# Patient Record
Sex: Female | Born: 1965 | Race: Black or African American | Hispanic: No | Marital: Married | Smoking: Former smoker
Health system: Southern US, Community
[De-identification: ages and names within clinical notes are randomized; demographics above are authoritative.]

## PROBLEM LIST (undated history)

## (undated) DIAGNOSIS — I10 Essential (primary) hypertension: Secondary | ICD-10-CM

## (undated) DIAGNOSIS — Z972 Presence of dental prosthetic device (complete) (partial): Secondary | ICD-10-CM

## (undated) DIAGNOSIS — Z860101 Personal history of adenomatous and serrated colon polyps: Secondary | ICD-10-CM

## (undated) DIAGNOSIS — Z86010 Personal history of colonic polyps: Secondary | ICD-10-CM

## (undated) DIAGNOSIS — Z973 Presence of spectacles and contact lenses: Secondary | ICD-10-CM

## (undated) DIAGNOSIS — K760 Fatty (change of) liver, not elsewhere classified: Secondary | ICD-10-CM

## (undated) DIAGNOSIS — M329 Systemic lupus erythematosus, unspecified: Secondary | ICD-10-CM

## (undated) DIAGNOSIS — M069 Rheumatoid arthritis, unspecified: Secondary | ICD-10-CM

## (undated) DIAGNOSIS — R7989 Other specified abnormal findings of blood chemistry: Secondary | ICD-10-CM

## (undated) HISTORY — DX: Rheumatoid arthritis, unspecified: M06.9

## (undated) HISTORY — DX: Essential (primary) hypertension: I10

## (undated) HISTORY — PX: CHOLECYSTECTOMY: SHX55

---

## 1995-12-30 HISTORY — PX: LAPAROSCOPIC CHOLECYSTECTOMY: SUR755

## 1998-10-29 ENCOUNTER — Other Ambulatory Visit: Admission: RE | Admit: 1998-10-29 | Discharge: 1998-10-29 | Payer: Self-pay | Admitting: Obstetrics and Gynecology

## 1999-09-09 ENCOUNTER — Encounter: Admission: RE | Admit: 1999-09-09 | Discharge: 1999-09-18 | Payer: Self-pay | Admitting: *Deleted

## 1999-11-05 ENCOUNTER — Other Ambulatory Visit: Admission: RE | Admit: 1999-11-05 | Discharge: 1999-11-05 | Payer: Self-pay | Admitting: Obstetrics and Gynecology

## 1999-12-03 ENCOUNTER — Encounter (INDEPENDENT_AMBULATORY_CARE_PROVIDER_SITE_OTHER): Payer: Self-pay | Admitting: Specialist

## 1999-12-03 ENCOUNTER — Other Ambulatory Visit: Admission: RE | Admit: 1999-12-03 | Discharge: 1999-12-03 | Payer: Self-pay | Admitting: Obstetrics and Gynecology

## 2000-01-01 ENCOUNTER — Other Ambulatory Visit: Admission: RE | Admit: 2000-01-01 | Discharge: 2000-01-01 | Payer: Self-pay | Admitting: Obstetrics and Gynecology

## 2000-01-01 ENCOUNTER — Encounter (INDEPENDENT_AMBULATORY_CARE_PROVIDER_SITE_OTHER): Payer: Self-pay

## 2000-04-06 ENCOUNTER — Other Ambulatory Visit: Admission: RE | Admit: 2000-04-06 | Discharge: 2000-04-06 | Payer: Self-pay | Admitting: Obstetrics and Gynecology

## 2000-11-26 ENCOUNTER — Other Ambulatory Visit: Admission: RE | Admit: 2000-11-26 | Discharge: 2000-11-26 | Payer: Self-pay | Admitting: Obstetrics and Gynecology

## 2000-12-17 ENCOUNTER — Other Ambulatory Visit: Admission: RE | Admit: 2000-12-17 | Discharge: 2000-12-17 | Payer: Self-pay | Admitting: Obstetrics and Gynecology

## 2001-02-09 ENCOUNTER — Other Ambulatory Visit: Admission: RE | Admit: 2001-02-09 | Discharge: 2001-02-09 | Payer: Self-pay | Admitting: Obstetrics and Gynecology

## 2001-05-28 ENCOUNTER — Emergency Department (HOSPITAL_COMMUNITY): Admission: EM | Admit: 2001-05-28 | Discharge: 2001-05-28 | Payer: Self-pay | Admitting: Emergency Medicine

## 2001-12-16 ENCOUNTER — Other Ambulatory Visit: Admission: RE | Admit: 2001-12-16 | Discharge: 2001-12-16 | Payer: Self-pay | Admitting: Obstetrics and Gynecology

## 2004-02-01 ENCOUNTER — Other Ambulatory Visit: Admission: RE | Admit: 2004-02-01 | Discharge: 2004-02-01 | Payer: Self-pay | Admitting: Family Medicine

## 2006-08-08 ENCOUNTER — Emergency Department (HOSPITAL_COMMUNITY): Admission: EM | Admit: 2006-08-08 | Discharge: 2006-08-08 | Payer: Self-pay | Admitting: *Deleted

## 2008-09-01 ENCOUNTER — Emergency Department (HOSPITAL_COMMUNITY): Admission: EM | Admit: 2008-09-01 | Discharge: 2008-09-01 | Payer: Self-pay | Admitting: Emergency Medicine

## 2010-12-11 ENCOUNTER — Encounter
Admission: RE | Admit: 2010-12-11 | Discharge: 2010-12-28 | Payer: Self-pay | Source: Home / Self Care | Attending: Internal Medicine | Admitting: Internal Medicine

## 2010-12-28 ENCOUNTER — Encounter: Admission: RE | Admit: 2010-12-28 | Payer: Self-pay | Source: Home / Self Care | Admitting: Internal Medicine

## 2011-01-08 ENCOUNTER — Encounter: Admit: 2011-01-08 | Payer: Self-pay | Admitting: Internal Medicine

## 2011-01-14 ENCOUNTER — Encounter
Admit: 2011-01-14 | Discharge: 2011-01-28 | Payer: Self-pay | Attending: Internal Medicine | Admitting: Internal Medicine

## 2011-01-29 ENCOUNTER — Ambulatory Visit: Payer: Self-pay | Admitting: Rehabilitation

## 2011-01-30 ENCOUNTER — Encounter: Payer: Self-pay | Admitting: Rehabilitation

## 2011-02-04 ENCOUNTER — Ambulatory Visit: Payer: Worker's Compensation | Admitting: Rehabilitation

## 2011-02-06 ENCOUNTER — Encounter: Payer: Worker's Compensation | Admitting: Rehabilitation

## 2016-12-29 DIAGNOSIS — M329 Systemic lupus erythematosus, unspecified: Secondary | ICD-10-CM

## 2016-12-29 DIAGNOSIS — IMO0002 Reserved for concepts with insufficient information to code with codable children: Secondary | ICD-10-CM

## 2016-12-29 HISTORY — DX: Reserved for concepts with insufficient information to code with codable children: IMO0002

## 2016-12-29 HISTORY — DX: Systemic lupus erythematosus, unspecified: M32.9

## 2017-05-08 DIAGNOSIS — R768 Other specified abnormal immunological findings in serum: Secondary | ICD-10-CM | POA: Insufficient documentation

## 2017-05-08 NOTE — Progress Notes (Signed)
Office Visit Note  Patient: Shelley Morrison             Date of Birth: 02-09-1966           MRN: [id number]             PCP: Javier Docker, MD Referring: Javier Docker, MD Visit Date: 05/19/2017 Occupation: CNA-medication tech    Subjective:  New Patient (Initial Visit) and Joint Pain   History of Present Illness: Shelley Morrison is a 51 y.o. female seen in consultation per request of her PCP. According to patient in September 2017 while she was sitting in the sun she developed a rash on her upper extremity which lasted for a while she also developed a rash on her lower extremities. She states 2 weeks after that she started experiencing joint pain all over. The rash eventually resolved. But this stiffness persists. She states last month while she was sitting in the sun she developed some rash over her left forearm. She got some pictures of the rash on her cell phone which appears to be a macular papular rash. She states the rash was pruritic and lasted less than 24 hours. She's been experiencing joint swelling in her bilateral hands, bilateral feet and her ankles. She's describes arthralgias in her shoulder joints, elbow joints, wrist joints, hands, hip joints.  Activities of Daily Living:  Patient reports morning stiffness for all day hours.   Patient Denies nocturnal pain.  Difficulty dressing/grooming: Reports Difficulty climbing stairs: Reports Difficulty getting out of chair: Reports Difficulty using hands for taps, buttons, cutlery, and/or writing: Reports   Review of Systems  Constitutional: Positive for fatigue and weight loss. Negative for night sweats, weight gain and weakness.       Weight loss of 12 pounds in the last 6 months  HENT: Positive for mouth dryness. Negative for mouth sores, trouble swallowing, trouble swallowing and nose dryness.   Eyes: Positive for dryness. Negative for pain, redness and visual disturbance.  Respiratory: Negative for cough, shortness  of breath and difficulty breathing.   Cardiovascular: Positive for hypertension. Negative for chest pain, palpitations, irregular heartbeat and swelling in legs/feet.  Gastrointestinal: Negative for blood in stool, constipation and diarrhea.  Endocrine: Negative for increased urination.  Genitourinary: Negative for vaginal dryness.  Musculoskeletal: Positive for arthralgias, joint pain, joint swelling, myalgias, morning stiffness and myalgias. Negative for muscle weakness and muscle tenderness.  Skin: Positive for sensitivity to sunlight. Negative for color change, hair loss, skin tightness and ulcers.  Allergic/Immunologic: Negative for susceptible to infections.  Neurological: Negative for dizziness, memory loss and night sweats.  Hematological: Negative for swollen glands.  Psychiatric/Behavioral: Positive for depressed mood and sleep disturbance. The patient is nervous/anxious.     PMFS History:  Patient Active Problem List   Diagnosis Date Noted  . Primary insomnia 05/19/2017  . Essential hypertension 05/19/2017  . ANA positive 05/08/2017  . Rheumatoid factor positive 05/08/2017    Past Medical History:  Diagnosis Date  . Hypertension     No family history on file. Past Surgical History:  Procedure Laterality Date  . CHOLECYSTECTOMY     Social History   Social History Narrative  . No narrative on file     Objective: Vital Signs: BP 138/84   Pulse 80   Resp 16   Ht 5' 10.5" (1.791 m)   Wt 196 lb (88.9 kg)   LMP 05/01/2017   BMI 27.73 kg/m    Physical Exam  Constitutional: She is oriented to person, place, and time. She appears well-developed and well-nourished.  HENT:  Head: Normocephalic and atraumatic.  Eyes: Conjunctivae and EOM are normal.  Neck: Normal range of motion.  Cardiovascular: Normal rate, regular rhythm, normal heart sounds and intact distal pulses.   Pulmonary/Chest: Effort normal and breath sounds normal.  Abdominal: Soft. Bowel sounds are  normal.  Lymphadenopathy:    She has no cervical adenopathy.  Neurological: She is alert and oriented to person, place, and time.  Skin: Skin is warm and dry. Capillary refill takes less than 2 seconds.  Psychiatric: She has a normal mood and affect. Her behavior is normal.  Nursing note and vitals reviewed.    Musculoskeletal Exam: C-spine and thoracic spine good range of motion. Lumbar spine limitation of range of motion. She has some discomfort range of motion of her right shoulder tenderness on palpation over her right elbow, she had synovitis on MCPs and PIPs as described below. Hip joints are good range of motion without discomfort she has discomfort range of motion of bilateral knee joints without any warmth swelling or effusion she has tenderness over bilateral ankle joints. No MTP tenderness or synovitis was noted.  CDAI Exam: CDAI Homunculus Exam:   Tenderness:  RUE: glenohumeral and ulnohumeral and radiohumeral Right hand: 2nd MCP, 2nd PIP, 3rd PIP and 4th PIP RLE: tibiofemoral and tibiotalar LLE: tibiotalar  Swelling:  RUE: ulnohumeral and radiohumeral Right hand: 2nd MCP, 2nd PIP, 3rd PIP and 4th PIP RLE: tibiotalar LLE: tibiotalar  Joint Counts:  CDAI Tender Joint count: 7 CDAI Swollen Joint count: 5  Global Assessments:  Patient Global Assessment: 8 Provider Global Assessment: 7  CDAI Calculated Score: 27   Investigation: Findings:  02/04/17 CMP shows elevated Glucose 115, otherwise normal, ESR Elevated 58, RF elevated 406, TSH normal 1.02, ANA positive 1:320 titer homogeneous pattern    Imaging: Xr Foot 2 Views Left  Result Date: 05/19/2017 Right first MTP minimal narrowing no erosive changes were noted. Minimal PIP/DIP narrowing was noted. Inferior calcaneal spur was noted. Impression: These findings are consistent with osteoarthritis of the foo  Xr Foot 2 Views Right  Result Date: 05/19/2017 Right first MTP minimal narrowing no erosive changes were  noted. Minimal PIP/DIP narrowing was noted. Inferior and posterior calcaneal spur was noted. Impression: These findings are consistent with osteoarthritis of the foot  Xr Hand 2 View Left  Result Date: 05/19/2017 Mild juxta articular osteopenia was noted. No MCP joint narrowing or erosive changes were noted. No intercarpal joint space narrowing or erosive changes were noted. She has some PIP narrowing. All CMC narrowing was noted.  Xr Hand 2 View Right  Result Date: 05/19/2017 Mild juxta articular osteopenia was noted. No MCP joint narrowing or erosive changes were noted. No intercarpal joint space narrowing or erosive changes were noted. She has some PIP narrowing. All CMC narrowing was noted.  Xr Knee 3 View Left  Result Date: 05/19/2017 Moderate medial compartment narrowing was noted with medial and intercondylar osteophytes. No chondrocalcinosis was noted. Moderate patellofemoral narrowing was noted. 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 No chondrocalcinosis was noted. Mild patellofemoral narrowing was noted. Impression: Findings are consistent with moderate osteoarthritis and mild chondromalacia patella  Xr Shoulder Right  Result Date: 05/19/2017 No glenohumeral joint space narrowing was noted. A small acromial spur was noted. No  chondrocalcinosis was noted.   Speciality Comments: No specialty comments available.    Procedures:  No procedures performed Allergies: Patient has no known allergies.   Assessment / Plan:     Visit Diagnoses: ANA positive -patient gives history of arthralgia arthritis and photosensitivity. She had active synovitis on examination today. We had detailed discussion regarding possibility of autoimmune disease like lupus. Although she does not be the criteria  at this point. I've also advised her to use sunscreen due to photosensitivity. Plan: CBC with Differential/Platelet, Urinalysis, Routine w reflex microscopic, Cyclic citrul peptide antibody, IgG, [id number] ENA PANEL, C3 and C4, Cardiolipin antibodies, IgG, IgM, IgA, Lupus anticoagulant panel, Beta-2 glycoprotein antibodies, Glucose 6 phosphate dehydrogenase, Serum protein electrophoresis with reflex, IgG, IgA, IgM  Rheumatoid factor positive. She is significantly high positive rheumatoid factor and has active synovitis on examination. I would like to start her on Plaquenil. Indications side effects contraindications were discussed at length. The plan is to start her on Plaquenil 200 mg by mouth twice a day. She will also need baseline ocular examination to monitor for ocular toxicity and then yearly exam. She's been advised to get pneumococcal vaccine and shingles vaccine. - Plan: CBC with Differential/Platelet  Pain in joint, multiple sites - Plan: CBC with Differential/Platelet, Urinalysis, Routine w reflex microscopic, Cyclic citrul peptide antibody, IgG, [id number] ENA PANEL, C3 and C4, Cardiolipin antibodies, IgG, IgM, IgA, Lupus anticoagulant panel, Beta-2 glycoprotein antibodies, Glucose 6 phosphate dehydrogenase, Serum protein electrophoresis with reflex, IgG, IgA, IgM, Hepatitis panel, acute  Chronic right shoulder pain - Plan: XR Shoulder Right  Bilateral hand pain - Plan: XR Hand 2 View Left, XR Hand 2 View Right  Chronic pain of both knees - Plan: XR KNEE 3 VIEW RIGHT, XR KNEE 3 VIEW LEFT  Foot pain, bilateral - Plan: XR Foot 2 Views Left, XR Foot 2 Views Right  Other fatigue - VITAMIN D 25 Hydroxy (Vit-D Deficiency, Fractures)  Primary insomnia  Essential hypertension  Encounter for vitamin deficiency screening - Plan: VITAMIN D 25 Hydroxy (Vit-D Deficiency, Fractures)  Encounter for screening for HIV - Plan: HIV antibody  Need for hepatitis B screening test  Need for hepatitis  C screening test  Screening for blood disease - Plan: Glucose 6 phosphate dehydrogenase  Screening for tuberculosis - Plan: Quantiferon tb gold assay (blood)    Orders: Orders Placed This Encounter  Procedures  . XR Shoulder Right  . XR Hand 2 View Left  . XR Hand 2 View Right  . XR KNEE 3 VIEW RIGHT  . XR KNEE 3 VIEW LEFT  . XR Foot 2 Views Left  . XR Foot 2 Views Right  . CBC with Differential/Platelet  . Urinalysis, Routine w reflex microscopic  . Cyclic citrul peptide antibody, IgG  . [id number] ENA PANEL  . C3 and C4  . Cardiolipin antibodies, IgG, IgM, IgA  . Lupus anticoagulant panel  . Beta-2 glycoprotein antibodies  . Glucose 6 phosphate dehydrogenase  . Serum protein electrophoresis with reflex  . IgG, IgA, IgM  . Quantiferon tb gold assay (blood)  . Hepatitis panel, acute  . VITAMIN D 25 Hydroxy (Vit-D Deficiency, Fractures)  . HIV antibody  . CBC with Differential/Platelet  . COMPLETE METABOLIC PANEL WITH GFR   Meds ordered this encounter  Medications  . hydroxychloroquine (PLAQUENIL) 200 MG tablet    Sig: Take 1 tablet (200 mg total) by mouth 2 (two) times daily.    Dispense:  60 tablet    Refill:  2    Face-to-face time spent with patient was 60 minutes. 50% of time was spent in counseling and coordination of care.  Follow-Up Instructions: No Follow-up on file.   Bo Merino, MD  Note - This record has been created using Editor, commissioning.  Chart creation errors have been sought, but may not always  have been located. Such creation errors do not reflect on  the standard of medical care.

## 2017-05-19 ENCOUNTER — Ambulatory Visit (INDEPENDENT_AMBULATORY_CARE_PROVIDER_SITE_OTHER): Payer: BLUE CROSS/BLUE SHIELD

## 2017-05-19 ENCOUNTER — Ambulatory Visit (INDEPENDENT_AMBULATORY_CARE_PROVIDER_SITE_OTHER): Payer: BLUE CROSS/BLUE SHIELD | Admitting: Rheumatology

## 2017-05-19 ENCOUNTER — Encounter: Payer: Self-pay | Admitting: Rheumatology

## 2017-05-19 VITALS — BP 138/84 | HR 80 | Resp 16 | Ht 70.5 in | Wt 196.0 lb

## 2017-05-19 DIAGNOSIS — M2550 Pain in unspecified joint: Secondary | ICD-10-CM

## 2017-05-19 DIAGNOSIS — M79672 Pain in left foot: Secondary | ICD-10-CM | POA: Diagnosis not present

## 2017-05-19 DIAGNOSIS — M79642 Pain in left hand: Secondary | ICD-10-CM

## 2017-05-19 DIAGNOSIS — M25561 Pain in right knee: Secondary | ICD-10-CM

## 2017-05-19 DIAGNOSIS — Z111 Encounter for screening for respiratory tuberculosis: Secondary | ICD-10-CM

## 2017-05-19 DIAGNOSIS — M79671 Pain in right foot: Secondary | ICD-10-CM

## 2017-05-19 DIAGNOSIS — Z130 Encounter for screening for diseases of the blood and blood-forming organs and certain disorders involving the immune mechanism: Secondary | ICD-10-CM

## 2017-05-19 DIAGNOSIS — M25511 Pain in right shoulder: Secondary | ICD-10-CM | POA: Diagnosis not present

## 2017-05-19 DIAGNOSIS — M79641 Pain in right hand: Secondary | ICD-10-CM

## 2017-05-19 DIAGNOSIS — R768 Other specified abnormal immunological findings in serum: Secondary | ICD-10-CM

## 2017-05-19 DIAGNOSIS — M25562 Pain in left knee: Secondary | ICD-10-CM | POA: Diagnosis not present

## 2017-05-19 DIAGNOSIS — I10 Essential (primary) hypertension: Secondary | ICD-10-CM | POA: Diagnosis not present

## 2017-05-19 DIAGNOSIS — Z79899 Other long term (current) drug therapy: Secondary | ICD-10-CM

## 2017-05-19 DIAGNOSIS — R5383 Other fatigue: Secondary | ICD-10-CM

## 2017-05-19 DIAGNOSIS — Z114 Encounter for screening for human immunodeficiency virus [HIV]: Secondary | ICD-10-CM

## 2017-05-19 DIAGNOSIS — Z1159 Encounter for screening for other viral diseases: Secondary | ICD-10-CM

## 2017-05-19 DIAGNOSIS — G8929 Other chronic pain: Secondary | ICD-10-CM

## 2017-05-19 DIAGNOSIS — F5101 Primary insomnia: Secondary | ICD-10-CM | POA: Insufficient documentation

## 2017-05-19 DIAGNOSIS — Z1321 Encounter for screening for nutritional disorder: Secondary | ICD-10-CM

## 2017-05-19 LAB — CBC WITH DIFFERENTIAL/PLATELET
BASOS PCT: 1 %
Basophils Absolute: 65 cells/uL (ref 0–200)
EOS PCT: 2 %
Eosinophils Absolute: 130 cells/uL (ref 15–500)
HEMATOCRIT: 38.2 % (ref 35.0–45.0)
Hemoglobin: 11.6 g/dL — ABNORMAL LOW (ref 11.7–15.5)
LYMPHS PCT: 36 %
Lymphs Abs: 2340 cells/uL (ref 850–3900)
MCH: 26.1 pg — ABNORMAL LOW (ref 27.0–33.0)
MCHC: 30.4 g/dL — AB (ref 32.0–36.0)
MCV: 86 fL (ref 80.0–100.0)
MONO ABS: 520 {cells}/uL (ref 200–950)
MONOS PCT: 8 %
MPV: 9.7 fL (ref 7.5–12.5)
NEUTROS PCT: 53 %
Neutro Abs: 3445 cells/uL (ref 1500–7800)
PLATELETS: 334 10*3/uL (ref 140–400)
RBC: 4.44 MIL/uL (ref 3.80–5.10)
RDW: 16 % — AB (ref 11.0–15.0)
WBC: 6.5 10*3/uL (ref 3.8–10.8)

## 2017-05-19 MED ORDER — HYDROXYCHLOROQUINE SULFATE 200 MG PO TABS: 200.0000 mg | ORAL_TABLET | Freq: Two times a day (BID) | ORAL | 2 refills | Status: DC

## 2017-05-19 NOTE — Patient Instructions (Addendum)
Dr Corliss Skains wants you to see your primary care about getting pneumonia vaccine and Shingles vaccine. You will need these prior to starting immunosuppression therapy.  You also need to see your eye doctor for your baseline eye exam.    Standing Labs We placed an order today for your standing lab work.    Please come back and get your standing labs in 1 month then in 3 months  We have open lab Monday through Friday from 8:30-11:30 AM and 1:30-4 PM at the office of Dr. Arbutus Ped, PA.   The office is located at [address], [place], [place] [address] No appointment is necessary.   Labs are drawn by [facility].  You may receive a bill from [facility] for your lab work. If you have any questions regarding directions or hours of operation,  please call [phone].    Hydroxychloroquine tablets What is this medicine? HYDROXYCHLOROQUINE (hye drox ee KLOR oh kwin) is used to treat rheumatoid arthritis and systemic lupus erythematosus. It is also used to treat malaria. This medicine may be used for other purposes; ask your health care provider or pharmacist if you have questions. COMMON BRAND NAME(S): Plaquenil, Quineprox What should I tell my health care provider before I take this medicine? They need to know if you have any of these conditions: -diabetes -eye disease, vision problems -G6PD deficiency -history of blood diseases -history of irregular heartbeat -if you often drink alcohol -kidney disease -liver disease -porphyria -psoriasis -seizures -an unusual or allergic reaction to chloroquine, hydroxychloroquine, other medicines, foods, dyes, or preservatives -pregnant or trying to get pregnant -breast-feeding How should I use this medicine? Take this medicine by mouth with a glass of water. Follow the directions on the prescription label. Avoid taking antacids within 4 hours of taking this medicine. It is best to separate these medicines by at least 4  hours. Do not cut, crush or chew this medicine. You can take it with or without food. If it upsets your stomach, take it with food. Take your medicine at regular intervals. Do not take your medicine more often than directed. Take all of your medicine as directed even if you think you are better. Do not skip doses or stop your medicine early. Talk to your pediatrician regarding the use of this medicine in children. While this drug may be prescribed for selected conditions, precautions do apply. Overdosage: If you think you have taken too much of this medicine contact a poison control center or emergency room at once. NOTE: This medicine is only for you. Do not share this medicine with others. What if I miss a dose? If you miss a dose, take it as soon as you can. If it is almost time for your next dose, take only that dose. Do not take double or extra doses. What may interact with this medicine? Do not take this medicine with any of the following medications: -cisapride -dofetilide -dronedarone -live virus vaccines -penicillamine -pimozide -thioridazine -ziprasidone This medicine may also interact with the following medications: -ampicillin -antacids -cimetidine -cyclosporine -digoxin -medicines for diabetes, like insulin, glipizide, glyburide -medicines for seizures like carbamazepine, phenobarbital, phenytoin -mefloquine -methotrexate -other medicines that prolong the QT interval (cause an abnormal heart rhythm) -praziquantel This list may not describe all possible interactions. Give your health care provider a list of all the medicines, herbs, non-prescription drugs, or dietary supplements you use. Also tell them if you smoke, drink alcohol, or use illegal drugs. Some items may interact with your medicine.  What should I watch for while using this medicine? Tell your doctor or healthcare professional if your symptoms do not start to get better or if they get worse. Avoid taking  antacids within 4 hours of taking this medicine. It is best to separate these medicines by at least 4 hours. Tell your doctor or health care professional right away if you have any change in your eyesight. Your vision and blood may be tested before and during use of this medicine. This medicine can make you more sensitive to the sun. Keep out of the sun. If you cannot avoid being in the sun, wear protective clothing and use sunscreen. Do not use sun lamps or tanning beds/booths. What side effects may I notice from receiving this medicine? Side effects that you should report to your doctor or health care professional as soon as possible: -allergic reactions like skin rash, itching or hives, swelling of the face, lips, or tongue -changes in vision -decreased hearing or ringing of the ears -redness, blistering, peeling or loosening of the skin, including inside the mouth -seizures -sensitivity to light -signs and symptoms of a dangerous change in heartbeat or heart rhythm like chest pain; dizziness; fast or irregular heartbeat; palpitations; feeling faint or lightheaded, falls; breathing problems -signs and symptoms of liver injury like dark yellow or brown urine; general ill feeling or flu-like symptoms; light-colored stools; loss of appetite; nausea; right upper belly pain; unusually weak or tired; yellowing of the eyes or skin -signs and symptoms of low blood sugar such as feeling anxious; confusion; dizziness; increased hunger; unusually weak or tired; sweating; shakiness; cold; irritable; headache; blurred vision; fast heartbeat; loss of consciousness -uncontrollable head, mouth, neck, arm, or leg movements Side effects that usually do not require medical attention (report to your doctor or health care professional if they continue or are bothersome): -anxious -diarrhea -dizziness -hair loss -headache -irritable -loss of appetite -nausea, vomiting -stomach pain This list may not describe  all possible side effects. Call your doctor for medical advice about side effects. You may report side effects to FDA at 1-800-FDA-1088. Where should I keep my medicine? Keep out of the reach of children. In children, this medicine can cause overdose with small doses. Store at room temperature between 15 and 30 degrees C (59 and 86 degrees F). Protect from moisture and light. Throw away any unused medicine after the expiration date. NOTE: This sheet is a summary. It may not cover all possible information. If you have questions about this medicine, talk to your doctor, pharmacist, or health care provider.  2018 Elsevier/Gold Standard (2016-07-30 14:16:15)

## 2017-05-19 NOTE — Progress Notes (Signed)
Pharmacy Note  Subjective: Patient presents today to the [facility] to see Dr. Corliss Skainseveshwar.  Patient seen by the pharmacist for counseling on hydroxychloroquine.    Objective: 02/04/17 CMP shows elevated Glucose 115, otherwise normal CBC - ordered today  Assessment/Plan: Patient was prescribed hydroxychloroquine 200 mg twice daily.  Patient was counseled on the purpose, proper use, and adverse effects of hydroxychloroquine including nausea/diarrhea, skin rash, headaches, and sun sensitivity.  Discussed importance of annual eye exams while on hydroxychloroquine to monitor to ocular toxicity and discussed importance of frequent laboratory monitoring.  Provided patient with eye exam form for baseline ophthalmologic exam and standing lab instructions.  Provided patient with educational materials on hydroxychloroquine and answered all questions.  Patient consented to hydroxychloroquine.  Will upload consent in the media tab.    Lilla Shookachel Rona Tomson, Pharm.D., BCPS Clinical Pharmacist Pager: [phone] Phone: [phone] 05/19/2017 9:33 AM

## 2017-05-20 LAB — HEPATITIS PANEL, ACUTE
HCV Ab: NEGATIVE
HEP A IGM: NONREACTIVE
HEP B S AG: NEGATIVE
Hep B C IgM: NONREACTIVE

## 2017-05-20 LAB — GLUCOSE 6 PHOSPHATE DEHYDROGENASE: G-6PDH: 18.6 U/g Hgb (ref 7.0–20.5)

## 2017-05-20 LAB — IGG, IGA, IGM
IgA: 390 mg/dL (ref 81–463)
IgG (Immunoglobin G), Serum: 1984 mg/dL — ABNORMAL HIGH (ref 694–1618)
IgM, Serum: 195 mg/dL (ref 48–271)

## 2017-05-20 LAB — CYCLIC CITRUL PEPTIDE ANTIBODY, IGG

## 2017-05-20 LAB — CARDIOLIPIN ANTIBODIES, IGG, IGM, IGA: Anticardiolipin IgM: <12 [MPL'U]

## 2017-05-20 LAB — CP5000020 ENA PANEL
ENA SM Ab Ser-aCnc: <1
RIBONUCLEIC PROTEIN(ENA) ANTIBODY, IGG: NEGATIVE
SSA (Ro) (ENA) Antibody, IgG: <1
SSB (LA) (ENA) ANTIBODY, IGG: NEGATIVE
Scleroderma (Scl-70) (ENA) Antibody, IgG: <1

## 2017-05-20 LAB — C3 AND C4
C3 Complement: 154 mg/dL (ref 83–193)
C4 COMPLEMENT: 51 mg/dL (ref 15–57)

## 2017-05-20 LAB — HIV ANTIBODY (ROUTINE TESTING W REFLEX): HIV 1&2 Ab, 4th Generation: NONREACTIVE

## 2017-05-20 LAB — VITAMIN D 25 HYDROXY (VIT D DEFICIENCY, FRACTURES): VIT D 25 HYDROXY: 11 ng/mL — AB (ref 30–100)

## 2017-05-21 LAB — RFX PTT-LA W/RFX TO HEX PHASE CONF: PTT-LA Screen: 34 s (ref <=40)

## 2017-05-21 LAB — LUPUS ANTICOAGULANT PANEL

## 2017-05-21 LAB — PROTEIN ELECTROPHORESIS, SERUM, WITH REFLEX
Albumin ELP: 3.6 g/dL — ABNORMAL LOW (ref 3.8–4.8)
Alpha-1-Globulin: 0.4 g/dL — ABNORMAL HIGH (ref 0.2–0.3)
Alpha-2-Globulin: 0.7 g/dL (ref 0.5–0.9)
BETA 2: 0.6 g/dL — AB (ref 0.2–0.5)
BETA GLOBULIN: 0.5 g/dL (ref 0.4–0.6)
Gamma Globulin: 1.8 g/dL — ABNORMAL HIGH (ref 0.8–1.7)
TOTAL PROTEIN, SERUM ELECTROPHOR: 7.7 g/dL (ref 6.1–8.1)

## 2017-05-21 LAB — QUANTIFERON TB GOLD ASSAY (BLOOD)
INTERFERON GAMMA RELEASE ASSAY: NEGATIVE
Mitogen-Nil: 8.93 IU/mL
QUANTIFERON TB AG MINUS NIL: 0.01 [IU]/mL
Quantiferon Nil Value: 0.04 IU/mL

## 2017-05-21 LAB — RFX DRVVT SCR W/RFLX CONF 1:1 MIX: DRVVT SCREEN: 33 s (ref <=45)

## 2017-05-21 LAB — BETA-2 GLYCOPROTEIN ANTIBODIES
Beta-2-Glycoprotein I IgA: <9 SAU (ref <=20)
Beta-2-Glycoprotein I IgM: <9 SMU (ref <=20)

## 2017-05-22 NOTE — Progress Notes (Signed)
Labs are consistent with rheumatoid arthritis. For vitamin D deficiency please call and 50,000 units twice a week for 3 months and check vitamin D levels in 3 months

## 2017-05-27 ENCOUNTER — Telehealth: Payer: Self-pay | Admitting: Rheumatology

## 2017-05-27 ENCOUNTER — Emergency Department (HOSPITAL_COMMUNITY): Payer: BLUE CROSS/BLUE SHIELD

## 2017-05-27 ENCOUNTER — Encounter (HOSPITAL_COMMUNITY): Payer: Self-pay | Admitting: Emergency Medicine

## 2017-05-27 ENCOUNTER — Telehealth: Payer: Self-pay | Admitting: *Deleted

## 2017-05-27 DIAGNOSIS — Z79899 Other long term (current) drug therapy: Secondary | ICD-10-CM | POA: Diagnosis not present

## 2017-05-27 DIAGNOSIS — R0789 Other chest pain: Secondary | ICD-10-CM | POA: Diagnosis not present

## 2017-05-27 DIAGNOSIS — I10 Essential (primary) hypertension: Secondary | ICD-10-CM | POA: Diagnosis not present

## 2017-05-27 DIAGNOSIS — Z87891 Personal history of nicotine dependence: Secondary | ICD-10-CM | POA: Diagnosis not present

## 2017-05-27 LAB — BASIC METABOLIC PANEL
Anion gap: 5 (ref 5–15)
BUN: 7 mg/dL (ref 6–20)
CHLORIDE: 106 mmol/L (ref 101–111)
CO2: 24 mmol/L (ref 22–32)
CREATININE: 0.84 mg/dL (ref 0.44–1.00)
Calcium: 9.2 mg/dL (ref 8.9–10.3)
GFR calc Af Amer: >60 mL/min (ref >60)
GFR calc non Af Amer: >60 mL/min (ref >60)
Glucose, Bld: 115 mg/dL — ABNORMAL HIGH (ref 65–99)
Potassium: 4 mmol/L (ref 3.5–5.1)
SODIUM: 135 mmol/L (ref 135–145)

## 2017-05-27 LAB — CBC
HCT: 34.1 % — ABNORMAL LOW (ref 36.0–46.0)
Hemoglobin: 10.6 g/dL — ABNORMAL LOW (ref 12.0–15.0)
MCH: 26.6 pg (ref 26.0–34.0)
MCHC: 31.1 g/dL (ref 30.0–36.0)
MCV: 85.7 fL (ref 78.0–100.0)
PLATELETS: 322 10*3/uL (ref 150–400)
RBC: 3.98 MIL/uL (ref 3.87–5.11)
RDW: 14.6 % (ref 11.5–15.5)
WBC: 8.5 10*3/uL (ref 4.0–10.5)

## 2017-05-27 LAB — I-STAT TROPONIN, ED: Troponin i, poc: 0.01 ng/mL (ref 0.00–0.08)

## 2017-05-27 MED ORDER — VITAMIN D (ERGOCALCIFEROL) 1.25 MG (50000 UNIT) PO CAPS: 50000.0000 [IU] | ORAL_CAPSULE | ORAL | 0 refills | Status: DC

## 2017-05-27 NOTE — Telephone Encounter (Signed)
Attempted to contact the patient and left message for patient to call the office.  

## 2017-05-27 NOTE — Telephone Encounter (Signed)
-----   Message from Pollyann SavoyShaili Deveshwar, MD sent at 05/22/2017 12:31 PM EDT ----- Labs are consistent with rheumatoid arthritis. For vitamin D deficiency please call and 50,000 units twice a week for 3 months and check vitamin D levels in 3 months

## 2017-05-27 NOTE — ED Triage Notes (Signed)
Pt in reporting central [facility] present X1 day with radiation to R neck and back. Endorses SOB, nausea. Pt recently taken off BP meds 1 wks ago b/c it was interacting with lupus meds.

## 2017-05-27 NOTE — Telephone Encounter (Signed)
Patient returned your call.  CB# [phone].

## 2017-05-27 NOTE — Telephone Encounter (Signed)
Patient left a message stating she was returning a call.  CB# [phone].  Thank you.

## 2017-05-27 NOTE — Telephone Encounter (Signed)
Patient advised of results and prescription sent to the pharmacy.  

## 2017-05-28 ENCOUNTER — Emergency Department (HOSPITAL_COMMUNITY)
Admission: EM | Admit: 2017-05-28 | Discharge: 2017-05-28 | Disposition: A | Discharge status: Home / Self Care | Payer: BLUE CROSS/BLUE SHIELD | Attending: Emergency Medicine | Admitting: Emergency Medicine

## 2017-05-28 DIAGNOSIS — R0789 Other chest pain: Secondary | ICD-10-CM

## 2017-05-28 HISTORY — DX: Systemic lupus erythematosus, unspecified: M32.9

## 2017-05-28 LAB — I-STAT TROPONIN, ED: Troponin i, poc: 0 ng/mL (ref 0.00–0.08)

## 2017-05-28 MED ORDER — NAPROXEN 500 MG PO TABS: ORAL_TABLET | ORAL | 0 refills | Status: DC

## 2017-05-28 MED ORDER — CYCLOBENZAPRINE HCL 5 MG PO TABS: 5.0000 mg | ORAL_TABLET | Freq: Three times a day (TID) | ORAL | 0 refills | Status: DC | PRN

## 2017-05-28 MED ORDER — CYCLOBENZAPRINE HCL 10 MG PO TABS
10.0000 mg | ORAL_TABLET | Freq: Once | ORAL | Status: AC
  Administered 2017-05-28: 10 mg via ORAL
  Filled 2017-05-28: qty 1

## 2017-05-28 MED ORDER — KETOROLAC TROMETHAMINE 60 MG/2ML IM SOLN
60.0000 mg | Freq: Once | INTRAMUSCULAR | Status: AC
  Administered 2017-05-28: 60 mg via INTRAMUSCULAR
  Filled 2017-05-28: qty 2

## 2017-05-28 MED ORDER — KETOROLAC TROMETHAMINE 30 MG/ML IJ SOLN: 30.0000 mg | Freq: Once | INTRAMUSCULAR | Status: DC

## 2017-05-28 NOTE — ED Provider Notes (Signed)
[facility]-EMER[facility]ENCY DEPT Provider Note   CSN: [id number] Arrival date & time: 05/27/17  2111  Time seen 05:38 AM   History   Chief Complaint Chief Complaint  Patient presents with  . Chest Pain    HPI Shelley [facility] is a 51 y.o. female.  HPI  patient states about 6 PM this evening, May 30 she went to work and she started having pain in the center of her chest that she describes as pressure and dull. She states the left about 3 AM and then it returned about 15 minutes ago. She states sometimes the pain radiates into her right neck. She had shortness of breath earlier but not now. She denies diaphoresis. She has had nausea without vomiting. She states she's never had this chest pain before. She states she was diagnosed with lupus about 2 weeks ago and was started on plaque when ill. She states at that time she was having joint and muscle pains, dry mouth and dry eyes, fatigue, thinning hair and a rash. She states her doctor stopped her bysystolic and tribensor last week for her hypotension. Her doctor thought maybe that was causing her lupus. She states her blood pressures are normally 113-125 systolic over 75-60 diastolic.  PCP Pavelock, Duke Salvia, MD   Past Medical History:  Diagnosis Date  . Hypertension   . Lupus     Patient Active Problem List   Diagnosis Date Noted  . Primary insomnia 05/19/2017  . Essential hypertension 05/19/2017  . ANA positive 05/08/2017  . Rheumatoid factor positive 05/08/2017    Past Surgical History:  Procedure Laterality Date  . CHOLECYSTECTOMY      OB History    No data available       Home Medications    Prior to Admission medications   Medication Sig Start Date End Date Taking? Authorizing Provider  hydroxychloroquine (PLAQUENIL) 200 MG tablet Take 1 tablet (200 mg total) by mouth 2 (two) times daily. 05/19/17  Yes Deveshwar, Janalyn Rouse, MD  Olmesartan-Amlodipine-HCTZ 40-5-12.5 MG TABS Take 1 tablet by mouth daily. 02/25/17  Yes [provider]  PREMPRO 0.625-5 MG tablet Take 1 tablet by mouth daily. 05/12/17  Yes [provider]  traZODone (DESYREL) 100 MG tablet Take 100 mg by mouth at bedtime. 05/12/17  Yes [provider]  Vitamin D, Ergocalciferol, (DRISDOL) 50000 units CAPS capsule Take 1 capsule (50,000 Units total) by mouth 2 (two) times a week. 05/28/17  Yes Deveshwar, Janalyn Rouse, MD  cyclobenzaprine (FLEXERIL) 5 MG tablet Take 1 tablet (5 mg total) by mouth 3 (three) times daily as needed for muscle spasms. 05/28/17   Devoria Albe, MD  naproxen (NAPROSYN) 500 MG tablet Take 1 po BID with food prn pain 05/28/17   Devoria Albe, MD    Family History No family history on file.  Social History Social History  Substance Use Topics  . Smoking status: Former Smoker    Start date: 12/30/2011  . Smokeless tobacco: Never Used  . Alcohol use No  employed   Allergies   Patient has no known allergies.   Review of Systems Review of Systems  All other systems reviewed and are negative.    Physical Exam Updated Vital Signs BP 140/82   Pulse (!) 52   Temp 98 F (36.7 C) (Oral)   Resp 15   Ht 5\' 11"  (1.803 m)   Wt 88.5 kg (195 lb)   LMP 05/01/2017   SpO2 98%   BMI 27.20 kg/m   Vital  signs normal except for bradycardia   Physical Exam  Constitutional: She is oriented to person, place, and time. She appears well-developed and well-nourished.  Non-toxic appearance. She does not appear ill. No distress.  HENT:  Head: Normocephalic and atraumatic.  Right Ear: External ear normal.  Left Ear: External ear normal.  Nose: Nose normal. No mucosal edema or rhinorrhea.  Mouth/Throat: Oropharynx is clear and moist and mucous membranes are normal. No dental abscesses or uvula swelling.  Eyes: Conjunctivae and EOM are normal. Pupils are equal, round, and reactive to light.  Neck: Normal range of motion and full passive range of motion without pain. Neck supple.  Cardiovascular: Normal rate, regular rhythm  and normal heart sounds.  Exam reveals no gallop and no friction rub.   No murmur heard. Pulmonary/Chest: Effort normal and breath sounds normal. No respiratory distress. She has no wheezes. She has no rhonchi. She has no rales. She exhibits tenderness. She exhibits no crepitus.    Patient is tender in her right chest and when I do range of motion of her right arm it causes pain in her chest in the same area.  Abdominal: Soft. Normal appearance and bowel sounds are normal. She exhibits no distension. There is no tenderness. There is no rebound and no guarding.  Musculoskeletal: Normal range of motion. She exhibits no edema or tenderness.  Moves all extremities well.   Neurological: She is alert and oriented to person, place, and time. She has normal strength. No cranial nerve deficit.  Skin: Skin is warm, dry and intact. No rash noted. No erythema. No pallor.  Psychiatric: She has a normal mood and affect. Her speech is normal and behavior is normal. Her mood appears not anxious.  Nursing note and vitals reviewed.    ED Treatments / Results  Labs (all labs ordered are listed, but only abnormal results are displayed) Results for orders placed or performed during the hospital encounter of 05/28/17  Basic metabolic panel  Result Value Ref Range   Sodium 135 135 - 145 mmol/L   Potassium 4.0 3.5 - 5.1 mmol/L   Chloride 106 101 - 111 mmol/L   CO2 24 22 - 32 mmol/L   Glucose, Bld 115 (H) 65 - 99 mg/dL   BUN 7 6 - 20 mg/dL   Creatinine, Ser 2.13 0.44 - 1.00 mg/dL   Calcium 9.2 8.9 - 08.6 mg/dL   GFR calc non Af Amer >60 >60 mL/min   GFR calc Af Amer >60 >60 mL/min   Anion gap 5 5 - 15  CBC  Result Value Ref Range   WBC 8.5 4.0 - 10.5 K/uL   RBC 3.98 3.87 - 5.11 MIL/uL   Hemoglobin 10.6 (L) 12.0 - 15.0 g/dL   HCT 57.8 (L) 46.9 - 62.9 %   MCV 85.7 78.0 - 100.0 fL   MCH 26.6 26.0 - 34.0 pg   MCHC 31.1 30.0 - 36.0 g/dL   RDW 52.8 41.3 - 24.4 %   Platelets 322 150 - 400 K/uL  I-stat  troponin, ED  Result Value Ref Range   Troponin i, poc 0.01 0.00 - 0.08 ng/mL   Comment 3          I-stat troponin, ED  Result Value Ref Range   Troponin i, poc 0.00 0.00 - 0.08 ng/mL   Comment 3           Laboratory interpretation all normal except mild anemia    EKG  EKG Interpretation  Date/Time:  Wednesday May 27 2017 21:19:09 EDT Ventricular Rate:  85 PR Interval:  156 QRS Duration: 80 QT Interval:  368 QTC Calculation: 437 R Axis:   47 Text Interpretation:  Normal sinus rhythm Normal ECG No old tracing to compare Confirmed by Devoria AlbeKnapp, Ezequiel Macauley ([id number]) on 05/28/2017 5:15:09 AM Also confirmed by Devoria AlbeKnapp, Thi Klich ([id number]), editor Elita QuickWatlington, Beverly ([id number])  on 05/28/2017 7:21:20 AM       Radiology Dg Chest 2 View  Result Date: 05/27/2017 CLINICAL DATA:  Central chest pain. Shortness of breath with nausea. Former smoker. EXAM: CHEST  2 VIEW COMPARISON:  None. FINDINGS: Heart size upper limits normal. No infiltrates or failure. A rounded density is seen overlying the RIGHT upper lobe which appears to represent an EKG lead. No effusion or pneumothorax. Bones are unremarkable. IMPRESSION: No active cardiopulmonary disease. Electronically Signed   By: Elsie StainJohn T Curnes M.D.   On: 05/27/2017 21:44    Procedures Procedures (including critical care time)  Medications Ordered in ED Medications  cyclobenzaprine (FLEXERIL) tablet 10 mg (10 mg Oral Given 05/28/17 0624)  ketorolac (TORADOL) injection 60 mg (60 mg Intramuscular Given 05/28/17 0626)     Initial Impression / Assessment and Plan / ED Course  I have reviewed the triage vital signs and the nursing notes.  Pertinent labs & imaging results that were available during my care of the patient were reviewed by me and considered in my medical decision making (see chart for details).   Patient was given Toradol IM and oral Flexeril for her chest wall pain.Her troponin was ordered.  Recheck at 7:45 AM patient states her pain is much improved.  Her delta troponin was negative. Her pain appears to be chest wall pain. She was discharged home.  Final Clinical Impressions(s) / ED Diagnoses   Final diagnoses:  Chest wall pain    New Prescriptions New Prescriptions   CYCLOBENZAPRINE (FLEXERIL) 5 MG TABLET    Take 1 tablet (5 mg total) by mouth 3 (three) times daily as needed for muscle spasms.   NAPROXEN (NAPROSYN) 500 MG TABLET    Take 1 po BID with food prn pain   Plan discharge  Devoria AlbeIva Eleesha Purkey, MD, Concha PyoFACEP     Tayleigh Wetherell, MD 05/28/17 [phone]

## 2017-05-28 NOTE — Discharge Instructions (Signed)
Use ice and heat to your painful areas. Take the medications as prescribed.Recheck if you get worse again.

## 2017-05-28 NOTE — ED Notes (Signed)
Pt's family returned to [facility] desk d/t wait times. Pt to be reassessed by triage nurse

## 2017-06-18 DIAGNOSIS — Z79899 Other long term (current) drug therapy: Secondary | ICD-10-CM | POA: Insufficient documentation

## 2017-06-18 DIAGNOSIS — E559 Vitamin D deficiency, unspecified: Secondary | ICD-10-CM | POA: Insufficient documentation

## 2017-06-18 NOTE — Progress Notes (Signed)
Office Visit Note  Patient: Shelley Morrison             Date of Birth: March 29, 1966           MRN: [id number]             PCP: Javier Docker, MD Referring: No ref. provider found Visit Date: 06/22/2017 Occupation: '@GUAROCC'$ @    Subjective:  Pain hands    History of Present Illness: Arloa L Nannini is a 51 y.o. female with history of sero positive rheumatoid arthritis. She's been on Plaquenil for a month now. She states she continues to have pain and swelling in multiple joints. She has noticed minimal improvement with Plaquenil. She was seen in the emergency room room recently for chest pain. Her workup was negative. She was given some Naprosyn which was helpful. She reports pain and swelling in her bilateral hands and her wrists joints. She reports a stiffness and other joints.  Activities of Daily Living:  Patient reports morning stiffness for 1 hour.   Patient Denies nocturnal pain.  Difficulty dressing/grooming: Denies Difficulty climbing stairs: Denies Difficulty getting out of chair: Denies Difficulty using hands for taps, buttons, cutlery, and/or writing: Reports   Review of Systems  Constitutional: Positive for fatigue. Negative for night sweats, weight gain, weight loss and weakness.  HENT: Negative for mouth sores, trouble swallowing, trouble swallowing, mouth dryness and nose dryness.   Eyes: Positive for dryness. Negative for pain, redness and visual disturbance.  Respiratory: Negative for cough, shortness of breath and difficulty breathing.   Cardiovascular: Negative for chest pain, palpitations, hypertension, irregular heartbeat and swelling in legs/feet.  Gastrointestinal: Negative for blood in stool, constipation and diarrhea.  Endocrine: Negative for increased urination.  Genitourinary: Negative for vaginal dryness.  Musculoskeletal: Positive for arthralgias, joint pain, joint swelling and morning stiffness. Negative for myalgias, muscle weakness, muscle tenderness  and myalgias.  Skin: Negative for color change, rash, hair loss, skin tightness, ulcers and sensitivity to sunlight.  Allergic/Immunologic: Negative for susceptible to infections.  Neurological: Negative for dizziness, memory loss and night sweats.  Hematological: Negative for swollen glands.  Psychiatric/Behavioral: Negative for depressed mood and sleep disturbance. The patient is nervous/anxious.     PMFS History:  Patient Active Problem List   Diagnosis Date Noted  . Rheumatoid arthritis with rheumatoid factor of multiple sites without organ or systems involvement ([place]) 06/19/2017  . High risk medication use 06/18/2017  . Vitamin D deficiency 06/18/2017  . Primary insomnia 05/19/2017  . Essential hypertension 05/19/2017  . ANA positive 05/08/2017  . Rheumatoid factor positive 05/08/2017    Past Medical History:  Diagnosis Date  . Hypertension   . Lupus     No family history on file. Past Surgical History:  Procedure Laterality Date  . CHOLECYSTECTOMY     Social History   Social History Narrative  . No narrative on file     Objective: Vital Signs: BP (!) 142/82   Pulse 82   Resp 16   Wt 196 lb (88.9 kg)   LMP 06/22/2017 (Exact Date)   BMI 27.34 kg/m    Physical Exam  Constitutional: She is oriented to person, place, and time. She appears well-developed and well-nourished.  HENT:  Head: Normocephalic and atraumatic.  Eyes: Conjunctivae and EOM are normal.  Neck: Normal range of motion.  Cardiovascular: Normal rate, regular rhythm, normal heart sounds and intact distal pulses.   Pulmonary/Chest: Effort normal and breath sounds normal.  Abdominal: Soft. Bowel sounds are  normal.  Lymphadenopathy:    She has no cervical adenopathy.  Neurological: She is alert and oriented to person, place, and time.  Skin: Skin is warm and dry. Capillary refill takes less than 2 seconds.  Psychiatric: She has a normal mood and affect. Her behavior is normal.  Nursing note and  vitals reviewed.    Musculoskeletal Exam: C-spine and thoracic lumbar spine good range of motion. Shoulder joints elbow joints are good range of motion. She synovitis over MCPs and PIP joints as described below. She is good range of motion of her hip joints knee joints ankles MTPs PIPs DIPs with no synovitis. Except her right ankle joint had mild swelling.  CDAI Exam: CDAI Homunculus Exam:   Tenderness:  RUE: wrist Right hand: 1st MCP, 2nd MCP, 3rd MCP, 4th MCP, 2nd PIP, 3rd PIP, 4th PIP and 5th PIP Left hand: 1st MCP, 2nd MCP, 2nd PIP and 3rd PIP RLE: tibiotalar  Swelling:  Right hand: 2nd MCP, 3rd MCP, 2nd PIP, 3rd PIP and 4th PIP Left hand: 1st MCP, 2nd MCP, 2nd PIP and 3rd PIP RLE: tibiotalar  Joint Counts:  CDAI Tender Joint count: 13 CDAI Swollen Joint count: 9  Global Assessments:  Patient Global Assessment: 7 Provider Global Assessment: 7  CDAI Calculated Score: 36    Investigation: Findings:  05/19/2017 CCP  greater than 250, IgG 1,984 High  IgA 390 , IgM 195,Serum protein electrophoresis with reflex increase in the gamma globulins appears to be polyclonal, suggesting a chronic inflammatory response.  VITAMIN D 25 Hydroxy 11,Beta-2 glycoprotein antibodies Normal, C3 and C4 normal, Cardiolipin antibodies, IgG, IgM, IgA  Normal, ENA PANEL Negative , Glucose 6 phosphate dehydrogenase Normal, Hepatitis panel, acute Negative / Non reactive, HIV antibody Not reactive, Lupus anticoagulant panel  / lupus anticoagulant not detected , and Quantiferon tb gold assay Negative       CBC Latest Ref Rng & Units 05/27/2017 05/19/2017  WBC 4.0 - 10.5 K/uL 8.5 6.5  Hemoglobin 12.0 - 15.0 g/dL 10.6(L) 11.6(L)  Hematocrit 36.0 - 46.0 % 34.1(L) 38.2  Platelets 150 - 400 K/uL 322 334   CMP Latest Ref Rng & Units 05/27/2017  Glucose 65 - 99 mg/dL 115(H)  BUN 6 - 20 mg/dL 7  Creatinine 0.44 - 1.00 mg/dL 0.84  Sodium 135 - 145 mmol/L 135  Potassium 3.5 - 5.1 mmol/L 4.0  Chloride 101 -  111 mmol/L 106  CO2 22 - 32 mmol/L 24  Calcium 8.9 - 10.3 mg/dL 9.2    Imaging: Dg Chest 2 View  Result Date: 05/27/2017 CLINICAL DATA:  Central chest pain. Shortness of breath with nausea. Former smoker. EXAM: CHEST  2 VIEW COMPARISON:  None. FINDINGS: Heart size upper limits normal. No infiltrates or failure. A rounded density is seen overlying the RIGHT upper lobe which appears to represent an EKG lead. No effusion or pneumothorax. Bones are unremarkable. IMPRESSION: No active cardiopulmonary disease. Electronically Signed   By: Staci Righter M.D.   On: 05/27/2017 21:44    Speciality Comments: No specialty comments available.    Procedures:  No procedures performed Allergies: Patient has no known allergies.   Assessment / Plan:     Visit Diagnoses: Seropositive rheumatoid arthritis of multiple sites ([place]) - RF 406,anti-CCP>250, ANA 1:320, ENA-, elevated ESR, synovitisInvolving multiple joints. She continues to have active disease despite of Plaquenil. She has quite aggressive nature of her arthritis. We had detailed discussion regarding different treatment options and their side effects. I believe she will do  better on methotrexate and Plaquenil combination. Indications side effects contraindications were discussed. She states she's not sexually active. We will proceed with methotrexate 6 tablets by mouth every week along with folic acid. If her labs are stable I'll increase it to 8 tablets by mouth every week. Folic acid 2 mg by mouth daily was be given. We will check her labs every 2 weeks 2 and then every 2 months to monitor for drug toxicity.  High risk medication use - Plaquenil 200 mg po bid - Plan: CBC with Differential/Platelet, COMPLETE METABOLIC PANEL WITH GFR  ANA positive:. Panel was negative.  Primary insomnia: Better on trazodone  Vitamin D deficiency - 5/18 Vit D 11: She is on vitamin D supplement  History of hypertension: Her blood pressure is still elevated at  advised to monitor blood pressure closely and follow up with her PCP.  High risk medication use - Plan: CBC with Differential/Platelet, COMPLETE METABOLIC PANEL WITH GFR    Orders: Orders Placed This Encounter  Procedures  . CBC with Differential/Platelet  . COMPLETE METABOLIC PANEL WITH GFR   Meds ordered this encounter  Medications  . folic acid (FOLVITE) 1 MG tablet    Sig: Take 2 tablets (2 mg total) by mouth daily.    Dispense:  180 tablet    Refill:  3  . methotrexate (RHEUMATREX) 2.5 MG tablet    Sig: Take 6 tablets (15 mg total) by mouth once a week. For two weeks, then increase dose to 8 tablets by mouth weekly. Caution:Chemotherapy. Protect from light.    Dispense:  28 tablet    Refill:  0    Face-to-face time spent with patient was 40 minutes. 50% of time was spent in counseling and coordination of care.  Follow-Up Instructions: Return in about 3 months (around 09/22/2017) for Rheumatoid arthritis.   Bo Merino, MD  Note - This record has been created using Editor, commissioning.  Chart creation errors have been sought, but may not always  have been located. Such creation errors do not reflect on  the standard of medical care.

## 2017-06-19 DIAGNOSIS — M0579 Rheumatoid arthritis with rheumatoid factor of multiple sites without organ or systems involvement: Secondary | ICD-10-CM | POA: Insufficient documentation

## 2017-06-22 ENCOUNTER — Encounter: Payer: Self-pay | Admitting: Rheumatology

## 2017-06-22 ENCOUNTER — Ambulatory Visit (INDEPENDENT_AMBULATORY_CARE_PROVIDER_SITE_OTHER): Payer: BLUE CROSS/BLUE SHIELD | Admitting: Rheumatology

## 2017-06-22 VITALS — BP 142/82 | HR 82 | Resp 16 | Wt 196.0 lb

## 2017-06-22 DIAGNOSIS — Z8679 Personal history of other diseases of the circulatory system: Secondary | ICD-10-CM

## 2017-06-22 DIAGNOSIS — F5101 Primary insomnia: Secondary | ICD-10-CM

## 2017-06-22 DIAGNOSIS — Z79899 Other long term (current) drug therapy: Secondary | ICD-10-CM

## 2017-06-22 DIAGNOSIS — E559 Vitamin D deficiency, unspecified: Secondary | ICD-10-CM

## 2017-06-22 DIAGNOSIS — M0579 Rheumatoid arthritis with rheumatoid factor of multiple sites without organ or systems involvement: Secondary | ICD-10-CM

## 2017-06-22 DIAGNOSIS — R768 Other specified abnormal immunological findings in serum: Secondary | ICD-10-CM

## 2017-06-22 LAB — CBC WITH DIFFERENTIAL/PLATELET
BASOS ABS: 64 {cells}/uL (ref 0–200)
Basophils Relative: 1 %
EOS PCT: 2 %
Eosinophils Absolute: 128 cells/uL (ref 15–500)
HEMATOCRIT: 35.6 % (ref 35.0–45.0)
HEMOGLOBIN: 10.9 g/dL — AB (ref 11.7–15.5)
LYMPHS ABS: 2688 {cells}/uL (ref 850–3900)
Lymphocytes Relative: 42 %
MCH: 25.7 pg — ABNORMAL LOW (ref 27.0–33.0)
MCHC: 30.6 g/dL — ABNORMAL LOW (ref 32.0–36.0)
MCV: 84 fL (ref 80.0–100.0)
MONO ABS: 640 {cells}/uL (ref 200–950)
MPV: 9.3 fL (ref 7.5–12.5)
Monocytes Relative: 10 %
NEUTROS ABS: 2880 {cells}/uL (ref 1500–7800)
Neutrophils Relative %: 45 %
Platelets: 350 10*3/uL (ref 140–400)
RBC: 4.24 MIL/uL (ref 3.80–5.10)
RDW: 14.7 % (ref 11.0–15.0)
WBC: 6.4 10*3/uL (ref 3.8–10.8)

## 2017-06-22 MED ORDER — FOLIC ACID 1 MG PO TABS: 2.0000 mg | ORAL_TABLET | Freq: Every day | ORAL | 3 refills | Status: DC

## 2017-06-22 MED ORDER — METHOTREXATE 2.5 MG PO TABS: 15.0000 mg | ORAL_TABLET | ORAL | 0 refills | Status: DC

## 2017-06-22 NOTE — Progress Notes (Signed)
Pharmacy Note  Subjective: Patient presents today to the [facility] to see Dr. Corliss Skainseveshwar for follow up.  She was started on hydroxychloroquine 200 mg twice daily on 05/19/17.    Does the patient feel that his/her medications are working for him/her?  Yes, patient reports some improvement in her symptoms since starting hydroxychloroquine Has the patient been experiencing any side effects to the medications prescribed?  No Does the patient have any problems obtaining medications?  No   Decision was made to start patient on methotrexate.  Patient seen by the pharmacist for counseling on methotrexate.     Objective: CBC    Component Value Date/Time   WBC 8.5 05/27/2017 2124   RBC 3.98 05/27/2017 2124   HGB 10.6 (L) 05/27/2017 2124   HCT 34.1 (L) 05/27/2017 2124   PLT 322 05/27/2017 2124   MCV 85.7 05/27/2017 2124   MCH 26.6 05/27/2017 2124   MCHC 31.1 05/27/2017 2124   RDW 14.6 05/27/2017 2124   LYMPHSABS 2,340 05/19/2017 0903   MONOABS 520 05/19/2017 0903   EOSABS 130 05/19/2017 0903   BASOSABS 65 05/19/2017 0903   CMP     Component Value Date/Time   NA 135 05/27/2017 2124   K 4.0 05/27/2017 2124   CL 106 05/27/2017 2124   CO2 24 05/27/2017 2124   GLUCOSE 115 (H) 05/27/2017 2124   BUN 7 05/27/2017 2124   CREATININE 0.84 05/27/2017 2124   CALCIUM 9.2 05/27/2017 2124   GFRNONAA >60 05/27/2017 2124   GFRAA >60 05/27/2017 2124   TB Gold: negative (05/19/17) Hepatitis panel: negative (05/19/17) HIV: negative (05/19/17)  Chest-xray:  05/27/17: "IMPRESSION: No active cardiopulmonary disease."  Contraception: discussed  Alcohol use: none  Assessment/Plan:  Patient was initiated on methotrexate.  Patient was counseled on the purpose, proper use, and adverse effects of methotrexate including nausea, infection, and signs and symptoms of pneumonitis.  Reviewed instructions with patient to take methotrexate weekly along with folic acid daily.  Discussed the  importance of frequent monitoring of kidney and liver function and blood counts, and provided patient with standing lab instructions.  Counseled patient to avoid NSAIDs and alcohol while on methotrexate.  Provided patient with educational materials on methotrexate and answered all questions.  Advised patient to get annual influenza vaccine and to get a pneumococcal vaccine if patient has not already had one.  Patient voiced understanding.  Patient consented to methotrexate use.  Will upload into chart.    Patient had hydroxychloroquine eye exam on 06/17/17 which was normal.  She was advised to follow up with repeat eye exam in 6 months and patient confirms she is scheduled for follow up eye exam in December 2018.    Lilla Shookachel Henderson, Pharm.D., BCPS Clinical Pharmacist Pager: [phone] Phone: [phone] 06/22/2017 10:45 AM

## 2017-06-22 NOTE — Patient Instructions (Addendum)
We recommend getting a pneumonia vaccine.  Please discuss this with your primary care provider.  Start taking methotrexate 6 tablets (15 mg) by mouth weekly for two weeks.  Get labs 2 weeks after starting the medication.  If your labs are stable, we will plan to increase methotrexate dose to 8 tablets by mouth weekly.  Please get labs again two weeks after increasing your dose.   Start taking folic acid 2 mg by mouth daily.    Standing Labs We placed an order today for your standing lab work.    Please come back and get your standing labs in 2 weeks x2 and every 2 months  We have open lab Monday through Friday from 8:30-11:30 AM and 1:30-4 PM at the office of Dr. Arbutus Ped, PA.   The office is located at [address], [address], [place], [place] [address] No appointment is necessary.   Labs are drawn by [facility].  You may receive a bill from [facility] for your lab work. If you have any questions regarding directions or hours of operation,  please call [phone].    Methotrexate tablets What is this medicine? METHOTREXATE (METH oh TREX ate) is a chemotherapy drug used to treat cancer including breast cancer, leukemia, and lymphoma. This medicine can also be used to treat psoriasis and certain kinds of arthritis. This medicine may be used for other purposes; ask your health care provider or pharmacist if you have questions. COMMON BRAND NAME(S): Rheumatrex, Trexall What should I tell my health care provider before I take this medicine? They need to know if you have any of these conditions: -fluid in the stomach area or lungs -if you often drink alcohol -infection or immune system problems -kidney disease or on hemodialysis -liver disease -low blood counts, like low Tolley cell, platelet, or red cell counts -lung disease -radiation therapy -stomach ulcers -ulcerative colitis -an unusual or allergic reaction to methotrexate, other medicines, foods, dyes, or  preservatives -pregnant or trying to get pregnant -breast-feeding How should I use this medicine? Take this medicine by mouth with a glass of water. Follow the directions on the prescription label. Take your medicine at regular intervals. Do not take it more often than directed. Do not stop taking except on your doctor's advice. Make sure you know why you are taking this medicine and how often you should take it. If this medicine is used for a condition that is not cancer, like arthritis or psoriasis, it should be taken weekly, NOT daily. Taking this medicine more often than directed can cause serious side effects, even death. Talk to your healthcare provider about safe handling and disposal of this medicine. You may need to take special precautions. Talk to your pediatrician regarding the use of this medicine in children. While this drug may be prescribed for selected conditions, precautions do apply. Overdosage: If you think you have taken too much of this medicine contact a poison control center or emergency room at once. NOTE: This medicine is only for you. Do not share this medicine with others. What if I miss a dose? If you miss a dose, talk with your doctor or health care professional. Do not take double or extra doses. What may interact with this medicine? This medicine may interact with the following medication: -acitretin -aspirin and aspirin-like medicines including salicylates -azathioprine -certain antibiotics like penicillins, tetracycline, and chloramphenicol -cyclosporine -gold -hydroxychloroquine -live virus vaccines -NSAIDs, medicines for pain and inflammation, like ibuprofen or naproxen -other cytotoxic agents -penicillamine -phenylbutazone -  phenytoin -probenecid -retinoids such as isotretinoin and tretinoin -steroid medicines like prednisone or cortisone -sulfonamides like sulfasalazine and trimethoprim/sulfamethoxazole -theophylline This list may not describe all  possible interactions. Give your health care provider a list of all the medicines, herbs, non-prescription drugs, or dietary supplements you use. Also tell them if you smoke, drink alcohol, or use illegal drugs. Some items may interact with your medicine. What should I watch for while using this medicine? Avoid alcoholic drinks. This medicine can make you more sensitive to the sun. Keep out of the sun. If you cannot avoid being in the sun, wear protective clothing and use sunscreen. Do not use sun lamps or tanning beds/booths. You may need blood work done while you are taking this medicine. Call your doctor or health care professional for advice if you get a fever, chills or sore throat, or other symptoms of a cold or flu. Do not treat yourself. This drug decreases your body's ability to fight infections. Try to avoid being around people who are sick. This medicine may increase your risk to bruise or bleed. Call your doctor or health care professional if you notice any unusual bleeding. Check with your doctor or health care professional if you get an attack of severe diarrhea, nausea and vomiting, or if you sweat a lot. The loss of too much body fluid can make it dangerous for you to take this medicine. Talk to your doctor about your risk of cancer. You may be more at risk for certain types of cancers if you take this medicine. Both men and women must use effective birth control with this medicine. Do not become pregnant while taking this medicine or until at least 1 normal menstrual cycle has occurred after stopping it. Women should inform their doctor if they wish to become pregnant or think they might be pregnant. Men should not father a child while taking this medicine and for 3 months after stopping it. There is a potential for serious side effects to an unborn child. Talk to your health care professional or pharmacist for more information. Do not breast-feed an infant while taking this medicine. What  side effects may I notice from receiving this medicine? Side effects that you should report to your doctor or health care professional as soon as possible: -allergic reactions like skin rash, itching or hives, swelling of the face, lips, or tongue -breathing problems or shortness of breath -diarrhea -dry, nonproductive cough -low blood counts - this medicine may decrease the number of Csaszar blood cells, red blood cells and platelets. You may be at increased risk for infections and bleeding. -mouth sores -redness, blistering, peeling or loosening of the skin, including inside the mouth -signs of infection - fever or chills, cough, sore throat, pain or trouble passing urine -signs and symptoms of bleeding such as bloody or black, tarry stools; red or dark-brown urine; spitting up blood or brown material that looks like coffee grounds; red spots on the skin; unusual bruising or bleeding from the eye, gums, or nose -signs and symptoms of kidney injury like trouble passing urine or change in the amount of urine -signs and symptoms of liver injury like dark yellow or brown urine; general ill feeling or flu-like symptoms; light-colored stools; loss of appetite; nausea; right upper belly pain; unusually weak or tired; yellowing of the eyes or skin Side effects that usually do not require medical attention (report to your doctor or health care professional if they continue or are bothersome): -dizziness -hair loss -tiredness -  upset stomach -vomiting This list may not describe all possible side effects. Call your doctor for medical advice about side effects. You may report side effects to FDA at 1-800-FDA-1088. Where should I keep my medicine? Keep out of the reach of children. Store at room temperature between 20 and 25 degrees C (68 and 77 degrees F). Protect from light. Throw away any unused medicine after the expiration date. NOTE: This sheet is a summary. It may not cover all possible information.  If you have questions about this medicine, talk to your doctor, pharmacist, or health care provider.  2018 Elsevier/Gold Standard (2015-08-20 05:39:22)

## 2017-06-23 LAB — COMPLETE METABOLIC PANEL WITH GFR
ALBUMIN: 3.6 g/dL (ref 3.6–5.1)
ALK PHOS: 46 U/L (ref 33–130)
ALT: 15 U/L (ref 6–29)
AST: 17 U/L (ref 10–35)
BILIRUBIN TOTAL: 0.4 mg/dL (ref 0.2–1.2)
BUN: 8 mg/dL (ref 7–25)
CALCIUM: 9.2 mg/dL (ref 8.6–10.4)
CO2: 22 mmol/L (ref 20–31)
CREATININE: 0.82 mg/dL (ref 0.50–1.05)
Chloride: 104 mmol/L (ref 98–110)
GFR, Est African American: >89 mL/min (ref >=60)
GFR, Est Non African American: 84 mL/min (ref >=60)
Glucose, Bld: 93 mg/dL (ref 65–99)
Potassium: 4.2 mmol/L (ref 3.5–5.3)
Sodium: 135 mmol/L (ref 135–146)
TOTAL PROTEIN: 7.3 g/dL (ref 6.1–8.1)

## 2017-06-23 NOTE — Progress Notes (Signed)
Labs are stable.

## 2017-07-08 ENCOUNTER — Other Ambulatory Visit: Payer: Self-pay | Admitting: Radiology

## 2017-07-08 DIAGNOSIS — Z79899 Other long term (current) drug therapy: Secondary | ICD-10-CM

## 2017-07-08 LAB — CBC WITH DIFFERENTIAL/PLATELET
BASOS ABS: 57 {cells}/uL (ref 0–200)
Basophils Relative: 1 %
EOS PCT: 2 %
Eosinophils Absolute: 114 cells/uL (ref 15–500)
HEMATOCRIT: 35.5 % (ref 35.0–45.0)
Hemoglobin: 11.2 g/dL — ABNORMAL LOW (ref 11.7–15.5)
LYMPHS PCT: 52 %
Lymphs Abs: 2964 cells/uL (ref 850–3900)
MCH: 26.9 pg — AB (ref 27.0–33.0)
MCHC: 31.5 g/dL — AB (ref 32.0–36.0)
MCV: 85.1 fL (ref 80.0–100.0)
MONO ABS: 513 {cells}/uL (ref 200–950)
MPV: 9.3 fL (ref 7.5–12.5)
Monocytes Relative: 9 %
NEUTROS PCT: 36 %
Neutro Abs: 2052 cells/uL (ref 1500–7800)
Platelets: 349 10*3/uL (ref 140–400)
RBC: 4.17 MIL/uL (ref 3.80–5.10)
RDW: 14.5 % (ref 11.0–15.0)
WBC: 5.7 10*3/uL (ref 3.8–10.8)

## 2017-07-09 ENCOUNTER — Telehealth: Payer: Self-pay | Admitting: Radiology

## 2017-07-09 LAB — COMPLETE METABOLIC PANEL WITH GFR
ALK PHOS: 53 U/L (ref 33–130)
ALT: 16 U/L (ref 6–29)
AST: 18 U/L (ref 10–35)
Albumin: 3.7 g/dL (ref 3.6–5.1)
BUN: 7 mg/dL (ref 7–25)
CALCIUM: 9.5 mg/dL (ref 8.6–10.4)
CHLORIDE: 105 mmol/L (ref 98–110)
CO2: 21 mmol/L (ref 20–31)
Creat: 0.93 mg/dL (ref 0.50–1.05)
GFR, EST NON AFRICAN AMERICAN: 72 mL/min (ref >=60)
GFR, Est African American: 83 mL/min (ref >=60)
Glucose, Bld: 102 mg/dL — ABNORMAL HIGH (ref 65–99)
POTASSIUM: 4.2 mmol/L (ref 3.5–5.3)
Sodium: 136 mmol/L (ref 135–146)
Total Bilirubin: 0.2 mg/dL (ref 0.2–1.2)
Total Protein: 7.7 g/dL (ref 6.1–8.1)

## 2017-07-09 NOTE — Telephone Encounter (Signed)
I have called patient to advise labs are stable  

## 2017-07-09 NOTE — Progress Notes (Signed)
Labs are stable.

## 2017-07-09 NOTE — Telephone Encounter (Signed)
-----   Message from Pollyann SavoyShaili Deveshwar, MD sent at 07/09/2017  8:31 AM EDT ----- Labs are stable

## 2017-07-09 NOTE — Telephone Encounter (Signed)
I have called patient to advise labs are stable, left message asked her to call back if she has questions, ok to continue med /increase as planned

## 2017-07-14 ENCOUNTER — Other Ambulatory Visit: Payer: Self-pay | Admitting: Rheumatology

## 2017-07-14 NOTE — Telephone Encounter (Signed)
Last Visit: 06/22/17 Next Visit: 09/22/17 Labs: 07/08/17 Stable  Okay to refill per Dr. Corliss Skainseveshwar

## 2017-07-22 ENCOUNTER — Other Ambulatory Visit: Payer: Self-pay

## 2017-07-22 DIAGNOSIS — Z79899 Other long term (current) drug therapy: Secondary | ICD-10-CM

## 2017-07-22 LAB — COMPLETE METABOLIC PANEL WITH GFR
ALT: 18 U/L (ref 6–29)
AST: 18 U/L (ref 10–35)
Albumin: 3.5 g/dL — ABNORMAL LOW (ref 3.6–5.1)
Alkaline Phosphatase: 51 U/L (ref 33–130)
BILIRUBIN TOTAL: 0.2 mg/dL (ref 0.2–1.2)
BUN: 7 mg/dL (ref 7–25)
CHLORIDE: 106 mmol/L (ref 98–110)
CO2: 23 mmol/L (ref 20–31)
CREATININE: 0.82 mg/dL (ref 0.50–1.05)
Calcium: 9.1 mg/dL (ref 8.6–10.4)
GFR, Est Non African American: 84 mL/min (ref >=60)
GLUCOSE: 79 mg/dL (ref 65–99)
Potassium: 4.1 mmol/L (ref 3.5–5.3)
SODIUM: 137 mmol/L (ref 135–146)
TOTAL PROTEIN: 7.3 g/dL (ref 6.1–8.1)

## 2017-07-22 LAB — CBC WITH DIFFERENTIAL/PLATELET
BASOS PCT: 1 %
Basophils Absolute: 61 cells/uL (ref 0–200)
EOS PCT: 3 %
Eosinophils Absolute: 183 cells/uL (ref 15–500)
HCT: 33 % — ABNORMAL LOW (ref 35.0–45.0)
Hemoglobin: 10.6 g/dL — ABNORMAL LOW (ref 11.7–15.5)
LYMPHS PCT: 48 %
Lymphs Abs: 2928 cells/uL (ref 850–3900)
MCH: 27.2 pg (ref 27.0–33.0)
MCHC: 32.1 g/dL (ref 32.0–36.0)
MCV: 84.8 fL (ref 80.0–100.0)
MONOS PCT: 9 %
MPV: 9.8 fL (ref 7.5–12.5)
Monocytes Absolute: 549 cells/uL (ref 200–950)
NEUTROS ABS: 2379 {cells}/uL (ref 1500–7800)
Neutrophils Relative %: 39 %
PLATELETS: 336 10*3/uL (ref 140–400)
RBC: 3.89 MIL/uL (ref 3.80–5.10)
RDW: 15 % (ref 11.0–15.0)
WBC: 6.1 10*3/uL (ref 3.8–10.8)

## 2017-07-23 NOTE — Progress Notes (Signed)
Anemia, labs are stable

## 2017-08-03 ENCOUNTER — Other Ambulatory Visit: Payer: Self-pay

## 2017-08-03 DIAGNOSIS — Z79899 Other long term (current) drug therapy: Secondary | ICD-10-CM

## 2017-08-03 LAB — CBC WITH DIFFERENTIAL/PLATELET
BASOS ABS: 56 {cells}/uL (ref 0–200)
Basophils Relative: 1 %
EOS PCT: 2 %
Eosinophils Absolute: 112 cells/uL (ref 15–500)
HCT: 33.4 % — ABNORMAL LOW (ref 35.0–45.0)
HEMOGLOBIN: 10.4 g/dL — AB (ref 11.7–15.5)
LYMPHS ABS: 2520 {cells}/uL (ref 850–3900)
LYMPHS PCT: 45 %
MCH: 26.5 pg — AB (ref 27.0–33.0)
MCHC: 31.1 g/dL — AB (ref 32.0–36.0)
MCV: 85.2 fL (ref 80.0–100.0)
MONOS PCT: 10 %
MPV: 9.7 fL (ref 7.5–12.5)
Monocytes Absolute: 560 cells/uL (ref 200–950)
Neutro Abs: 2352 cells/uL (ref 1500–7800)
Neutrophils Relative %: 42 %
Platelets: 312 10*3/uL (ref 140–400)
RBC: 3.92 MIL/uL (ref 3.80–5.10)
RDW: 15.1 % — AB (ref 11.0–15.0)
WBC: 5.6 10*3/uL (ref 3.8–10.8)

## 2017-08-04 LAB — COMPLETE METABOLIC PANEL WITH GFR
ALT: 14 U/L (ref 6–29)
AST: 16 U/L (ref 10–35)
Albumin: 3.5 g/dL — ABNORMAL LOW (ref 3.6–5.1)
Alkaline Phosphatase: 43 U/L (ref 33–130)
BUN: 9 mg/dL (ref 7–25)
CHLORIDE: 106 mmol/L (ref 98–110)
CO2: 22 mmol/L (ref 20–32)
Calcium: 9 mg/dL (ref 8.6–10.4)
Creat: 0.92 mg/dL (ref 0.50–1.05)
GFR, EST NON AFRICAN AMERICAN: 73 mL/min (ref >=60)
GFR, Est African American: 84 mL/min (ref >=60)
GLUCOSE: 91 mg/dL (ref 65–99)
POTASSIUM: 4.1 mmol/L (ref 3.5–5.3)
SODIUM: 136 mmol/L (ref 135–146)
Total Bilirubin: 0.2 mg/dL (ref 0.2–1.2)
Total Protein: 7 g/dL (ref 6.1–8.1)

## 2017-08-04 NOTE — Progress Notes (Signed)
Anemia, rest are WNL

## 2017-08-05 ENCOUNTER — Ambulatory Visit: Payer: BLUE CROSS/BLUE SHIELD | Admitting: Rheumatology

## 2017-08-11 ENCOUNTER — Other Ambulatory Visit: Payer: Self-pay | Admitting: Rheumatology

## 2017-08-12 NOTE — Telephone Encounter (Signed)
06/22/17 last visit  09/22/17 next visit    CBC Latest Ref Rng & Units 08/03/2017 07/22/2017 07/08/2017  WBC 3.8 - 10.8 K/uL 5.6 6.1 5.7  Hemoglobin 11.7 - 15.5 g/dL 10.4(L) 10.6(L) 11.2(L)  Hematocrit 35.0 - 45.0 % 33.4(L) 33.0(L) 35.5  Platelets 140 - 400 K/uL 312 336 349   CMP Latest Ref Rng & Units 08/03/2017 07/22/2017 07/08/2017  Glucose 65 - 99 mg/dL 91 79 098(J102(H)  BUN 7 - 25 mg/dL 9 7 7   Creatinine 0.50 - 1.05 mg/dL 1.910.92 4.780.82 2.950.93  Sodium 135 - 146 mmol/L 136 137 136  Potassium 3.5 - 5.3 mmol/L 4.1 4.1 4.2  Chloride 98 - 110 mmol/L 106 106 105  CO2 20 - 32 mmol/L 22 23 21   Calcium 8.6 - 10.4 mg/dL 9.0 9.1 9.5  Total Protein 6.1 - 8.1 g/dL 7.0 7.3 7.7  Total Bilirubin 0.2 - 1.2 mg/dL 0.2 0.2 0.2  Alkaline Phos 33 - 130 U/L 43 51 53  AST 10 - 35 U/L 16 18 18   ALT 6 - 29 U/L 14 18 16    eye exam normal on 06/17/17  Ok to refill per Dr Corliss Skainseveshwar

## 2017-08-24 ENCOUNTER — Other Ambulatory Visit: Payer: Self-pay | Admitting: *Deleted

## 2017-08-24 DIAGNOSIS — Z79899 Other long term (current) drug therapy: Secondary | ICD-10-CM

## 2017-08-24 LAB — CBC WITH DIFFERENTIAL/PLATELET
Basophils Absolute: 45 cells/uL (ref 0–200)
Basophils Relative: 1 %
EOS PCT: 2 %
Eosinophils Absolute: 90 cells/uL (ref 15–500)
HEMATOCRIT: 34.7 % — AB (ref 35.0–45.0)
Hemoglobin: 10.9 g/dL — ABNORMAL LOW (ref 11.7–15.5)
LYMPHS PCT: 47 %
Lymphs Abs: 2115 cells/uL (ref 850–3900)
MCH: 26.8 pg — ABNORMAL LOW (ref 27.0–33.0)
MCHC: 31.4 g/dL — AB (ref 32.0–36.0)
MCV: 85.3 fL (ref 80.0–100.0)
MONOS PCT: 10 %
MPV: 9.8 fL (ref 7.5–12.5)
Monocytes Absolute: 450 cells/uL (ref 200–950)
NEUTROS PCT: 40 %
Neutro Abs: 1800 cells/uL (ref 1500–7800)
PLATELETS: 321 10*3/uL (ref 140–400)
RBC: 4.07 MIL/uL (ref 3.80–5.10)
RDW: 15.7 % — AB (ref 11.0–15.0)
WBC: 4.5 10*3/uL (ref 3.8–10.8)

## 2017-08-25 LAB — COMPLETE METABOLIC PANEL WITH GFR
ALT: 16 U/L (ref 6–29)
AST: 16 U/L (ref 10–35)
Albumin: 3.7 g/dL (ref 3.6–5.1)
Alkaline Phosphatase: 45 U/L (ref 33–130)
BILIRUBIN TOTAL: 0.3 mg/dL (ref 0.2–1.2)
BUN: 7 mg/dL (ref 7–25)
CALCIUM: 9.1 mg/dL (ref 8.6–10.4)
CHLORIDE: 106 mmol/L (ref 98–110)
CO2: 23 mmol/L (ref 20–32)
CREATININE: 0.82 mg/dL (ref 0.50–1.05)
GFR, Est African American: >89 mL/min (ref >=60)
GFR, Est Non African American: 84 mL/min (ref >=60)
GLUCOSE: 85 mg/dL (ref 65–99)
POTASSIUM: 4.4 mmol/L (ref 3.5–5.3)
Sodium: 137 mmol/L (ref 135–146)
Total Protein: 7 g/dL (ref 6.1–8.1)

## 2017-08-25 NOTE — Progress Notes (Signed)
Mild anemia

## 2017-08-27 ENCOUNTER — Telehealth: Payer: Self-pay | Admitting: Rheumatology

## 2017-08-27 NOTE — Telephone Encounter (Signed)
Patient advised of lab results and verbalized understanding.  

## 2017-08-27 NOTE — Telephone Encounter (Signed)
Patient called about lab results from earlier this week. If patient does not answer, she is requesting a detailed message be left on her cell phone please.

## 2017-09-11 NOTE — Progress Notes (Signed)
Office Visit Note  Patient: Shelley Morrison             Date of Birth: Nov 05, 1966           MRN: [id number]             PCP: Javier Docker, MD Referring: Javier Docker, MD Visit Date: 09/22/2017 Occupation: '@GUAROCC'$ @    Subjective:  Medication Management and joint stiffness   History of Present Illness: Shelley Morrison is a 51 y.o. female with history of sero positive rheumatoid arthritis. She is doing better on methotrexate and Plaquenil combination. She is having minimal joint swelling in her hands.she had intentional weight loss of 20 pounds since last visit.  Activities of Daily Living:  Patient reports morning stiffness for 15-20 minutes.   Patient Denies nocturnal pain.  Difficulty dressing/grooming: Denies Difficulty climbing stairs: Denies Difficulty getting out of chair: Reports if sitting for a long time Difficulty using hands for taps, buttons, cutlery, and/or writing: Denies   Review of Systems  Constitutional: Positive for weight loss. Negative for fatigue, night sweats, weight gain and weakness.       Intentional  HENT: Negative.  Negative for mouth sores, trouble swallowing, trouble swallowing, mouth dryness and nose dryness.   Eyes: Negative.  Negative for pain, redness, visual disturbance and dryness.  Respiratory: Negative.  Negative for cough, shortness of breath and difficulty breathing.   Cardiovascular: Negative.  Negative for chest pain, palpitations, hypertension, irregular heartbeat and swelling in legs/feet.  Gastrointestinal: Negative.  Negative for blood in stool, constipation and diarrhea.  Endocrine: Negative.  Negative for increased urination.  Genitourinary: Negative.  Negative for vaginal dryness.  Musculoskeletal: Positive for morning stiffness. Negative for arthralgias, joint pain, joint swelling, myalgias, muscle weakness, muscle tenderness and myalgias.  Skin: Positive for hair loss. Negative for color change, rash, skin tightness,  ulcers and sensitivity to sunlight.       Hair thinning   Allergic/Immunologic: Negative.  Negative for susceptible to infections.  Neurological: Negative.  Negative for dizziness, memory loss and night sweats.  Hematological: Negative.  Negative for swollen glands.  Psychiatric/Behavioral: Positive for depressed mood. Negative for suicidal ideas and sleep disturbance. The patient is not nervous/anxious.     PMFS History:  Patient Active Problem List   Diagnosis Date Noted  . Rheumatoid arthritis with rheumatoid factor of multiple sites without organ or systems involvement ([facility]) 06/19/2017  . High risk medication use 06/18/2017  . Vitamin D deficiency 06/18/2017  . Primary insomnia 05/19/2017  . Essential hypertension 05/19/2017  . ANA positive 05/08/2017  . Rheumatoid factor positive 05/08/2017    Past Medical History:  Diagnosis Date  . Hypertension   . Lupus     No family history on file. Past Surgical History:  Procedure Laterality Date  . CHOLECYSTECTOMY     Social History   Social History Narrative  . No narrative on file     Objective: Vital Signs: BP 130/80   Pulse 78   Resp 16   Ht 5' 10.5" (1.791 m)   Wt 183 lb (83 kg)   LMP 09/14/2017   BMI 25.89 kg/m    Physical Exam  Constitutional: She is oriented to person, place, and time. She appears well-developed and well-nourished.  HENT:  Head: Normocephalic and atraumatic.  Eyes: Conjunctivae and EOM are normal.  Neck: Normal range of motion.  Cardiovascular: Normal rate, regular rhythm, normal heart sounds and intact distal pulses.   Pulmonary/Chest: Effort normal and  breath sounds normal.  Abdominal: Soft. Bowel sounds are normal.  Lymphadenopathy:    She has no cervical adenopathy.  Neurological: She is alert and oriented to person, place, and time.  Skin: Skin is warm and dry. Capillary refill takes less than 2 seconds.  Psychiatric: She has a normal mood and affect. Her behavior is normal.    Nursing note and vitals reviewed.    Musculoskeletal Exam: C-spine and thoracic lumbar spine good range of motion. Shoulder joints elbow joints wrist joint were good range of motion. She has limited range of motion in her wrist joint. She has synovial thickening over her MCP joints with no synovitis. She has some PIP thickening with no synovitis. Hip joints knee joints ankles MTPs PIPs with good range of motion with no synovitis.  CDAI Exam: CDAI Homunculus Exam:   Joint Counts:  CDAI Tender Joint count: 0 CDAI Swollen Joint count: 0  Global Assessments:  Patient Global Assessment: 4 Provider Global Assessment: 2  CDAI Calculated Score: 6    Investigation: No additional findings.TB Gold: negative in 04/2017, PLQ eye exam 05/2017 CBC Latest Ref Rng & Units 08/24/2017 08/03/2017 07/22/2017  WBC 3.8 - 10.8 K/uL 4.5 5.6 6.1  Hemoglobin 11.7 - 15.5 g/dL 10.9(L) 10.4(L) 10.6(L)  Hematocrit 35.0 - 45.0 % 34.7(L) 33.4(L) 33.0(L)  Platelets 140 - 400 K/uL 321 312 336   CMP Latest Ref Rng & Units 08/24/2017 08/03/2017 07/22/2017  Glucose 65 - 99 mg/dL 85 91 79  BUN 7 - 25 mg/dL '7 9 7  '$ Creatinine 0.50 - 1.05 mg/dL 0.82 0.92 0.82  Sodium 135 - 146 mmol/L 137 136 137  Potassium 3.5 - 5.3 mmol/L 4.4 4.1 4.1  Chloride 98 - 110 mmol/L 106 106 106  CO2 20 - 32 mmol/L '23 22 23  '$ Calcium 8.6 - 10.4 mg/dL 9.1 9.0 9.1  Total Protein 6.1 - 8.1 g/dL 7.0 7.0 7.3  Total Bilirubin 0.2 - 1.2 mg/dL 0.3 0.2 0.2  Alkaline Phos 33 - 130 U/L 45 43 51  AST 10 - 35 U/L '16 16 18  '$ ALT 6 - 29 U/L '16 14 18    '$ Imaging: No results found.  Speciality Comments: No specialty comments available.    Procedures:  No procedures performed Allergies: Patient has no known allergies.   Assessment / Plan:     Visit Diagnoses: Seropositive rheumatoid arthritis of multiple sites ([facility]) - RF 406,anti-CCP>250, ANA 1:320, ENA-, elevated ESR, history of synovitis. She is clinically doing much better without any synovitis on  examination today. She still have some intermittent arthralgias.  High risk medication use - MTX8 tablets by mouth every week, folic acid2 tablets by mouth daily, PLQ200 mg by mouth twice a day - her labs have been stable. We will check labs every 3 months to monitor for drug toxicity. Her eye exam has been up to date.Plan: CBC with Differential/Platelet, COMPLETE METABOLIC PANEL WITH GFR  Vitamin D deficiency - 5/18 Vit D 11: She is on vitamin D supplement - Plan: VITAMIN D 25 Hydroxy (Vit-D Deficiency, Fractures)today she may need vitamin D supplement again as her vitamin D was extremely low.I'll contact her after the lab results.  Primary insomnia - Better on trazodone  History of hypertension - Her blood pressure is better controlled now.   Orders: Orders Placed This Encounter  Procedures  . VITAMIN D 25 Hydroxy (Vit-D Deficiency, Fractures)  . CBC with Differential/Platelet  . COMPLETE METABOLIC PANEL WITH GFR   No orders of the defined  types were placed in this encounter.   Face-to-face time spent with patient was 30 minutes.greater than 50% of time was spent in counseling and coordination of care.  Follow-Up Instructions: Return in about 5 months (around 02/22/2018) for Rheumatoid arthritis.   Bo Merino, MD  Note - This record has been created using Editor, commissioning.  Chart creation errors have been sought, but may not always  have been located. Such creation errors do not reflect on  the standard of medical care.

## 2017-09-22 ENCOUNTER — Encounter: Payer: Self-pay | Admitting: Rheumatology

## 2017-09-22 ENCOUNTER — Ambulatory Visit (INDEPENDENT_AMBULATORY_CARE_PROVIDER_SITE_OTHER): Payer: BLUE CROSS/BLUE SHIELD | Admitting: Rheumatology

## 2017-09-22 VITALS — BP 130/80 | HR 78 | Resp 16 | Ht 70.5 in | Wt 183.0 lb

## 2017-09-22 DIAGNOSIS — E559 Vitamin D deficiency, unspecified: Secondary | ICD-10-CM | POA: Diagnosis not present

## 2017-09-22 DIAGNOSIS — M0579 Rheumatoid arthritis with rheumatoid factor of multiple sites without organ or systems involvement: Secondary | ICD-10-CM | POA: Diagnosis not present

## 2017-09-22 DIAGNOSIS — Z8679 Personal history of other diseases of the circulatory system: Secondary | ICD-10-CM

## 2017-09-22 DIAGNOSIS — F5101 Primary insomnia: Secondary | ICD-10-CM | POA: Diagnosis not present

## 2017-09-22 DIAGNOSIS — Z79899 Other long term (current) drug therapy: Secondary | ICD-10-CM | POA: Diagnosis not present

## 2017-09-22 NOTE — Patient Instructions (Addendum)
Standing Labs We placed an order today for your standing lab work.    Please come back and get your standing labs in November and every 3 months   Plaquenil Eye exam is due and June 2019  We have open lab Monday through Friday from 8:30-11:30 AM and 1:30-4 PM at the office of Dr. Pollyann Savoy.   The office is located at [address], [place], [place] [address] No appointment is necessary.   Labs are drawn by [facility].  You may receive a bill from [facility] for your lab work. If you have any questions regarding directions or hours of operation,  please call [phone].

## 2017-09-23 ENCOUNTER — Telehealth: Payer: Self-pay | Admitting: *Deleted

## 2017-09-23 LAB — VITAMIN D 25 HYDROXY (VIT D DEFICIENCY, FRACTURES): Vit D, 25-Hydroxy: 39 ng/mL (ref 30–100)

## 2017-09-23 MED ORDER — VITAMIN D (ERGOCALCIFEROL) 1.25 MG (50000 UNIT) PO CAPS: 50000.0000 [IU] | ORAL_CAPSULE | ORAL | 0 refills | Status: DC

## 2017-09-23 NOTE — Progress Notes (Signed)
Vitamin D is better. Patient should take vitamin D 50,000 units once a month. And repeat in 6 months.

## 2017-09-23 NOTE — Telephone Encounter (Signed)
-----   Message from Pollyann Savoy, MD sent at 09/23/2017  8:53 AM EDT ----- Vitamin D is better. Patient should take vitamin D 50,000 units once a month. And repeat in 6 months.

## 2017-10-05 ENCOUNTER — Other Ambulatory Visit: Payer: Self-pay | Admitting: Rheumatology

## 2017-10-05 NOTE — Telephone Encounter (Signed)
Last Visit: 09/22/17 Next Visit: 02/24/18 Labs:: 08/24/17 Mild anemia PLQ Eye Exam:  06/17/17 WNL   Okay to refill per Dr. Corliss Skains

## 2018-01-11 ENCOUNTER — Other Ambulatory Visit: Payer: Self-pay | Admitting: Rheumatology

## 2018-01-11 ENCOUNTER — Other Ambulatory Visit: Payer: Self-pay

## 2018-01-11 DIAGNOSIS — Z79899 Other long term (current) drug therapy: Secondary | ICD-10-CM

## 2018-01-11 LAB — CBC WITH DIFFERENTIAL/PLATELET
BASOS ABS: 68 {cells}/uL (ref 0–200)
Basophils Relative: 0.9 %
EOS PCT: 2.3 %
Eosinophils Absolute: 173 cells/uL (ref 15–500)
HCT: 37.2 % (ref 35.0–45.0)
HEMOGLOBIN: 12.2 g/dL (ref 11.7–15.5)
Lymphs Abs: 3135 cells/uL (ref 850–3900)
MCH: 27.5 pg (ref 27.0–33.0)
MCHC: 32.8 g/dL (ref 32.0–36.0)
MCV: 83.8 fL (ref 80.0–100.0)
MPV: 10.5 fL (ref 7.5–12.5)
Monocytes Relative: 9.4 %
NEUTROS ABS: 3420 {cells}/uL (ref 1500–7800)
Neutrophils Relative %: 45.6 %
PLATELETS: 301 10*3/uL (ref 140–400)
RBC: 4.44 10*6/uL (ref 3.80–5.10)
RDW: 14.6 % (ref 11.0–15.0)
Total Lymphocyte: 41.8 %
WBC: 7.5 10*3/uL (ref 3.8–10.8)
WBCMIX: 705 {cells}/uL (ref 200–950)

## 2018-01-11 LAB — COMPLETE METABOLIC PANEL WITH GFR
AG Ratio: 1.1 (calc) (ref 1.0–2.5)
ALBUMIN MSPROF: 4.1 g/dL (ref 3.6–5.1)
ALT: 24 U/L (ref 6–29)
AST: 21 U/L (ref 10–35)
Alkaline phosphatase (APISO): 65 U/L (ref 33–130)
BILIRUBIN TOTAL: 0.2 mg/dL (ref 0.2–1.2)
BUN: 8 mg/dL (ref 7–25)
CALCIUM: 9.6 mg/dL (ref 8.6–10.4)
CHLORIDE: 106 mmol/L (ref 98–110)
CO2: 29 mmol/L (ref 20–32)
CREATININE: 0.79 mg/dL (ref 0.50–1.05)
GFR, EST AFRICAN AMERICAN: 100 mL/min/{1.73_m2} (ref >=60)
GFR, Est Non African American: 87 mL/min/{1.73_m2} (ref >=60)
GLUCOSE: 87 mg/dL (ref 65–99)
Globulin: 3.6 g/dL (calc) (ref 1.9–3.7)
Potassium: 4.3 mmol/L (ref 3.5–5.3)
Sodium: 140 mmol/L (ref 135–146)
TOTAL PROTEIN: 7.7 g/dL (ref 6.1–8.1)

## 2018-01-11 NOTE — Telephone Encounter (Signed)
Last Visit: 09/22/17 Next visit: 02/24/18 Labs: 08/24/17 mild anemia  Patient advised she is due to update labs. Patient states she will update labs today.

## 2018-01-12 ENCOUNTER — Other Ambulatory Visit: Payer: Self-pay | Admitting: Rheumatology

## 2018-01-12 NOTE — Telephone Encounter (Signed)
Patient updated labs 01/11/18 Okay to refill per Dr. Corliss Skainseveshwar

## 2018-01-12 NOTE — Telephone Encounter (Signed)
Last Visit: 09/22/17 Next visit: 02/24/18 labs 01/11/18 WNL PLQ Eye Exam: 12/17/17 WNL  Okay to refill per Dr. Corliss Skainseveshwar

## 2018-01-12 NOTE — Progress Notes (Signed)
wnl

## 2018-02-10 NOTE — Progress Notes (Deleted)
Office Visit Note  Patient: Shelley Morrison             Date of Birth: 10/31/1966           MRN: [id number]             PCP: Gilda CreasePavelock, Richard M, MD Referring: Gilda CreasePavelock, Richard M, MD Visit Date: 02/24/2018 Occupation: @GUAROCC @    Subjective:  No chief complaint on file.   History of Present Illness: Shelley Morrison is a 52 y.o. female ***   Activities of Daily Living:  Patient reports morning stiffness for *** {minute/hour:19697}.   Patient {ACTIONS;DENIES/REPORTS:21021675::"Denies"} nocturnal pain.  Difficulty dressing/grooming: {ACTIONS;DENIES/REPORTS:21021675::"Denies"} Difficulty climbing stairs: {ACTIONS;DENIES/REPORTS:21021675::"Denies"} Difficulty getting out of chair: {ACTIONS;DENIES/REPORTS:21021675::"Denies"} Difficulty using hands for taps, buttons, cutlery, and/or writing: {ACTIONS;DENIES/REPORTS:21021675::"Denies"}   No Rheumatology ROS completed.   PMFS History:  Patient Active Problem List   Diagnosis Date Noted  . 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 High risk medication use 06/18/2017  . Vitamin D deficiency 06/18/2017  . Primary insomnia 05/19/2017  . Essential hypertension 05/19/2017  . ANA positive 05/08/2017  . Rheumatoid factor positive 05/08/2017    Past Medical History:  Diagnosis Date  . Hypertension   . Lupus     No family history on file. Past Surgical History:  Procedure Laterality Date  . CHOLECYSTECTOMY     Social History   Social History Narrative  . Not on file     Objective: Vital Signs: There were no vitals taken for this visit.   Physical Exam   Musculoskeletal Exam: ***  CDAI Exam: No CDAI exam completed.    Investigation: No additional findings.PLQ eye exam: 12/17/2017 CBC Latest Ref Rng & Units 01/11/2018 08/24/2017 08/03/2017  WBC 3.8 - 10.8 Thousand/uL 7.5 4.5 5.6  Hemoglobin 11.7 - 15.5 g/dL 72.512.2 10.9(L) 10.4(L)  Hematocrit 35.0 -  45.0 % 37.2 34.7(L) 33.4(L)  Platelets 140 - 400 Thousand/uL 301 321 312   CMP Latest Ref Rng & Units 01/11/2018 08/24/2017 08/03/2017  Glucose 65 - 99 mg/dL 87 85 91  BUN 7 - 25 mg/dL 8 7 9   Creatinine 0.50 - 1.05 mg/dL 3.660.79 4.400.82 3.470.92  Sodium 135 - 146 mmol/L 140 137 136  Potassium 3.5 - 5.3 mmol/L 4.3 4.4 4.1  Chloride 98 - 110 mmol/L 106 106 106  CO2 20 - 32 mmol/L 29 23 22   Calcium 8.6 - 10.4 mg/dL 9.6 9.1 9.0  Total Protein 6.1 - 8.1 g/dL 7.7 7.0 7.0  Total Bilirubin 0.2 - 1.2 mg/dL 0.2 0.3 0.2  Alkaline Phos 33 - 130 U/L - 45 43  AST 10 - 35 U/L 21 16 16   ALT 6 - 29 U/L 24 16 14     Imaging: No results found.  Speciality Comments: PLQ Eye Exam: 12/17/17 WNL @ [facility]    Procedures:  No procedures performed Allergies: Patient has no known allergies.   Assessment / Plan:     Visit Diagnoses: No diagnosis found.    Orders: No orders of the defined types were placed in this encounter.  No orders of the defined types were placed in this encounter.   Face-to-face time spent with patient was *** minutes. 50% of time was spent in counseling and coordination of care.  Follow-Up Instructions: No Follow-up on file.   Ellen HenriMarissa C Lydia Toren, CMA  Note - This record has been created using Animal nutritionistDragon software.  Chart creation errors have been sought, but  may not always  have been located. Such creation errors do not reflect on  the standard of medical care.

## 2018-02-24 ENCOUNTER — Ambulatory Visit: Payer: BLUE CROSS/BLUE SHIELD | Admitting: Rheumatology

## 2018-03-09 ENCOUNTER — Telehealth: Payer: Self-pay | Admitting: Rheumatology

## 2018-03-09 NOTE — Telephone Encounter (Signed)
Patient left a vm message wanting to know that if her blood work consists of glucose and cholesterol levels.  She is having to do blood work for work and wanted to know if these are included so she wouldn't have to do it again.  If so, she is wanting to get a copy of the lab work

## 2018-03-09 NOTE — Telephone Encounter (Signed)
Left message to advise patient that the blood work we have has a glucose level but no cholesterol level.

## 2018-03-13 ENCOUNTER — Other Ambulatory Visit: Payer: Self-pay

## 2018-03-13 ENCOUNTER — Emergency Department (HOSPITAL_COMMUNITY)
Admission: EM | Admit: 2018-03-13 | Discharge: 2018-03-13 | Disposition: A | Discharge status: Home / Self Care | Payer: BLUE CROSS/BLUE SHIELD | Attending: Emergency Medicine | Admitting: Emergency Medicine

## 2018-03-13 ENCOUNTER — Encounter (HOSPITAL_COMMUNITY): Payer: Self-pay

## 2018-03-13 DIAGNOSIS — J040 Acute laryngitis: Secondary | ICD-10-CM

## 2018-03-13 DIAGNOSIS — Z79899 Other long term (current) drug therapy: Secondary | ICD-10-CM | POA: Insufficient documentation

## 2018-03-13 DIAGNOSIS — I10 Essential (primary) hypertension: Secondary | ICD-10-CM | POA: Diagnosis not present

## 2018-03-13 DIAGNOSIS — Z87891 Personal history of nicotine dependence: Secondary | ICD-10-CM | POA: Diagnosis not present

## 2018-03-13 DIAGNOSIS — J050 Acute obstructive laryngitis [croup]: Secondary | ICD-10-CM | POA: Insufficient documentation

## 2018-03-13 DIAGNOSIS — J029 Acute pharyngitis, unspecified: Secondary | ICD-10-CM | POA: Diagnosis present

## 2018-03-13 DIAGNOSIS — J069 Acute upper respiratory infection, unspecified: Secondary | ICD-10-CM | POA: Diagnosis not present

## 2018-03-13 DIAGNOSIS — B9789 Other viral agents as the cause of diseases classified elsewhere: Secondary | ICD-10-CM

## 2018-03-13 LAB — RAPID STREP SCREEN (MED CTR MEBANE ONLY): STREPTOCOCCUS, GROUP A SCREEN (DIRECT): NEGATIVE

## 2018-03-13 MED ORDER — BENZONATATE 100 MG PO CAPS: 100.0000 mg | ORAL_CAPSULE | Freq: Three times a day (TID) | ORAL | 0 refills | Status: DC

## 2018-03-13 MED ORDER — CETIRIZINE HCL 10 MG PO TABS: 10.0000 mg | ORAL_TABLET | Freq: Every day | ORAL | 0 refills | Status: DC

## 2018-03-13 MED ORDER — GUAIFENESIN ER 1200 MG PO TB12: 1.0000 | ORAL_TABLET | Freq: Two times a day (BID) | ORAL | 0 refills | Status: DC | PRN

## 2018-03-13 MED ORDER — FLUTICASONE PROPIONATE 50 MCG/ACT NA SUSP: 2.0000 | Freq: Every day | NASAL | 0 refills | Status: DC

## 2018-03-13 NOTE — ED Triage Notes (Signed)
Pt presents with complaints of cough and sore throat x 2 weeks. Pt states she is now starting to lose her voice from her cough

## 2018-03-13 NOTE — Discharge Instructions (Signed)
Please read and follow all provided instructions.  Your diagnoses today include:  1. Viral URI with cough   2. Laryngitis     Tests performed today include: Vital signs You were offered a chest xray declined at this time.   Medications prescribed/advised:  1. Musinex [Guaifenesin] as a decongestant [thin mucus - you have to be well hydrated when taking this for it to work],  2. Tylenol for fever/pain and Motrin/Ibuprofen for muscle aches 3. Flonase Steroid Nasal Spray. This does not work to maximum capability unless used daily >1-2 weeks.  4. Allegra or Zyrtec: This medication is an allergy medication that may aid in helping to relieve your symptoms. Please take daily and discuss with your PCP if you should remain on this medication at follow up. If you were prescribed a allergy medication (allegra) please take daily.  5. Cough Suppressant: Take tessalon as prescirbed.   You received a strep test in the department that was negative.  Home care instructions:  An upper respiratory infection (URI) is also sometimes known as the common cold. Most people improve within 1 week, but symptoms can last up to 2 weeks. A residual cough may last even longer.   URI is most commonly caused by a virus. Viruses are NOT treated with antibiotics. You can easily spread the virus to others by oral contact. This includes kissing, sharing a glass, coughing, or sneezing. Touching your mouth or nose and then touching a surface, which is then touched by another person, can also spread the virus.   TREATMENT  Treatment is directed at relieving symptoms. There is no cure. Antibiotics are not effective, because the infection is caused by a virus, not by bacteria. Treatment may include:  Increased fluid intake. Sports drinks offer valuable electrolytes, sugars, and fluids.  Breathing heated mist or steam (vaporizer or shower).  Eating chicken soup or other clear broths, and maintaining good nutrition.  Getting  plenty of rest.  Using gargles or lozenges for comfort.  Controlling fevers with ibuprofen or acetaminophen as directed by your caregiver.  Increasing usage of your inhaler if you have asthma.  Return to work when your temperature has returned to normal.   Follow-up instructions: Followup with your primary care doctor in 4 days if your symptoms persist.  Your more than welcome to return to the emergency department if symptoms worsen or become concerning.  Return instructions:  Please return to the Emergency Department if you do not get better, if you get worse, or new symptoms OR  - Fever (temperature greater than 101.45F)  - Bleeding that does not stop with holding pressure to the area    -Severe pain (please note that you may be more sore the day after your accident)  - Chest Pain  - Difficulty breathing (worsening shortness of breath with sputum production may  be a sign of pneumonia.   - Severe nausea or vomiting  - Inability to tolerate food and liquids  - Passing out  - Skin becoming red around your wounds  - Change in mental status (confusion or lethargy)  - New numbness or weakness     -You develop fever, swollen neck glands, pain with swallowing or Butzer areas on  the back of your throat. This may be a sign of strep throat.  Please return if you have any other emergent concerns

## 2018-03-13 NOTE — ED Provider Notes (Signed)
[facility] EMERGENCY DEPARTMENT Provider Note   CSN: [id number] Arrival date & time: 03/13/18  1808     History   Chief Complaint Chief Complaint  Patient presents with  . Sore Throat  . Cough    HPI Shelley Morrison is a 52 y.o. female with a history of HTN and Lupus who presents to the emergency department today for sore throat and cough.  Patient is a 2 weeks ago she had a nasal congestion, rhinorrhea, itchy/watery eyes, sneezing, dry cough that lasted for approximately 5 days before greatly improving.  She reports on day 7 she was symptom-free.  She notes that 3 days ago she started developing similar symptoms including nasal congestion, rhinorrhea, sneezing, dry cough.  She now reports that she has a sore throat with associated dysphasia.  She has been taking NyQuil for symptoms without relief.  She is unsure of sick contacts.  She reports that today she started developing loss of her voice.  She denies any associated headache, fever, sinus pressure/pain, inability to control secretions, trauma, neck stiffness, rash, chest pain, shortness of breath, orthopnea, dyspnea on exertion, sputum production, hemoptysis, lower leg swelling.  Patient is not on ACE inhibitor.  HPI  Past Medical History:  Diagnosis Date  . Hypertension   . Lupus     Patient Active Problem List   Diagnosis Date Noted  . 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 High risk medication use 06/18/2017  . Vitamin D deficiency 06/18/2017  . Primary insomnia 05/19/2017  . Essential hypertension 05/19/2017  . ANA positive 05/08/2017  . Rheumatoid factor positive 05/08/2017    Past Surgical History:  Procedure Laterality Date  . CHOLECYSTECTOMY      OB History    No data available       Home Medications    Prior to Admission medications   Medication Sig Start Date End Date Taking? Authorizing Provider    atenolol (TENORMIN) 25 MG tablet Take 50 mg by mouth daily.  06/05/17   [provider]  busPIRone (BUSPAR) 15 MG tablet Take 15 mg by mouth 3 (three) times daily.    [provider]  cyclobenzaprine (FLEXERIL) 5 MG tablet Take 1 tablet (5 mg total) by mouth 3 (three) times daily as needed for muscle spasms. 05/28/17   Devoria Albe, MD  folic acid (FOLVITE) 1 MG tablet Take 2 tablets (2 mg total) by mouth daily. 06/22/17   Pollyann Savoy, MD  hydroxychloroquine (PLAQUENIL) 200 MG tablet TAKE 1 TABLET BY MOUTH 2 TIMES DAILY 01/12/18   Pollyann Savoy, MD  methotrexate (RHEUMATREX) 2.5 MG tablet Take 8 tablets by mouth once a week 01/12/18   Pollyann Savoy, MD  PREMPRO 0.625-5 MG tablet Take 1 tablet by mouth daily. 05/12/17   [provider]  traZODone (DESYREL) 100 MG tablet Take 100 mg by mouth at bedtime. 05/12/17   [provider]  Vitamin D, Ergocalciferol, (DRISDOL) 50000 units CAPS capsule Take 1 capsule by mouth every 30 days 01/12/18   Pollyann Savoy, MD    Family History No family history on file.  Social History Social History   Tobacco Use  . Smoking status: Former Smoker    Start date: 12/30/2011  . Smokeless tobacco: Never Used  Substance Use Topics  . Alcohol use: No  . Drug use: No     Allergies   Patient has no known allergies.   Review of Systems  Review of Systems  All other systems reviewed and are negative.    Physical Exam Updated Vital Signs BP (!) 142/98 (BP Location: Right Arm)   Pulse 100   Temp 98.3 F (36.8 C) (Oral)   Resp 18   SpO2 97%   Physical Exam  Constitutional: She appears well-developed and well-nourished.  HENT:  Head: Normocephalic and atraumatic.  Right Ear: Tympanic membrane and external ear normal.  Left Ear: Tympanic membrane and external ear normal.  Nose: Mucosal edema present. Right sinus exhibits no maxillary sinus tenderness and no frontal sinus tenderness. Left sinus exhibits no  maxillary sinus tenderness and no frontal sinus tenderness.  Mouth/Throat: Uvula is midline, oropharynx is clear and moist and mucous membranes are normal. No tonsillar exudate.  Bilateral ear canals with significant amount of wax. TM visualized beyond this is pearly gray with cone of light.  The patient has obvious laryngitis but no muffled phonation and patient is in control of secretions. No stridor.  Midline uvula without edema. Soft palate rises symmetrically. Mild tonsillar erythema without erythema or exudates. No PTA. Tongue protrusion is normal. No trismus. No creptius on neck palpation and patient has good dentition. No gingival erythema or fluctuance noted. Mucus membranes moist.    Eyes: Pupils are equal, round, and reactive to light. Right eye exhibits no discharge. Left eye exhibits no discharge. No scleral icterus.  Neck: Trachea normal. Neck supple. No spinous process tenderness present. No neck rigidity. Normal range of motion present.  No nuchal rigidity or meningismus  Cardiovascular: Normal rate, regular rhythm and intact distal pulses.  No murmur heard. Pulses:      Radial pulses are 2+ on the right side, and 2+ on the left side.       Dorsalis pedis pulses are 2+ on the right side, and 2+ on the left side.       Posterior tibial pulses are 2+ on the right side, and 2+ on the left side.  No lower extremity swelling or edema. Calves symmetric in size bilaterally.  Pulmonary/Chest: Effort normal and breath sounds normal. No accessory muscle usage or stridor. No tachypnea. No respiratory distress. She has no decreased breath sounds. She has no wheezes. She has no rhonchi. She has no rales. She exhibits no tenderness.  No increased work of breathing. No accessory muscle use. Patient is sitting upright, speaking in full sentences without difficulty   Abdominal: Soft. Bowel sounds are normal. There is no tenderness. There is no rebound and no guarding.  Musculoskeletal: She exhibits  no edema.  Lymphadenopathy:    She has no cervical adenopathy.  Neurological: She is alert.  Skin: Skin is warm and dry. No rash noted. She is not diaphoretic.  Psychiatric: She has a normal mood and affect.  Nursing note and vitals reviewed.    ED Treatments / Results  Labs (all labs ordered are listed, but only abnormal results are displayed) Labs Reviewed  RAPID STREP SCREEN (NOT AT [facility])  CULTURE, GROUP A STREP [facility])    EKG  EKG Interpretation None       Radiology No results found.  Procedures Procedures (including critical care time)  Medications Ordered in ED Medications - No data to display   Initial Impression / Assessment and Plan / ED Course  I have reviewed the triage vital signs and the nursing notes.  Pertinent labs & imaging results that were available during my care of the patient were reviewed by me and considered in my medical decision  making (see chart for details).     52 y.o. female with 3 day history of nasal congestion, rhinorrhea, sneezing, dry cough and sore throat with associated dysphasia.  Patient's exam consistent with a viral upper respiratory infection versus viral pharyngitis.   No evidence of sinusitis and lungs are clear to auscultation bilaterally.  Strep test performed in triage is negative.  She does have obvious laryngitis.  No concern for peritonsillar abscess or RPA.  Given reocurrence of symptoms patient was offered chest x-ray but declined at this time.  Patient will be discharged home with supportive care.  Pt does not appear dehydrated, but did discuss importance of water rehydration. I advised the patient to follow-up with PCP this week. Specific return precautions discussed. Time was given for all questions to be answered. The patient verbalized understanding and agreement with plan. The patient appears safe for discharge home.  Final Clinical Impressions(s) / ED Diagnoses   Final diagnoses:  Viral URI with cough    Laryngitis    ED Discharge Orders        Ordered    Guaifenesin [facility](MUCINEX MAXIMUM STRENGTH) 1200 MG TB12  Every 12 hours PRN     03/13/18 1928    fluticasone (FLONASE) 50 MCG/ACT nasal spray  Daily     03/13/18 1928    cetirizine (ZYRTEC) 10 MG tablet  Daily     03/13/18 1928    benzonatate (TESSALON) 100 MG capsule  Every 8 hours     03/13/18 1928       Princella PellegriniMaczis, [place] Scheiderer M, PA-C 03/13/18 1929    Tegeler, Canary Brimhristopher J, MD 03/13/18 [id number]

## 2018-03-16 LAB — CULTURE, GROUP A STREP (THRC)

## 2018-03-19 IMAGING — DX DG CHEST 2V
2 series · 2 of 2 positions shown · non-contrast
Comparison: None.

CLINICAL DATA: Central chest pain. Shortness of breath with nausea.
Former smoker.

EXAM:
CHEST  2 VIEW

[w chest pa]
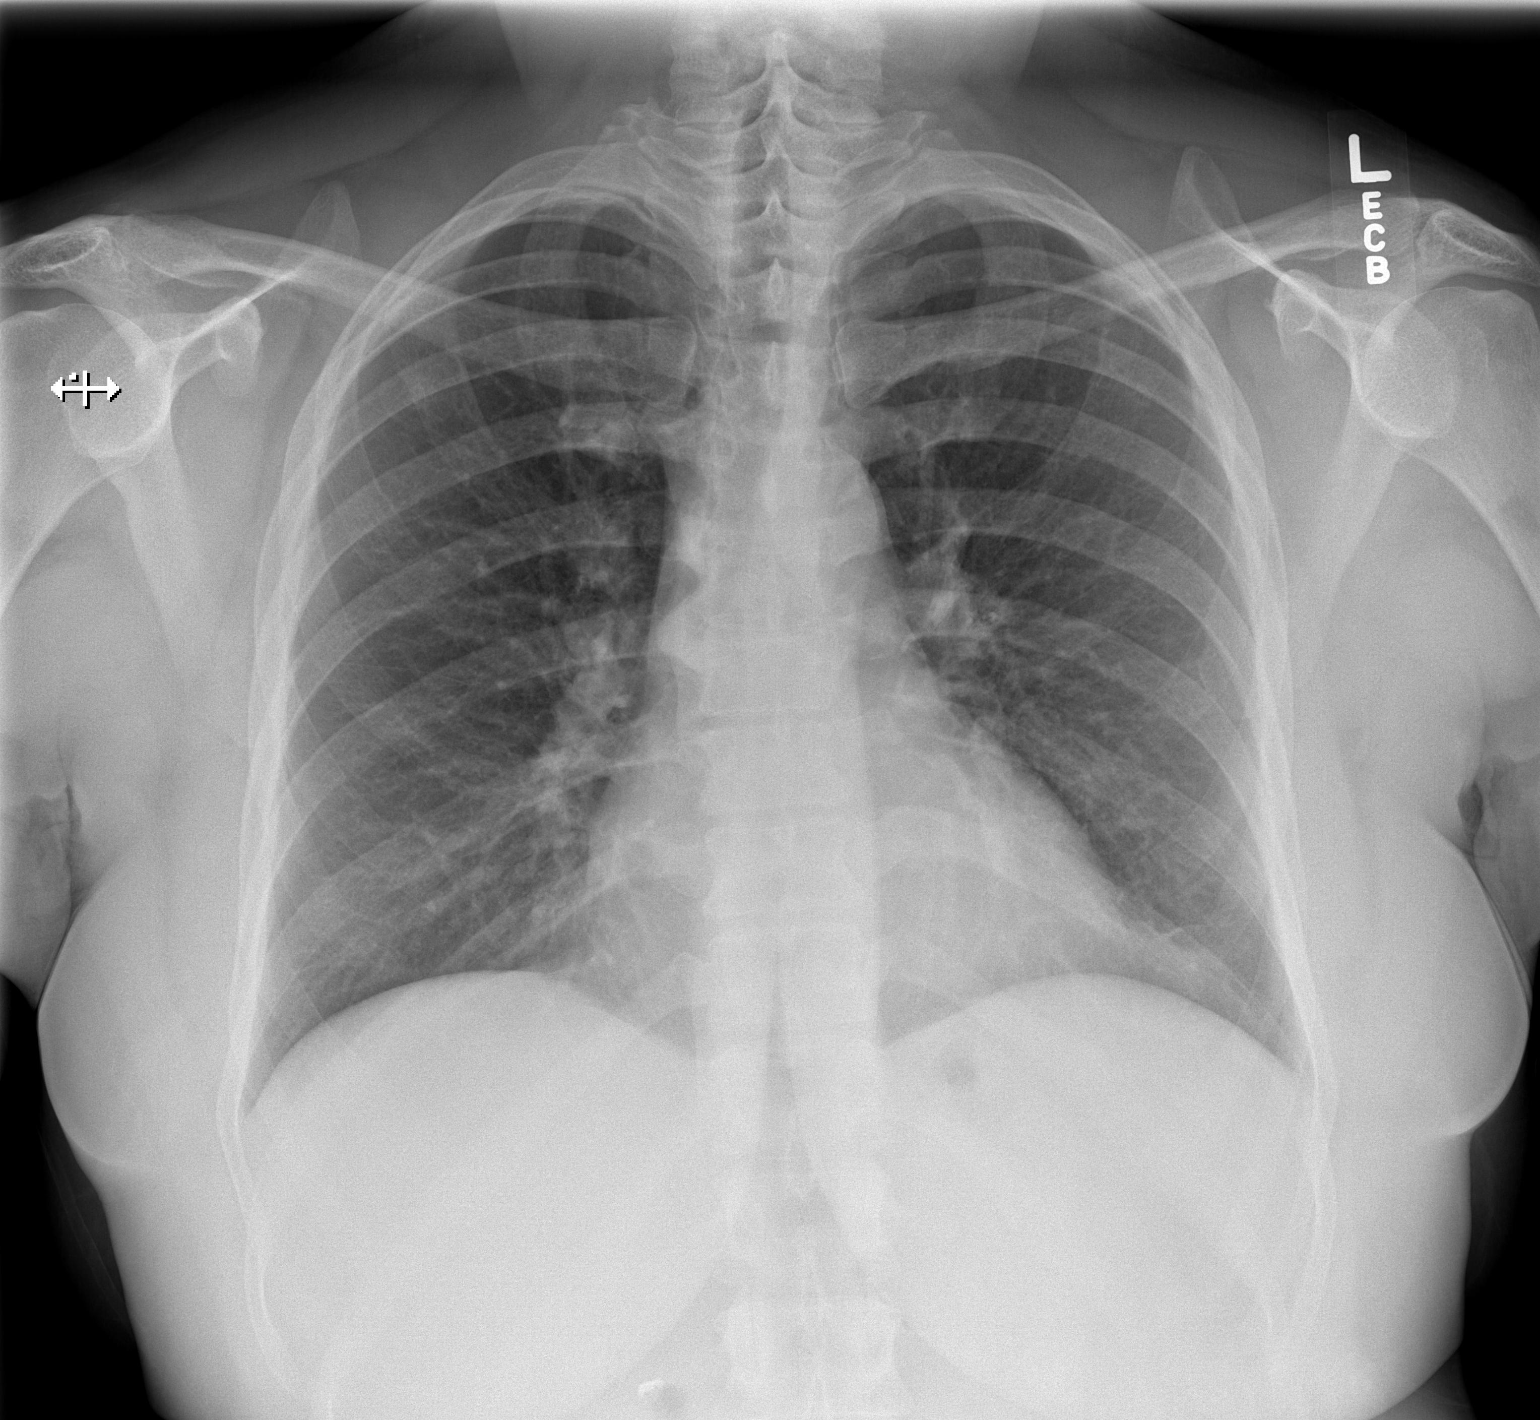

[w chest lat]
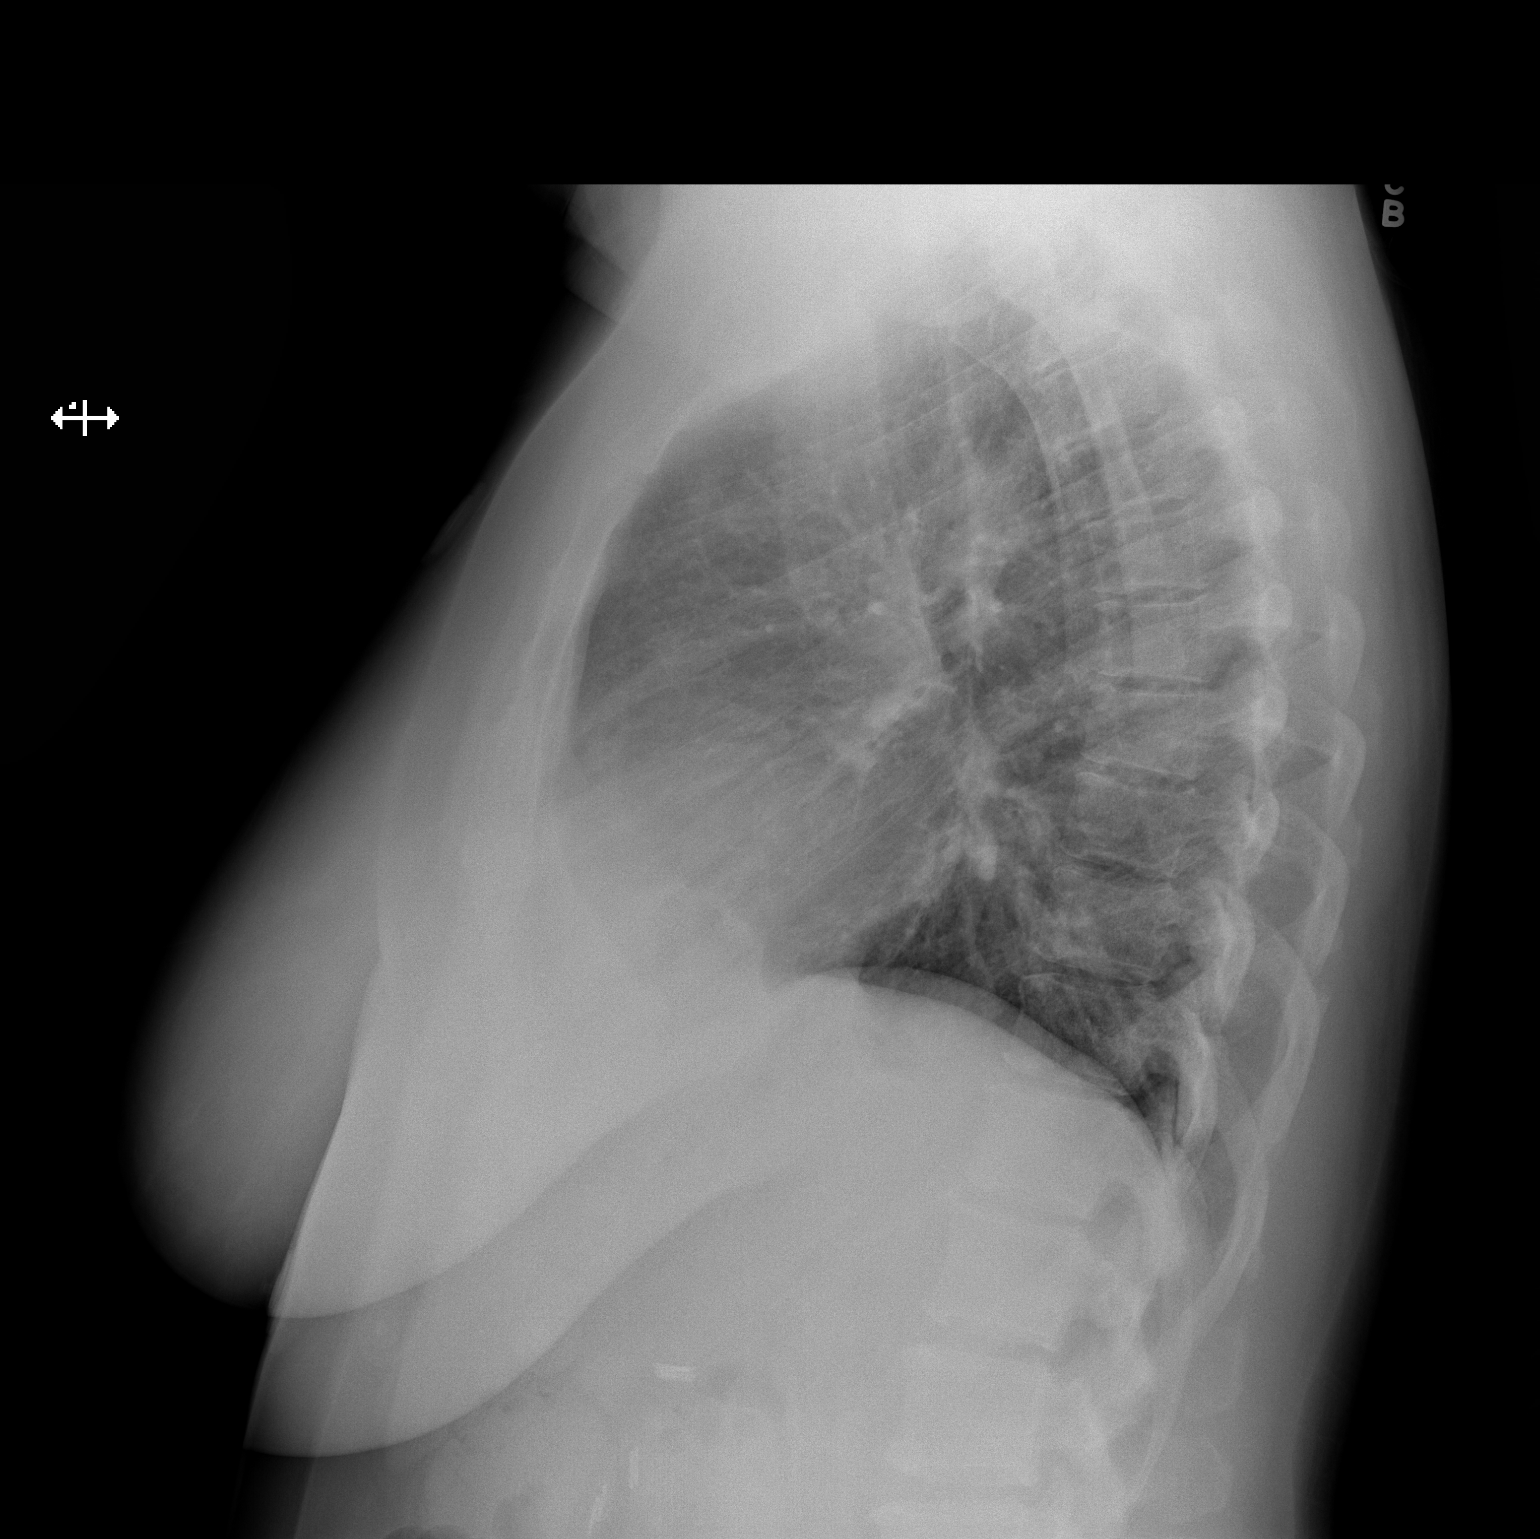

[2 of 2 positions shown; findings below may reference images not displayed]

FINDINGS: Heart size upper limits normal. No infiltrates or failure. A rounded
density is seen overlying the RIGHT upper lobe which appears to
represent an EKG lead. No effusion or pneumothorax. Bones are
unremarkable.
IMPRESSION: No active cardiopulmonary disease.

## 2018-03-22 NOTE — Progress Notes (Signed)
Office Visit Note  Patient: Shelley Morrison             Date of Birth: [birth year]           MRN: [id number]             PCP: Javier Docker, MD Referring: Javier Docker, MD Visit Date: 03/23/2018 Occupation: _0 @    Subjective:  Occasional hand pain   History of Present Illness: Shelley Morrison is a 52 y.o. female with history of seropositive rheumatoid arthritis.  Patient states that she continues to take methotrexate 8 tablets every week, folic acid 2 mg daily and Plaquenil 200 mg twice daily.  She states that about 3 weeks ago she had an upper respiratory tract infection and stopped her methotrexate.  She states she is due for her next dose tomorrow.  She reports that she has been reading up on methotrexate and is concerned about the side effects and would like to discuss coming off of methotrexate.  She denies any recent flares of her rheumatoid arthritis.  She states she has occasional discomfort in her bilateral hands and right ankle.  She denies any joint swelling at this time.  She states that she has more joint stiffness than joint pain.  She denies any rashes, swollen joints, sores in her mouth or nose, increased fatigue or fevers.  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 States she continues to take vitamin D 50,000 units once a week.     Activities of Daily Living:  Patient reports morning stiffness for 10 minutes.   Patient Denies nocturnal pain.  Difficulty dressing/grooming: Denies Difficulty climbing stairs: Denies Difficulty getting out of chair: Denies Difficulty using hands for taps, buttons, cutlery, and/or writing: Denies   Review of Systems  Constitutional: Positive for fatigue.  HENT: Negative for mouth sores, trouble swallowing, trouble swallowing, mouth dryness and nose dryness.   Eyes: Positive for dryness. Negative for pain and visual disturbance.  Respiratory:  Negative for cough, hemoptysis, shortness of breath and difficulty breathing.   Cardiovascular: Negative for chest pain, palpitations, hypertension and swelling in legs/feet.  Gastrointestinal: Negative for blood in stool, constipation and diarrhea.  Endocrine: Negative for increased urination.  Genitourinary: Negative for painful urination.  Musculoskeletal: Positive for arthralgias, joint pain and morning stiffness. Negative for joint swelling, myalgias, muscle weakness, muscle tenderness and myalgias.  Skin: Negative for color change, pallor, rash, hair loss, nodules/bumps, skin tightness, ulcers and sensitivity to sunlight.  Allergic/Immunologic: Negative for susceptible to infections.  Neurological: Negative for dizziness, numbness, headaches and weakness.  Hematological: Negative for swollen glands.  Psychiatric/Behavioral: Positive for sleep disturbance. Negative for depressed mood. The patient is nervous/anxious.     PMFS History:  Patient Active Problem List   Diagnosis Date Noted  . Rheumatoid arthritis with rheumatoid factor of multiple sites without organ or systems involvement ([place]) 06/19/2017  . High risk medication use 06/18/2017  . Vitamin D deficiency 06/18/2017  . Primary insomnia 05/19/2017  . Essential hypertension 05/19/2017  . ANA positive 05/08/2017  . Rheumatoid factor positive 05/08/2017    Past Medical History:  Diagnosis Date  . Hypertension   . Lupus     Family History  Problem Relation Age of Onset  . Healthy Son   . Healthy Daughter    Past Surgical History:  Procedure Laterality Date  . CHOLECYSTECTOMY     Social History  Social History Narrative  . Not on file     Objective: Vital Signs: BP 135/86 (BP Location: Left Arm, Patient Position: Sitting, Cuff Size: Normal)   Pulse 64   Resp 16   Ht 5' 10.5" (1.791 m)   Wt 206 lb (93.4 kg)   BMI 29.14 kg/m    Physical Exam  Constitutional: She is oriented to person, place, and time. She  appears well-developed and well-nourished.  HENT:  Head: Normocephalic and atraumatic.  Eyes: Conjunctivae and EOM are normal.  Neck: Normal range of motion.  Cardiovascular: Normal rate, regular rhythm, normal heart sounds and intact distal pulses.  Pulmonary/Chest: Effort normal and breath sounds normal.  Abdominal: Soft. Bowel sounds are normal.  Lymphadenopathy:    She has no cervical adenopathy.  Neurological: She is alert and oriented to person, place, and time.  Skin: Skin is warm and dry. Capillary refill takes less than 2 seconds.  Psychiatric: She has a normal mood and affect. Her behavior is normal.  Nursing note and vitals reviewed.    Musculoskeletal Exam: C-spine, thoracic spine, lumbar spine good range of motion.  No midline spinal tenderness.  No SI joint tenderness.  Shoulder joints, elbow joints, wrist joints, MCPs, PIPs, DIPs good range of motion with no synovitis.  She has synovial thickening of the right second and third MCPs.  Hip joints, knee joints, ankle joints, MTPs, PIPs and DIPs good range of motion with no synovitis.  No warmth or effusion of bilateral knees.  She has bilateral knee crepitus.  She has mild tenderness of left trochanteric bursa.  CDAI Exam: CDAI Homunculus Exam:   Joint Counts:  CDAI Tender Joint count: 0 CDAI Swollen Joint count: 0  Global Assessments:  Patient Global Assessment: 4 Provider Global Assessment: 4  CDAI Calculated Score: 8    Investigation: No additional findings.PLQ eye exam: 12/17/2017 CBC Latest Ref Rng & Units 01/11/2018 08/24/2017 08/03/2017  WBC 3.8 - 10.8 Thousand/uL 7.5 4.5 5.6  Hemoglobin 11.7 - 15.5 g/dL 12.2 10.9(L) 10.4(L)  Hematocrit 35.0 - 45.0 % 37.2 34.7(L) 33.4(L)  Platelets 140 - 400 Thousand/uL 301 321 312   CMP Latest Ref Rng & Units 01/11/2018 08/24/2017 08/03/2017  Glucose 65 - 99 mg/dL 87 85 91  BUN 7 - 25 mg/dL _0 Creatinine 0.50 - 1.05 mg/dL 0.79 0.82 0.92  Sodium 135 - 146 mmol/L 140 137 136   Potassium 3.5 - 5.3 mmol/L 4.3 4.4 4.1  Chloride 98 - 110 mmol/L 106 106 106  CO2 20 - 32 mmol/L _1 Calcium 8.6 - 10.4 mg/dL 9.6 9.1 9.0  Total Protein 6.1 - 8.1 g/dL 7.7 7.0 7.0  Total Bilirubin 0.2 - 1.2 mg/dL 0.2 0.3 0.2  Alkaline Phos 33 - 130 U/L - 45 43  AST 10 - 35 U/L _2 ALT 6 - 29 U/L _3 Imaging: No results found.  Speciality Comments: PLQ Eye Exam: 12/17/17 WNL @ [facility]    Procedures:  No procedures performed Allergies: Patient has no known allergies.   Assessment / Plan:     Visit Diagnoses: Seropositive rheumatoid arthritis of multiple sites ([place]) - RF 406,anti-CCP>250, ANA 1:320, ENA-, elevated ESR, history of synovitis: She has no synovitis on exam today. She has synovial thickening of right 2nd and 3rd MCP joints. She has not had any recent flares. She continues to take methotrexate 8 tablets weekly, folic acid 2 mg daily, and Plaquenil 200  mg twice daily.  She reports missing several doses of her methotrexate due to a recent upper respiratory tract infection about 3 weeks ago.  She states she is due for her next at the methotrexate tomorrow.  She she has concerns about the risk of immunosuppression with MTX and would like to discontinue MTX.  Autoimmune labs were ordered today per request of patient.   High risk medication use - MTX8 tablets by mouth every week, folic acid 2 tablets by mouth daily, PLQ 200 mg by mouth twice a day.  eye exam: 12/17/2017.  CBC and CMP were ordered today to monitor for drug toxicity.  She will return in June and every 3 months with lab work.  Primary insomnia -Her insomnia has improved.  She takes trazodone as needed at bedtime.   History of vitamin D deficiency: She continues to take vitamin D 50,000 units once weekly.  Vitamin D level was checked today.  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 We will check following labs today.   Orders: Orders Placed This  Encounter  Procedures  . COMPLETE METABOLIC PANEL WITH GFR  . CBC with Differential/Platelet  . Urinalysis, Routine w reflex microscopic  . ANA  . C3 and C4  . Anti-DNA antibody, double-stranded  . Sedimentation rate  . VITAMIN D 25 Hydroxy (Vit-D Deficiency, Fractures)   No orders of the defined types were placed in this encounter.    Follow-Up Instructions: Return in about 5 months (around 08/23/2018) for Rheumatoid arthritis.   Hazel Sams PA-C   I examined and evaluated the patient with Hazel Sams PA.  Patient had no synovitis on examination.  Have advised her to reduce her methotrexate to 6 tablets p.o. weekly.  If she has no flare she can reduce it further down to 4 tablets p.o. weekly.  If she has recurrence of symptoms she should increase the dose and notify us.  The plan of care was discussed as noted above.  Bo Merino, MD  Note - This record has been created using Editor, commissioning.  Chart creation errors have been sought, but may not always  have been located. Such creation errors do not reflect on  the standard of medical care.

## 2018-03-23 ENCOUNTER — Ambulatory Visit: Payer: BLUE CROSS/BLUE SHIELD | Admitting: Rheumatology

## 2018-03-23 ENCOUNTER — Encounter: Payer: Self-pay | Admitting: Rheumatology

## 2018-03-23 VITALS — BP 135/86 | HR 64 | Resp 16 | Ht 70.5 in | Wt 206.0 lb

## 2018-03-23 DIAGNOSIS — Z8679 Personal history of other diseases of the circulatory system: Secondary | ICD-10-CM | POA: Diagnosis not present

## 2018-03-23 DIAGNOSIS — M0579 Rheumatoid arthritis with rheumatoid factor of multiple sites without organ or systems involvement: Secondary | ICD-10-CM | POA: Diagnosis not present

## 2018-03-23 DIAGNOSIS — Z79899 Other long term (current) drug therapy: Secondary | ICD-10-CM | POA: Diagnosis not present

## 2018-03-23 DIAGNOSIS — F5101 Primary insomnia: Secondary | ICD-10-CM

## 2018-03-23 DIAGNOSIS — Z8639 Personal history of other endocrine, nutritional and metabolic disease: Secondary | ICD-10-CM

## 2018-03-24 ENCOUNTER — Telehealth: Payer: Self-pay | Admitting: *Deleted

## 2018-03-24 DIAGNOSIS — Z8639 Personal history of other endocrine, nutritional and metabolic disease: Secondary | ICD-10-CM

## 2018-03-24 MED ORDER — VITAMIN D (ERGOCALCIFEROL) 1.25 MG (50000 UNIT) PO CAPS: 50000.0000 [IU] | ORAL_CAPSULE | ORAL | 0 refills | Status: DC

## 2018-03-24 NOTE — Progress Notes (Signed)
Vit D 50,000 U twice  a wk #90 d. Recheck labs in 3 mths.

## 2018-03-24 NOTE — Telephone Encounter (Signed)
-----   Message from Pollyann SavoyShaili Deveshwar, MD sent at 03/24/2018  8:12 AM EDT ----- Vit D 50,000 U twice  a wk #90 d. Recheck labs in 3 mths.

## 2018-03-25 LAB — CBC WITH DIFFERENTIAL/PLATELET
BASOS ABS: 81 {cells}/uL (ref 0–200)
BASOS PCT: 1 %
EOS PCT: 2.5 %
Eosinophils Absolute: 203 cells/uL (ref 15–500)
HEMATOCRIT: 36.1 % (ref 35.0–45.0)
HEMOGLOBIN: 12.2 g/dL (ref 11.7–15.5)
LYMPHS ABS: 3337 {cells}/uL (ref 850–3900)
MCH: 28.8 pg (ref 27.0–33.0)
MCHC: 33.8 g/dL (ref 32.0–36.0)
MCV: 85.3 fL (ref 80.0–100.0)
MONOS PCT: 8.9 %
MPV: 10.1 fL (ref 7.5–12.5)
NEUTROS ABS: 3758 {cells}/uL (ref 1500–7800)
Neutrophils Relative %: 46.4 %
Platelets: 317 10*3/uL (ref 140–400)
RBC: 4.23 10*6/uL (ref 3.80–5.10)
RDW: 14.8 % (ref 11.0–15.0)
Total Lymphocyte: 41.2 %
WBC mixed population: 721 cells/uL (ref 200–950)
WBC: 8.1 10*3/uL (ref 3.8–10.8)

## 2018-03-25 LAB — COMPLETE METABOLIC PANEL WITH GFR
AG Ratio: 1.1 (calc) (ref 1.0–2.5)
ALT: 25 U/L (ref 6–29)
AST: 23 U/L (ref 10–35)
Albumin: 3.9 g/dL (ref 3.6–5.1)
Alkaline phosphatase (APISO): 70 U/L (ref 33–130)
BILIRUBIN TOTAL: 0.3 mg/dL (ref 0.2–1.2)
BUN/Creatinine Ratio: 7 (calc) (ref 6–22)
BUN: 6 mg/dL — AB (ref 7–25)
CHLORIDE: 105 mmol/L (ref 98–110)
CO2: 26 mmol/L (ref 20–32)
Calcium: 9.3 mg/dL (ref 8.6–10.4)
Creat: 0.91 mg/dL (ref 0.50–1.05)
GFR, EST AFRICAN AMERICAN: 85 mL/min/{1.73_m2} (ref >=60)
GFR, Est Non African American: 73 mL/min/{1.73_m2} (ref >=60)
GLUCOSE: 90 mg/dL (ref 65–99)
Globulin: 3.4 g/dL (calc) (ref 1.9–3.7)
POTASSIUM: 4.3 mmol/L (ref 3.5–5.3)
Sodium: 136 mmol/L (ref 135–146)
TOTAL PROTEIN: 7.3 g/dL (ref 6.1–8.1)

## 2018-03-25 LAB — SEDIMENTATION RATE: Sed Rate: 19 mm/h (ref 0–30)

## 2018-03-25 LAB — URINALYSIS, ROUTINE W REFLEX MICROSCOPIC
BILIRUBIN URINE: NEGATIVE
Glucose, UA: NEGATIVE
HGB URINE DIPSTICK: NEGATIVE
KETONES UR: NEGATIVE
LEUKOCYTES UA: NEGATIVE
Nitrite: NEGATIVE
PROTEIN: NEGATIVE
Specific Gravity, Urine: 1.01 (ref 1.001–1.03)
pH: 5.5 (ref 5.0–8.0)

## 2018-03-25 LAB — C3 AND C4
C3 Complement: 157 mg/dL (ref 83–193)
C4 Complement: 52 mg/dL (ref 15–57)

## 2018-03-25 LAB — VITAMIN D 25 HYDROXY (VIT D DEFICIENCY, FRACTURES): Vit D, 25-Hydroxy: 19 ng/mL — ABNORMAL LOW (ref 30–100)

## 2018-03-25 LAB — ANTI-NUCLEAR AB-TITER (ANA TITER): ANA Titer 1: 1:80 {titer} — ABNORMAL HIGH

## 2018-03-25 LAB — ANTI-DNA ANTIBODY, DOUBLE-STRANDED

## 2018-03-25 LAB — ANA: Anti Nuclear Antibody(ANA): POSITIVE — AB

## 2018-03-29 ENCOUNTER — Telehealth: Payer: Self-pay | Admitting: Rheumatology

## 2018-03-29 NOTE — Telephone Encounter (Signed)
Patient called stating she had questions regarding her lab results.

## 2018-03-29 NOTE — Telephone Encounter (Signed)
error 

## 2018-03-29 NOTE — Telephone Encounter (Signed)
Attempted to contact the patient and left message for patient to call the office.  

## 2018-04-19 ENCOUNTER — Other Ambulatory Visit: Payer: Self-pay | Admitting: Rheumatology

## 2018-04-19 NOTE — Telephone Encounter (Signed)
Last Visit: 03/23/18 Next Visit: 08/23/18 Labs: 03/23/18 ANA titer is low. All other autoimmune labs are WNL PLQ Eye Exam: 12/17/17 WNL  Okay to refill per Dr. Corliss Skainseveshwar

## 2018-04-30 ENCOUNTER — Other Ambulatory Visit: Payer: Self-pay | Admitting: Rheumatology

## 2018-04-30 NOTE — Telephone Encounter (Signed)
Last Visit: 03/23/18 Next Visit: 08/23/18  Okay to refill per Dr. Corliss Skains

## 2018-07-08 ENCOUNTER — Other Ambulatory Visit: Payer: Self-pay | Admitting: Rheumatology

## 2018-07-09 NOTE — Telephone Encounter (Signed)
Last Visit: 03/23/18 Next Visit: 08/23/18 Labs: 03/23/18 ANA titer is low. All other autoimmune labs are WNL PLQ Eye Exam: 12/17/17 WNL  Okay to refill per Dr. Corliss Skainseveshwar

## 2018-08-16 NOTE — Progress Notes (Signed)
Office Visit Note  Patient: Shelley Morrison             Date of Birth: 06-27-1966           MRN: [id number]             PCP: Javier Docker, MD Referring: Javier Docker, MD Visit Date: 08/23/2018 Occupation: '@GUAROCC'$ @  Subjective:  Fatigue   History of Present Illness: Shelley Morrison is a 52 y.o. female with history of seropositive rheumatoid arthritis.  Patient is on PLQ 200 mg 1 tablet po BID. Shelley Morrison is no longer taking MTX due to the risk of immunosuppression.  Shelley Morrison has not noticed any worsening joint pain since discontinuing.  Shelley Morrison has not had any recent rheumatoid arthritis flares.  Shelley Morrison denies any joint pain or joint swelling at this time.  Shelley Morrison states that Shelley Morrison has morning stiffness lasting 5 to 10 minutes most mornings.  Shelley Morrison states Shelley Morrison occasionally has some swelling in her right hand but denies any other joint pain or joint swelling.  Shelley Morrison denies missing any doses or any recent infections.  Shelley Morrison states that Shelley Morrison continues to have chronic fatigue but sleeps pretty well at night.    Activities of Daily Living:  Patient reports morning stiffness for 5-10  minutes.   Patient Denies nocturnal pain.  Difficulty dressing/grooming: Denies Difficulty climbing stairs: Denies Difficulty getting out of chair: Denies Difficulty using hands for taps, buttons, cutlery, and/or writing: Denies  Review of Systems  Constitutional: Negative for fatigue.  HENT: Negative for mouth sores, mouth dryness and nose dryness.   Eyes: Negative for pain, visual disturbance and dryness.  Respiratory: Negative for cough, hemoptysis, shortness of breath and difficulty breathing.   Cardiovascular: Negative for chest pain, palpitations, hypertension and swelling in legs/feet.  Gastrointestinal: Negative for blood in stool, constipation and diarrhea.  Endocrine: Negative for increased urination.  Genitourinary: Negative for painful urination.  Musculoskeletal: Negative for arthralgias, joint pain, joint swelling,  myalgias, muscle weakness, morning stiffness, muscle tenderness and myalgias.  Skin: Negative for color change, pallor, rash, hair loss, nodules/bumps, skin tightness, ulcers and sensitivity to sunlight.  Allergic/Immunologic: Negative for susceptible to infections.  Neurological: Negative for dizziness, numbness, headaches and weakness.  Hematological: Negative for swollen glands.  Psychiatric/Behavioral: Negative for depressed mood and sleep disturbance. The patient is not nervous/anxious.     PMFS History:  Patient Active Problem List   Diagnosis Date Noted  . Rheumatoid arthritis with rheumatoid factor of multiple sites without organ or systems involvement ([place]) 06/19/2017  . High risk medication use 06/18/2017  . Vitamin D deficiency 06/18/2017  . Primary insomnia 05/19/2017  . Essential hypertension 05/19/2017  . ANA positive 05/08/2017  . Rheumatoid factor positive 05/08/2017    Past Medical History:  Diagnosis Date  . Hypertension   . Lupus ([place])     Family History  Problem Relation Age of Onset  . Healthy Son   . Healthy Daughter    Past Surgical History:  Procedure Laterality Date  . CHOLECYSTECTOMY     Social History   Social History Narrative  . Not on file    Objective: Vital Signs: BP 136/88 (BP Location: Left Arm, Patient Position: Sitting, Cuff Size: Normal)   Pulse 86   Resp 15   Ht 5' 10.5" (1.791 m)   Wt 199 lb 3.2 oz (90.4 kg)   BMI 28.18 kg/m    Physical Exam  Constitutional: Shelley Morrison is oriented to person, place, and time. Shelley Morrison appears  well-developed and well-nourished.  HENT:  Head: Normocephalic and atraumatic.  Eyes: Conjunctivae and EOM are normal.  Neck: Normal range of motion.  Cardiovascular: Normal rate, regular rhythm, normal heart sounds and intact distal pulses.  Pulmonary/Chest: Effort normal and breath sounds normal.  Abdominal: Soft. Bowel sounds are normal.  Lymphadenopathy:    Shelley Morrison has no cervical adenopathy.  Neurological: Shelley Morrison  is alert and oriented to person, place, and time.  Skin: Skin is warm and dry. Capillary refill takes less than 2 seconds.  Psychiatric: Shelley Morrison has a normal mood and affect. Her behavior is normal.  Nursing note and vitals reviewed.    Musculoskeletal Exam: C-spine, thoracic spine, lumbar spine good range of motion.  No midline spinal tenderness.  No SI joint tenderness.  Shoulder joints, elbow joints and wrist joints, MCPs, PIPs, DIPs good range of motion with no synovitis.  CMC joint synovial thickening bilaterally.  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  Hip joints, knee joints, ankle joints good range of motion with no synovitis.  No warmth or effusion of bilateral knee joints.  Shelley Morrison has bilateral knee crepitus.  No tenderness of trochanteric bursa bilaterally on exam.  CDAI Exam: CDAI Score: 0.8  Patient Global Assessment: 4 (mm); Provider Global Assessment: 4 (mm) Swollen: 0 ; Tender: 0  Joint Exam   Not documented   There is currently no information documented on the homunculus. Go to the Rheumatology activity and complete the homunculus joint exam.  Investigation: No additional findings.  Imaging: No results found.  Recent Labs: Lab Results  Component Value Date   WBC 8.1 03/23/2018   HGB 12.2 03/23/2018   PLT 317 03/23/2018   NA 136 03/23/2018   K 4.3 03/23/2018   CL 105 03/23/2018   CO2 26 03/23/2018   GLUCOSE 90 03/23/2018   BUN 6 (L) 03/23/2018   CREATININE 0.91 03/23/2018   BILITOT 0.3 03/23/2018   ALKPHOS 45 08/24/2017   AST 23 03/23/2018   ALT 25 03/23/2018   PROT 7.3 03/23/2018   ALBUMIN 3.7 08/24/2017   CALCIUM 9.3 03/23/2018   GFRAA 85 03/23/2018    Speciality Comments: PLQ Eye Exam: 12/17/17 WNL @ [facility]  Procedures:  No procedures performed Allergies: Patient has no known allergies.   Assessment / Plan:     Visit Diagnoses: Seropositive rheumatoid arthritis of multiple sites ([place]) - RF  406,anti-CCP>250, ANA 1:320, ENA-, elevated ESR, history of synovitis: Shelley Morrison has no synovitis on exam. Shelley Morrison has synovial thickening of right 2nd and 3rd MCP joints. Shelley Morrison has not had any recent flares. Shelley Morrison discontinued MTX in March 2019 due to immunosuppression effects. Shelley Morrison has not noticed any worsening joint pain since discontinuing MTX.  Shelley Morrison is clinically doing well on PLQ 200 mg 1 tablet BID.  Shelley Morrison does not need any refills at this time.  We will schedule an ultrasound of both hands to assess for synovitis of right 2nd and 3rd MCP joints.  Shelley Morrison will follow up in 5 months.  Shelley Morrison was advised to notify us if Shelley Morrison develops increased joint pain or joint swelling.   High risk medication use - PLQ 200 mg by mouth twice a day. (discontinued MTX). CBC and CMP will be drawn today to monitor for drug toxicity.- Plan: CBC with Differential/Platelet, COMPLETE METABOLIC PANEL WITH GFR  Primary insomnia: Shelley Morrison has been sleeping better.  Shelley Morrison continues to have chronic fatigue.   History of vitamin D deficiency -Shelley Morrison finished the course of vitamin  D 50,000 units by mouth once a week in July 2019.  Vitamin D level will be checked today. Plan: VITAMIN D 25 Hydroxy (Vit-D Deficiency, Fractures)  History of hypertension: BP was 136/88.   Orders: Orders Placed This Encounter  Procedures  . CBC with Differential/Platelet  . VITAMIN D 25 Hydroxy (Vit-D Deficiency, Fractures)  . COMPLETE METABOLIC PANEL WITH GFR   No orders of the defined types were placed in this encounter.   Face-to-face time spent with patient was  minutes. Greater than 50% of time was spent in counseling and coordination of care.  Follow-Up Instructions: Return in about 5 months (around 01/23/2019) for Rheumatoid arthritis.   Ofilia Neas, PA-C   I examined and evaluated the patient with Hazel Sams PA.  Patient had some some synovial thickening over PIP joints.  Shelley Morrison has been experiencing some discomfort.  We will schedule ultrasound to evaluate this  further.  The plan of care was discussed as noted above.  Bo Merino, MD  Note - This record has been created using Editor, commissioning.  Chart creation errors have been sought, but may not always  have been located. Such creation errors do not reflect on  the standard of medical care.

## 2018-08-23 ENCOUNTER — Encounter: Payer: Self-pay | Admitting: Rheumatology

## 2018-08-23 ENCOUNTER — Ambulatory Visit: Payer: BLUE CROSS/BLUE SHIELD | Admitting: Rheumatology

## 2018-08-23 VITALS — BP 136/88 | HR 86 | Resp 15 | Ht 70.5 in | Wt 199.2 lb

## 2018-08-23 DIAGNOSIS — F5101 Primary insomnia: Secondary | ICD-10-CM | POA: Diagnosis not present

## 2018-08-23 DIAGNOSIS — Z79899 Other long term (current) drug therapy: Secondary | ICD-10-CM | POA: Diagnosis not present

## 2018-08-23 DIAGNOSIS — M0579 Rheumatoid arthritis with rheumatoid factor of multiple sites without organ or systems involvement: Secondary | ICD-10-CM | POA: Diagnosis not present

## 2018-08-23 DIAGNOSIS — Z8679 Personal history of other diseases of the circulatory system: Secondary | ICD-10-CM

## 2018-08-23 DIAGNOSIS — Z8639 Personal history of other endocrine, nutritional and metabolic disease: Secondary | ICD-10-CM | POA: Diagnosis not present

## 2018-08-23 NOTE — Patient Instructions (Signed)
*   Your Plaquenil eye exam is due in December.   Standing Labs We placed an order today for your standing lab work.    Please come back and get your standing labs in 3 months (December).  We have open lab Monday through Friday from 8:30-11:30 AM and 1:30-4:00 PM  at the office of Dr. Pollyann SavoyShaili Deveshwar.   You may experience shorter wait times on Monday and Friday afternoons. The office is located at [address], [address], [place], [place] [address] No appointment is necessary.   Labs are drawn by [facility].  You may receive a bill from [facility] for your lab work. If you have any questions regarding directions or hours of operation,  please call [phone].

## 2018-08-24 ENCOUNTER — Telehealth: Payer: Self-pay | Admitting: Rheumatology

## 2018-08-24 ENCOUNTER — Telehealth: Payer: Self-pay | Admitting: *Deleted

## 2018-08-24 DIAGNOSIS — E559 Vitamin D deficiency, unspecified: Secondary | ICD-10-CM

## 2018-08-24 LAB — VITAMIN D 25 HYDROXY (VIT D DEFICIENCY, FRACTURES): Vit D, 25-Hydroxy: 24 ng/mL — ABNORMAL LOW (ref 30–100)

## 2018-08-24 LAB — COMPLETE METABOLIC PANEL WITH GFR
AG RATIO: 1.1 (calc) (ref 1.0–2.5)
ALKALINE PHOSPHATASE (APISO): 66 U/L (ref 33–130)
ALT: 16 U/L (ref 6–29)
AST: 17 U/L (ref 10–35)
Albumin: 4.1 g/dL (ref 3.6–5.1)
BILIRUBIN TOTAL: 0.4 mg/dL (ref 0.2–1.2)
BUN: 7 mg/dL (ref 7–25)
CHLORIDE: 105 mmol/L (ref 98–110)
CO2: 30 mmol/L (ref 20–32)
Calcium: 9.6 mg/dL (ref 8.6–10.4)
Creat: 0.9 mg/dL (ref 0.50–1.05)
GFR, EST AFRICAN AMERICAN: 86 mL/min/{1.73_m2} (ref >=60)
GFR, Est Non African American: 74 mL/min/{1.73_m2} (ref >=60)
GLUCOSE: 111 mg/dL — AB (ref 65–99)
Globulin: 3.6 g/dL (calc) (ref 1.9–3.7)
POTASSIUM: 3.8 mmol/L (ref 3.5–5.3)
Sodium: 139 mmol/L (ref 135–146)
TOTAL PROTEIN: 7.7 g/dL (ref 6.1–8.1)

## 2018-08-24 LAB — CBC WITH DIFFERENTIAL/PLATELET
BASOS ABS: 51 {cells}/uL (ref 0–200)
Basophils Relative: 0.9 %
EOS ABS: 160 {cells}/uL (ref 15–500)
Eosinophils Relative: 2.8 %
HCT: 38 % (ref 35.0–45.0)
HEMOGLOBIN: 12.4 g/dL (ref 11.7–15.5)
Lymphs Abs: 2109 cells/uL (ref 850–3900)
MCH: 27.5 pg (ref 27.0–33.0)
MCHC: 32.6 g/dL (ref 32.0–36.0)
MCV: 84.3 fL (ref 80.0–100.0)
MPV: 10.5 fL (ref 7.5–12.5)
Monocytes Relative: 8 %
Neutro Abs: 2924 cells/uL (ref 1500–7800)
Neutrophils Relative %: 51.3 %
Platelets: 270 10*3/uL (ref 140–400)
RBC: 4.51 10*6/uL (ref 3.80–5.10)
RDW: 13.2 % (ref 11.0–15.0)
Total Lymphocyte: 37 %
WBC: 5.7 10*3/uL (ref 3.8–10.8)
WBCMIX: 456 {cells}/uL (ref 200–950)

## 2018-08-24 MED ORDER — VITAMIN D (ERGOCALCIFEROL) 1.25 MG (50000 UNIT) PO CAPS: 50000.0000 [IU] | ORAL_CAPSULE | ORAL | 0 refills | Status: DC

## 2018-08-24 NOTE — Telephone Encounter (Signed)
-----   Message from Gearldine Bienenstockaylor M Dale, PA-C sent at 08/24/2018  8:09 AM EDT ----- Vitamin D is low.  Please send in vitamin D 50,000 units by mouth once a week for 3 months.  We will recheck vitamin D in 3 months.   Glucose is 111. All other labs are WNL.

## 2018-08-24 NOTE — Telephone Encounter (Signed)
Patient called stating she forgot to mention at her appointment yesterday that her vision has changed "tremendously" in the last 6 months according to her eye doctor.  Patient is checking if this could be caused by her medication.

## 2018-08-24 NOTE — Progress Notes (Signed)
Vitamin D is low.  Please send in vitamin D 50,000 units by mouth once a week for 3 months.  We will recheck vitamin D in 3 months.   Glucose is 111. All other labs are WNL.

## 2018-08-24 NOTE — Telephone Encounter (Signed)
Spoke with patient and advised we would need office notes from eye doctor to review. Patient will have them fax the notes.

## 2018-08-27 NOTE — Telephone Encounter (Signed)
Patient advised that her eye doctor did not mention that the PLQ was causing any problems and that there was not a field of vision test down. At this time she may continue PLQ.

## 2018-09-08 ENCOUNTER — Other Ambulatory Visit: Payer: BLUE CROSS/BLUE SHIELD | Admitting: Rheumatology

## 2018-11-06 ENCOUNTER — Other Ambulatory Visit: Payer: Self-pay | Admitting: Rheumatology

## 2018-11-08 NOTE — Telephone Encounter (Signed)
Last Visit :08/23/18 Next Visit: 01/26/19 Labs: 08/23/18 Glucose is 111. All other labs are WNL. PLQ Eye Exam: 12/17/17 WNL  Okay to refill per Corliss Skains

## 2019-01-12 NOTE — Progress Notes (Deleted)
Office Visit Note  Patient: Shelley Morrison             Date of Birth: 05/23/1966           MRN: [id number]             PCP: Gilda Crease, MD Referring: Gilda Crease, MD Visit Date: 01/26/2019 Occupation: @GUAROCC @  Subjective:  No chief complaint on file.   History of Present Illness: Shelley Morrison is a 53 y.o. female ***   Activities of Daily Living:  Patient reports morning stiffness for *** {minute/hour:19697}.   Patient {ACTIONS;DENIES/REPORTS:21021675::"Denies"} nocturnal pain.  Difficulty dressing/grooming: {ACTIONS;DENIES/REPORTS:21021675::"Denies"} Difficulty climbing stairs: {ACTIONS;DENIES/REPORTS:21021675::"Denies"} Difficulty getting out of chair: {ACTIONS;DENIES/REPORTS:21021675::"Denies"} Difficulty using hands for taps, buttons, cutlery, and/or writing: {ACTIONS;DENIES/REPORTS:21021675::"Denies"}  No Rheumatology ROS completed.   PMFS History:  Patient Active Problem List   Diagnosis Date Noted  . 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 High risk medication use 06/18/2017  . Vitamin D deficiency 06/18/2017  . Primary insomnia 05/19/2017  . Essential hypertension 05/19/2017  . ANA positive 05/08/2017  . Rheumatoid factor positive 05/08/2017    Past Medical History:  Diagnosis Date  . Hypertension   . Lupus ([facility])     Family History  Problem Relation Age of Onset  . Healthy Son   . Healthy Daughter    Past Surgical History:  Procedure Laterality Date  . CHOLECYSTECTOMY     Social History   Social History Narrative  . Not on file    There is no immunization history on file for this patient.   Objective: Vital Signs: There were no vitals taken for this visit.   Physical Exam   Musculoskeletal Exam: ***  CDAI Exam: CDAI Score: Not documented Patient Global Assessment: Not documented; Provider Global Assessment: Not documented Swollen: Not  documented; Tender: Not documented Joint Exam   Not documented   There is currently no information documented on the homunculus. Go to the Rheumatology activity and complete the homunculus joint exam.  Investigation: No additional findings.  Imaging: No results found.  Recent Labs: Lab Results  Component Value Date   WBC 5.7 08/23/2018   HGB 12.4 08/23/2018   PLT 270 08/23/2018   NA 139 08/23/2018   K 3.8 08/23/2018   CL 105 08/23/2018   CO2 30 08/23/2018   GLUCOSE 111 (H) 08/23/2018   BUN 7 08/23/2018   CREATININE 0.90 08/23/2018   BILITOT 0.4 08/23/2018   ALKPHOS 45 08/24/2017   AST 17 08/23/2018   ALT 16 08/23/2018   PROT 7.7 08/23/2018   ALBUMIN 3.7 08/24/2017   CALCIUM 9.6 08/23/2018   GFRAA 86 08/23/2018    Speciality Comments: PLQ Eye Exam: 12/17/17 WNL @ [facility]  Procedures:  No procedures performed Allergies: Patient has no known allergies.   Assessment / Plan:     Visit Diagnoses: No diagnosis found.   Orders: No orders of the defined types were placed in this encounter.  No orders of the defined types were placed in this encounter.   Face-to-face time spent with patient was *** minutes. Greater than 50% of time was spent in counseling and coordination of care.  Follow-Up Instructions: No follow-ups on file.   Ellen Henri, CMA  Note - This record has been created using Animal nutritionist.  Chart creation errors have been sought, but may not always  have been located. Such creation errors do not reflect on  the standard of medical care. 

## 2019-01-21 NOTE — Progress Notes (Signed)
Office Visit Note  Patient: Shelley Morrison             Date of Birth: May 28, 1966           MRN: [id number]             PCP: Javier Docker, MD Referring: Javier Docker, MD Visit Date: 01/24/2019 Occupation: _0 @  Subjective:  Medication management.   History of Present Illness: Shelley Morrison is a 53 y.o. female history of sero positive rheumatoid arthritis.  She states she came off methotrexate long time ago.  She recently has been taking Plaquenil only twice a day about 2 times a week.  She reports swelling in her hands off and on.  She does not have much discomfort.  None of the other joints are painful.  Activities of Daily Living:  Patient reports morning stiffness for 5 minutes.   Patient Denies nocturnal pain.  Difficulty dressing/grooming: Denies Difficulty climbing stairs: Denies Difficulty getting out of chair: Denies Difficulty using hands for taps, buttons, cutlery, and/or writing: Denies  Review of Systems  Constitutional: Negative for fatigue.  HENT: Negative for mouth sores, mouth dryness and nose dryness.   Eyes: Positive for dryness. Negative for pain and itching.  Respiratory: Negative for shortness of breath, wheezing and difficulty breathing.   Cardiovascular: Negative for chest pain, palpitations and swelling in legs/feet.  Gastrointestinal: Negative for abdominal pain, constipation, diarrhea, nausea and vomiting.  Endocrine: Negative for increased urination.  Genitourinary: Negative for painful urination, nocturia and pelvic pain.  Musculoskeletal: Positive for joint swelling and morning stiffness. Negative for arthralgias and joint pain.  Skin: Negative for rash and redness.  Allergic/Immunologic: Negative for susceptible to infections.  Neurological: Negative for dizziness, fainting, light-headedness, headaches, memory loss and weakness.  Hematological: Positive for bruising/bleeding tendency.  Psychiatric/Behavioral: Negative for confusion  and sleep disturbance. The patient is not nervous/anxious.     PMFS History:  Patient Active Problem List   Diagnosis Date Noted  . Rheumatoid arthritis with rheumatoid factor of multiple sites without organ or systems involvement ([place]) 06/19/2017  . High risk medication use 06/18/2017  . Vitamin D deficiency 06/18/2017  . Primary insomnia 05/19/2017  . Essential hypertension 05/19/2017  . ANA positive 05/08/2017  . Rheumatoid factor positive 05/08/2017    Past Medical History:  Diagnosis Date  . Hypertension   . Lupus ([place])     Family History  Problem Relation Age of Onset  . Healthy Son   . Healthy Daughter    Past Surgical History:  Procedure Laterality Date  . CHOLECYSTECTOMY     Social History   Social History Narrative  . Not on file    There is no immunization history on file for this patient.   Objective: Vital Signs: BP 139/89 (BP Location: Left Arm, Patient Position: Sitting, Cuff Size: Normal)   Pulse 70   Resp 14   Ht 5' 10" (1.778 m)   Wt 197 lb (89.4 kg)   BMI 28.27 kg/m    Physical Exam Vitals signs and nursing note reviewed.  Constitutional:      Appearance: She is well-developed.  HENT:     Head: Normocephalic and atraumatic.  Eyes:     Conjunctiva/sclera: Conjunctivae normal.  Neck:     Musculoskeletal: Normal range of motion.  Cardiovascular:     Rate and Rhythm: Normal rate and regular rhythm.     Heart sounds: Normal heart sounds.  Pulmonary:     Effort: Pulmonary effort  is normal.     Breath sounds: Normal breath sounds.  Abdominal:     General: Bowel sounds are normal.     Palpations: Abdomen is soft.  Lymphadenopathy:     Cervical: No cervical adenopathy.  Skin:    General: Skin is warm and dry.     Capillary Refill: Capillary refill takes less than 2 seconds.  Neurological:     Mental Status: She is alert and oriented to person, place, and time.  Psychiatric:        Behavior: Behavior normal.      Musculoskeletal  Exam: C-spine thoracic lumbar spine good range of motion.  Shoulder joints elbow joints wrist joints with good range of motion.  She has some thickening over second and third MCP joint but no synovitis was noted.  PIP and DIP thickening was noted.  Hip joints, knee joints, ankles MTPs PIPs been good range of motion with no synovitis.  CDAI Exam: CDAI Score: 0.4  Patient Global Assessment: 2 (mm); Provider Global Assessment: 2 (mm) Swollen: 0 ; Tender: 0  Joint Exam   Not documented   There is currently no information documented on the homunculus. Go to the Rheumatology activity and complete the homunculus joint exam.  Investigation: No additional findings.  Imaging: Xr Foot 2 Views Left  Result Date: 01/24/2019 First MTP, PIP and DIP narrowing was noted.  No erosive changes were noted.  No intertarsal or tibiotalar joint space narrowing was noted.  A calcaneal spur was noted.  No radiographic progression was noted. Impression: These findings are consistent with osteoarthritis of the foot.  Xr Foot 2 Views Right  Result Date: 01/24/2019 First MTP, PIP and DIP narrowing was noted.  No erosive changes were noted.  No intertarsal or tibiotalar joint space narrowing was noted.  Posterior and inferior calcaneal spurs were noted.  No radiographic progression was noted. Impression: These findings are consistent with osteoarthritis of the foot.  Xr Hand 2 View Left  Result Date: 01/24/2019 Mild second MCP joint narrowing was noted.  PIP narrowing was noted.  No intercarpal radiocarpal joint space narrowing was noted.  Juxta-articular osteopenia was noted.  CMC narrowing was noted.  No erosive changes were noted.  No radiographic progression was noted and compared to the films of May 19, 2017. 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  Xr Hand 2 View Right  Result Date: 01/24/2019 Mild second and third MCP joint narrowing was noted.  PIP joint  narrowing was noted.  Juxta-articular osteopenia was noted.  No significant intercarpal radiocarpal joint space narrowing was noted.  No erosive changes were noted.  No radiographic progression was noted from the films of May 2018. 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   Recent Labs: Lab Results  Component Value Date   WBC 5.7 08/23/2018   HGB 12.4 08/23/2018   PLT 270 08/23/2018   NA 139 08/23/2018   K 3.8 08/23/2018   CL 105 08/23/2018   CO2 30 08/23/2018   GLUCOSE 111 (H) 08/23/2018   BUN 7 08/23/2018   CREATININE 0.90 08/23/2018   BILITOT 0.4 08/23/2018   ALKPHOS 45 08/24/2017   AST 17 08/23/2018   ALT 16 08/23/2018   PROT 7.7 08/23/2018   ALBUMIN 3.7 08/24/2017   CALCIUM 9.6 08/23/2018   GFRAA 86 08/23/2018    Speciality Comments: PLQ Eye Exam: 1/ 15/20 WNL @ [facility] Follow up in 6 months.   Procedures:  No procedures performed Allergies: Patient has no known allergies.   Assessment / Plan:     Visit Diagnoses: Seropositive rheumatoid arthritis of multiple sites ([place]) - RF 406,anti-CCP>250, ANA 1:320, ENA-, elevated ESR, history of synovitis -patient gives history of intermittent swelling in her hands.  She states she has been taking Plaquenil only twice a week.  She has been off methotrexate for a long time.  She has synovial thickening but not much synovitis on examination.  She has mild tenderness over her MCPs.  I will obtain x-rays to look for radiographic progression.  Plan: XR Hand 2 View Right, XR Hand 2 View Left, XR Foot 2 Views Right, XR Foot 2 Views Left.  No erosive changes were noted.  No radiographic progression was noted from the films of May 19, 2017.  I discussed the need of taking Plaquenil on twice daily basis as she has been having intermittent swelling and discomfort.  She was in agreement.  High risk medication use - PLQ 200 mg by mouth twice a day.(Patient is taking only twice a  week. ) (discontinued MTX).  Last Plaquenil eye exam normal on 01/12/2019.  Most recent CBC/CMP within normal limits on 08/23/2018.  Due for CBC/CMP today and will monitor every 5 months.  Standing orders are in place. Recommend annual influenza, Pneumovax 23, Prevnar 13, and Shingrix as indicated.- Plan: CBC with Differential/Platelet, COMPLETE METABOLIC PANEL WITH GFR  Pain in both hands-she continues to experience pain and swelling intermittently.  There was no synovitis on examination today.  Primary insomnia  History of hypertension  History of vitamin D deficiency -patient is currently not taking any supplements.  Plan: VITAMIN D 25 Hydroxy (Vit-D Deficiency, Fractures)   Orders: Orders Placed This Encounter  Procedures  . XR Hand 2 View Right  . XR Hand 2 View Left  . XR Foot 2 Views Right  . XR Foot 2 Views Left  . CBC with Differential/Platelet  . COMPLETE METABOLIC PANEL WITH GFR  . VITAMIN D 25 Hydroxy (Vit-D Deficiency, Fractures)   No orders of the defined types were placed in this encounter.   Face-to-face time spent with patient was 30 minutes. Greater than 50% of time was spent in counseling and coordination of care.  Follow-Up Instructions: Return in about 5 months (around 06/25/2019) for Rheumatoid arthritis.   Bo Merino, MD  Note - This record has been created using Editor, commissioning.  Chart creation errors have been sought, but may not always  have been located. Such creation errors do not reflect on  the standard of medical care.

## 2019-01-24 ENCOUNTER — Ambulatory Visit (INDEPENDENT_AMBULATORY_CARE_PROVIDER_SITE_OTHER): Payer: Self-pay

## 2019-01-24 ENCOUNTER — Ambulatory Visit: Payer: BLUE CROSS/BLUE SHIELD | Admitting: Rheumatology

## 2019-01-24 ENCOUNTER — Encounter: Payer: Self-pay | Admitting: Rheumatology

## 2019-01-24 VITALS — BP 139/89 | HR 70 | Resp 14 | Ht 70.0 in | Wt 197.0 lb

## 2019-01-24 DIAGNOSIS — F5101 Primary insomnia: Secondary | ICD-10-CM

## 2019-01-24 DIAGNOSIS — M79642 Pain in left hand: Secondary | ICD-10-CM | POA: Diagnosis not present

## 2019-01-24 DIAGNOSIS — M0579 Rheumatoid arthritis with rheumatoid factor of multiple sites without organ or systems involvement: Secondary | ICD-10-CM

## 2019-01-24 DIAGNOSIS — Z79899 Other long term (current) drug therapy: Secondary | ICD-10-CM | POA: Diagnosis not present

## 2019-01-24 DIAGNOSIS — Z8639 Personal history of other endocrine, nutritional and metabolic disease: Secondary | ICD-10-CM

## 2019-01-24 DIAGNOSIS — M79641 Pain in right hand: Secondary | ICD-10-CM

## 2019-01-24 DIAGNOSIS — Z8679 Personal history of other diseases of the circulatory system: Secondary | ICD-10-CM

## 2019-01-25 ENCOUNTER — Telehealth: Payer: Self-pay | Admitting: *Deleted

## 2019-01-25 DIAGNOSIS — E559 Vitamin D deficiency, unspecified: Secondary | ICD-10-CM

## 2019-01-25 LAB — COMPLETE METABOLIC PANEL WITH GFR
AG Ratio: 1.2 (calc) (ref 1.0–2.5)
ALBUMIN MSPROF: 4.3 g/dL (ref 3.6–5.1)
ALT: 14 U/L (ref 6–29)
AST: 16 U/L (ref 10–35)
Alkaline phosphatase (APISO): 76 U/L (ref 33–130)
BILIRUBIN TOTAL: 0.3 mg/dL (ref 0.2–1.2)
BUN: 9 mg/dL (ref 7–25)
CO2: 28 mmol/L (ref 20–32)
CREATININE: 0.89 mg/dL (ref 0.50–1.05)
Calcium: 9.8 mg/dL (ref 8.6–10.4)
Chloride: 106 mmol/L (ref 98–110)
GFR, Est African American: 86 mL/min/{1.73_m2} (ref >=60)
GFR, Est Non African American: 75 mL/min/{1.73_m2} (ref >=60)
GLUCOSE: 116 mg/dL — AB (ref 65–99)
Globulin: 3.6 g/dL (calc) (ref 1.9–3.7)
Potassium: 4 mmol/L (ref 3.5–5.3)
Sodium: 140 mmol/L (ref 135–146)
TOTAL PROTEIN: 7.9 g/dL (ref 6.1–8.1)

## 2019-01-25 LAB — CBC WITH DIFFERENTIAL/PLATELET
ABSOLUTE MONOCYTES: 462 {cells}/uL (ref 200–950)
BASOS ABS: 29 {cells}/uL (ref 0–200)
Basophils Relative: 0.7 %
EOS ABS: 71 {cells}/uL (ref 15–500)
Eosinophils Relative: 1.7 %
HCT: 38.2 % (ref 35.0–45.0)
Hemoglobin: 12.4 g/dL (ref 11.7–15.5)
Lymphs Abs: 1525 cells/uL (ref 850–3900)
MCH: 27.2 pg (ref 27.0–33.0)
MCHC: 32.5 g/dL (ref 32.0–36.0)
MCV: 83.8 fL (ref 80.0–100.0)
MONOS PCT: 11 %
MPV: 10.4 fL (ref 7.5–12.5)
NEUTROS PCT: 50.3 %
Neutro Abs: 2113 cells/uL (ref 1500–7800)
PLATELETS: 278 10*3/uL (ref 140–400)
RBC: 4.56 10*6/uL (ref 3.80–5.10)
RDW: 13 % (ref 11.0–15.0)
TOTAL LYMPHOCYTE: 36.3 %
WBC: 4.2 10*3/uL (ref 3.8–10.8)

## 2019-01-25 LAB — VITAMIN D 25 HYDROXY (VIT D DEFICIENCY, FRACTURES): VIT D 25 HYDROXY: 17 ng/mL — AB (ref 30–100)

## 2019-01-25 MED ORDER — VITAMIN D (ERGOCALCIFEROL) 1.25 MG (50000 UNIT) PO CAPS: 50000.0000 [IU] | ORAL_CAPSULE | ORAL | 0 refills | Status: DC

## 2019-01-25 NOTE — Telephone Encounter (Signed)
-----   Message from Gearldine Bienenstock, PA-C sent at 01/25/2019  8:15 AM EST ----- CBC WNL. Glucose is 116. Rest of CMP WNL. Vitamin D is low-17. Please notify patient and send in vitamin D 50,000 units by mouth twice weekly for 3 months.  We will recheck in 3 months.

## 2019-01-26 ENCOUNTER — Ambulatory Visit: Payer: BLUE CROSS/BLUE SHIELD | Admitting: Rheumatology

## 2019-03-14 ENCOUNTER — Other Ambulatory Visit: Payer: Self-pay | Admitting: Rheumatology

## 2019-03-14 NOTE — Telephone Encounter (Signed)
Last Visit: 01/24/19 Next Visit: 06/20/19 Labs: 01/24/19 CBC WNL. Glucose is 116. Rest of CMP WNL.  PLQ Eye Exam: 1/ 15/20 WNL   Okay to refill per Dr. Corliss Skains

## 2019-04-15 ENCOUNTER — Other Ambulatory Visit: Payer: Self-pay | Admitting: *Deleted

## 2019-04-15 ENCOUNTER — Telehealth: Payer: Self-pay | Admitting: Rheumatology

## 2019-04-15 DIAGNOSIS — E559 Vitamin D deficiency, unspecified: Secondary | ICD-10-CM

## 2019-04-15 NOTE — Telephone Encounter (Signed)
Patient going to [facility] on [address] Monday 04/18/2019 for labs. Please release orders. Patient would like to discuss Vit D once results come back. Please advise her at that time.

## 2019-04-15 NOTE — Telephone Encounter (Signed)
Lab orders released.  

## 2019-04-19 ENCOUNTER — Telehealth: Payer: Self-pay | Admitting: Rheumatology

## 2019-04-19 LAB — VITAMIN D 25 HYDROXY (VIT D DEFICIENCY, FRACTURES): Vit D, 25-Hydroxy: 44 ng/mL (ref 30–100)

## 2019-04-19 NOTE — Telephone Encounter (Signed)
Returned patient's call and advised her to take OTC vitamin D 1,000 or 2,000 units daily. Patient verbalized understanding.

## 2019-04-19 NOTE — Telephone Encounter (Signed)
Patient left a message, stating she had questions about Vit D. Patient needs to know how much units to take, and if this is daily? Patient states leave a message with these answers if she does not answer.

## 2019-05-19 NOTE — Progress Notes (Deleted)
Office Visit Note  Patient: Shelley Morrison             Date of Birth: 1966/11/03           MRN: [id number]             PCP: Javier Docker, MD Referring: Javier Docker, MD Visit Date: 05/27/2019 Occupation: _0 @  Subjective:  No chief complaint on file.  Plaquenil 200 mg twice daily.  Last Plaquenil eye exam normal on 01/12/2019.  Most recent CBC/CMP within normal limits on 01/24/2019.  Next CBC/CMP due the end of June and will monitor every 5 months.  History of Present Illness: Shelley Morrison is a 53 y.o. female ***   Activities of Daily Living:  Patient reports morning stiffness for *** {minute/hour:19697}.   Patient {ACTIONS;DENIES/REPORTS:21021675::"Denies"} nocturnal pain.  Difficulty dressing/grooming: {ACTIONS;DENIES/REPORTS:21021675::"Denies"} Difficulty climbing stairs: {ACTIONS;DENIES/REPORTS:21021675::"Denies"} Difficulty getting out of chair: {ACTIONS;DENIES/REPORTS:21021675::"Denies"} Difficulty using hands for taps, buttons, cutlery, and/or writing: {ACTIONS;DENIES/REPORTS:21021675::"Denies"}  No Rheumatology ROS completed.   PMFS History:  Patient Active Problem List   Diagnosis Date Noted  . Rheumatoid arthritis with rheumatoid factor of multiple sites without organ or systems involvement ([place]) 06/19/2017  . High risk medication use 06/18/2017  . Vitamin D deficiency 06/18/2017  . Primary insomnia 05/19/2017  . Essential hypertension 05/19/2017  . ANA positive 05/08/2017  . Rheumatoid factor positive 05/08/2017    Past Medical History:  Diagnosis Date  . Hypertension   . Lupus ([place])     Family History  Problem Relation Age of Onset  . Healthy Son   . Healthy Daughter    Past Surgical History:  Procedure Laterality Date  . CHOLECYSTECTOMY     Social History   Social History Narrative  . Not on file    There is no immunization history on file for this patient.   Objective: Vital Signs: There were no vitals taken for this visit.    Physical Exam   Musculoskeletal Exam: ***  CDAI Exam: CDAI Score: Not documented Patient Global Assessment: Not documented; Provider Global Assessment: Not documented Swollen: Not documented; Tender: Not documented Joint Exam   Not documented   There is currently no information documented on the homunculus. Go to the Rheumatology activity and complete the homunculus joint exam.  Investigation: No additional findings.  Imaging: No results found.  Recent Labs: Lab Results  Component Value Date   WBC 4.2 01/24/2019   HGB 12.4 01/24/2019   PLT 278 01/24/2019   NA 140 01/24/2019   K 4.0 01/24/2019   CL 106 01/24/2019   CO2 28 01/24/2019   GLUCOSE 116 (H) 01/24/2019   BUN 9 01/24/2019   CREATININE 0.89 01/24/2019   BILITOT 0.3 01/24/2019   ALKPHOS 45 08/24/2017   AST 16 01/24/2019   ALT 14 01/24/2019   PROT 7.9 01/24/2019   ALBUMIN 3.7 08/24/2017   CALCIUM 9.8 01/24/2019   GFRAA 86 01/24/2019    Speciality Comments: PLQ Eye Exam: 1/ 15/20 WNL @ [facility] Follow up in 6 months.   Procedures:  No procedures performed Allergies: Patient has no known allergies.   Assessment / Plan:     Visit Diagnoses: Seropositive rheumatoid arthritis of multiple sites ([place]) - RF 406,anti-CCP>250, ANA 1:320, ENA-, elevated ESR, history of synovitis   High risk medication use - PLQ 200 mg by mouth twice a day.(Patient is taking only twice a week. ) (discontinued MTX).  Last Plaquenil eye exam normal on 01/12/2019.    Vitamin D  deficiency  ANA positive  History of hypertension   Orders: No orders of the defined types were placed in this encounter.  No orders of the defined types were placed in this encounter.   Face-to-face time spent with patient was *** minutes. Greater than 50% of time was spent in counseling and coordination of care.  Follow-Up Instructions: No follow-ups on file.   Taylor M Dale, PA-C  Note - This record has been created using Dragon  software.  Chart creation errors have been sought, but may not always  have been located. Such creation errors do not reflect on  the standard of medical care. 

## 2019-05-27 ENCOUNTER — Ambulatory Visit: Payer: BLUE CROSS/BLUE SHIELD | Admitting: Physician Assistant

## 2019-06-01 NOTE — Progress Notes (Deleted)
Office Visit Note  Patient: Shelley Morrison             Date of Birth: [birth year]           MRN: [id number]             PCP: Gilda Crease, MD Referring: Gilda Crease, MD Visit Date: 06/15/2019 Occupation: @GUAROCC @  Subjective:  No chief complaint on file.  Plaquenil 200 mg 1 tablet twice daily.  Last Plaquenil eye exam normal on 01/12/2019.  Most recent CBC/CMP within normal limits except for elevated glucose on 01/24/2019.  Due for CBC/CMP today and will monitor every 5 months.  History of Present Illness: Shelley Morrison is a 53 y.o. female ***   Activities of Daily Living:  Patient reports morning stiffness for *** {minute/hour:19697}.   Patient {ACTIONS;DENIES/REPORTS:21021675::"Denies"} nocturnal pain.  Difficulty dressing/grooming: {ACTIONS;DENIES/REPORTS:21021675::"Denies"} Difficulty climbing stairs: {ACTIONS;DENIES/REPORTS:21021675::"Denies"} Difficulty getting out of chair: {ACTIONS;DENIES/REPORTS:21021675::"Denies"} Difficulty using hands for taps, buttons, cutlery, and/or writing: {ACTIONS;DENIES/REPORTS:21021675::"Denies"}  No Rheumatology ROS completed.   PMFS History:  Patient Active Problem List   Diagnosis Date Noted  . 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 High risk medication use 06/18/2017  . Vitamin D deficiency 06/18/2017  . Primary insomnia 05/19/2017  . Essential hypertension 05/19/2017  . ANA positive 05/08/2017  . Rheumatoid factor positive 05/08/2017    Past Medical History:  Diagnosis Date  . Hypertension   . Lupus ([facility])     Family History  Problem Relation Age of Onset  . Healthy Son   . Healthy Daughter    Past Surgical History:  Procedure Laterality Date  . CHOLECYSTECTOMY     Social History   Social History Narrative  . Not on file    There is no immunization history on file for this patient.   Objective: Vital Signs: There were no  vitals taken for this visit.   Physical Exam   Musculoskeletal Exam: ***  CDAI Exam: CDAI Score: Not documented Patient Global Assessment: Not documented; Provider Global Assessment: Not documented Swollen: Not documented; Tender: Not documented Joint Exam   Not documented   There is currently no information documented on the homunculus. Go to the Rheumatology activity and complete the homunculus joint exam.  Investigation: No additional findings.  Imaging: No results found.  Recent Labs: Lab Results  Component Value Date   WBC 4.2 01/24/2019   HGB 12.4 01/24/2019   PLT 278 01/24/2019   NA 140 01/24/2019   K 4.0 01/24/2019   CL 106 01/24/2019   CO2 28 01/24/2019   GLUCOSE 116 (H) 01/24/2019   BUN 9 01/24/2019   CREATININE 0.89 01/24/2019   BILITOT 0.3 01/24/2019   ALKPHOS 45 08/24/2017   AST 16 01/24/2019   ALT 14 01/24/2019   PROT 7.9 01/24/2019   ALBUMIN 3.7 08/24/2017   CALCIUM 9.8 01/24/2019   GFRAA 86 01/24/2019    Speciality Comments: PLQ Eye Exam: 1/ 15/20 WNL @ [facility] Follow up in 6 months.   Procedures:  No procedures performed Allergies: Patient has no known allergies.   Assessment / Plan:     Visit Diagnoses: No diagnosis found.   Orders: No orders of the defined types were placed in this encounter.  No orders of the defined types were placed in this encounter.   Face-to-face time spent with patient was *** minutes. Greater than 50% of time was spent in counseling and coordination of care.  Follow-Up Instructions: No follow-ups on file.   Ofilia Neas, PA-C  Note - This record has been created using Dragon software.  Chart creation errors have been sought, but may not always  have been located. Such creation errors do not reflect on  the standard of medical care.

## 2019-06-06 NOTE — Progress Notes (Deleted)
Office Visit Note  Patient: Shelley Morrison             Date of Birth: 1966-01-15           MRN: [id number]             PCP: Javier Docker, MD Referring: Javier Docker, MD Visit Date: 06/20/2019 Occupation: @GUAROCC @  Subjective:  No chief complaint on file.   History of Present Illness: Shelley Morrison is a 53 y.o. female ***   Activities of Daily Living:  Patient reports morning stiffness for *** {minute/hour:19697}.   Patient {ACTIONS;DENIES/REPORTS:21021675::"Denies"} nocturnal pain.  Difficulty dressing/grooming: {ACTIONS;DENIES/REPORTS:21021675::"Denies"} Difficulty climbing stairs: {ACTIONS;DENIES/REPORTS:21021675::"Denies"} Difficulty getting out of chair: {ACTIONS;DENIES/REPORTS:21021675::"Denies"} Difficulty using hands for taps, buttons, cutlery, and/or writing: {ACTIONS;DENIES/REPORTS:21021675::"Denies"}  No Rheumatology ROS completed.   PMFS History:  Patient Active Problem List   Diagnosis Date Noted  . Rheumatoid arthritis with rheumatoid factor of multiple sites without organ or systems involvement ([place]) 06/19/2017  . High risk medication use 06/18/2017  . Vitamin D deficiency 06/18/2017  . Primary insomnia 05/19/2017  . Essential hypertension 05/19/2017  . ANA positive 05/08/2017  . Rheumatoid factor positive 05/08/2017    Past Medical History:  Diagnosis Date  . Hypertension   . Lupus ([place])     Family History  Problem Relation Age of Onset  . Healthy Son   . Healthy Daughter    Past Surgical History:  Procedure Laterality Date  . CHOLECYSTECTOMY     Social History   Social History Narrative  . Not on file    There is no immunization history on file for this patient.   Objective: Vital Signs: There were no vitals taken for this visit.   Physical Exam   Musculoskeletal Exam: ***  CDAI Exam: CDAI Score: Not documented Patient Global Assessment: Not documented; Provider Global Assessment: Not documented Swollen: Not  documented; Tender: Not documented Joint Exam   Not documented   There is currently no information documented on the homunculus. Go to the Rheumatology activity and complete the homunculus joint exam.  Investigation: No additional findings.  Imaging: No results found.  Recent Labs: Lab Results  Component Value Date   WBC 4.2 01/24/2019   HGB 12.4 01/24/2019   PLT 278 01/24/2019   NA 140 01/24/2019   K 4.0 01/24/2019   CL 106 01/24/2019   CO2 28 01/24/2019   GLUCOSE 116 (H) 01/24/2019   BUN 9 01/24/2019   CREATININE 0.89 01/24/2019   BILITOT 0.3 01/24/2019   ALKPHOS 45 08/24/2017   AST 16 01/24/2019   ALT 14 01/24/2019   PROT 7.9 01/24/2019   ALBUMIN 3.7 08/24/2017   CALCIUM 9.8 01/24/2019   GFRAA 86 01/24/2019    Speciality Comments: PLQ Eye Exam: 1/ 15/20 WNL @ [facility] Follow up in 6 months.   Procedures:  No procedures performed Allergies: Patient has no known allergies.   Assessment / Plan:     Visit Diagnoses: No diagnosis found.   Orders: No orders of the defined types were placed in this encounter.  No orders of the defined types were placed in this encounter.   Face-to-face time spent with patient was *** minutes. Greater than 50% of time was spent in counseling and coordination of care.  Follow-Up Instructions: No follow-ups on file.   Earnestine Mealing, CMA  Note - This record has been created using Editor, commissioning.  Chart creation errors have been sought, but may not always  have been located.  Such creation errors do not reflect on  the standard of medical care.

## 2019-06-15 ENCOUNTER — Ambulatory Visit: Payer: Self-pay | Admitting: Physician Assistant

## 2019-06-15 ENCOUNTER — Other Ambulatory Visit: Payer: Self-pay | Admitting: Rheumatology

## 2019-06-15 DIAGNOSIS — M0579 Rheumatoid arthritis with rheumatoid factor of multiple sites without organ or systems involvement: Secondary | ICD-10-CM

## 2019-06-16 NOTE — Telephone Encounter (Signed)
Last Visit: 01/24/19 Next Visit: 07/18/19  Labs: 01/24/19 CBC WNL. Glucose is 116. Rest of CMP WNL.  PLQ Eye Exam: 1/ 15/20 WNL   Okay to refill per Dr. Estanislado Pandy

## 2019-06-20 ENCOUNTER — Ambulatory Visit: Payer: Self-pay | Admitting: Rheumatology

## 2019-07-04 NOTE — Progress Notes (Signed)
Office Visit Note  Patient: Shelley Morrison             Date of Birth: 02-22-1966           MRN: [id number]             PCP: Javier Docker, MD Referring: Javier Docker, MD Visit Date: 07/18/2019 Occupation: @GUAROCC @  Subjective:  Medication monitoring     History of Present Illness: Shelley Morrison is a 53 y.o. female with history of seropositive rheumatoid arthritis.  She takes Plaquenil 2 mg 1 tablet by mouth twice daily.  She has not missed any doses of Plaquenil recently.  She denies any recent rheumatoid arthritis flares.  She states occasional discomfort in her right hand but denies any joint swelling.  She states that intermittently she will have right shoulder discomfort.  She states that 1 month ago she had discomfort in her difficulty with active range of motion.  She states that her pain resolved in 1 to 2 days.  She has no shoulder pain at this time.  She has no joint pain or joint swelling currently.  She is not experiencing any increased morning stiffness.  She has not had any recent infections.   Activities of Daily Living:  Patient reports morning stiffness for 10 minutes.   Patient Denies nocturnal pain.  Difficulty dressing/grooming: Reports Difficulty climbing stairs: Denies Difficulty getting out of chair: Denies Difficulty using hands for taps, buttons, cutlery, and/or writing: Denies  Review of Systems  Constitutional: Positive for fatigue.  HENT: Negative for mouth sores, mouth dryness and nose dryness.   Eyes: Positive for dryness. Negative for pain and visual disturbance.  Respiratory: Negative for cough, hemoptysis, shortness of breath and difficulty breathing.   Cardiovascular: Negative for chest pain, palpitations, hypertension and swelling in legs/feet.  Gastrointestinal: Negative for blood in stool, constipation and diarrhea.  Endocrine: Negative for increased urination.  Genitourinary: Negative for difficulty urinating and painful urination.   Musculoskeletal: Positive for arthralgias, joint pain, joint swelling, muscle weakness and morning stiffness. Negative for myalgias, muscle tenderness and myalgias.  Skin: Negative for color change, pallor, rash, hair loss, nodules/bumps, skin tightness, ulcers and sensitivity to sunlight.  Allergic/Immunologic: Negative for susceptible to infections.  Neurological: Negative for dizziness, numbness, headaches and weakness.  Hematological: Negative for bruising/bleeding tendency and swollen glands.  Psychiatric/Behavioral: Negative for depressed mood and sleep disturbance. The patient is not nervous/anxious.     PMFS History:  Patient Active Problem List   Diagnosis Date Noted  . Rheumatoid arthritis with rheumatoid factor of multiple sites without organ or systems involvement ([place]) 06/19/2017  . High risk medication use 06/18/2017  . Vitamin D deficiency 06/18/2017  . Primary insomnia 05/19/2017  . Essential hypertension 05/19/2017  . ANA positive 05/08/2017  . Rheumatoid factor positive 05/08/2017    Past Medical History:  Diagnosis Date  . Hypertension   . Lupus ([place])   . Rheumatoid arthritis ([place])     Family History  Problem Relation Age of Onset  . Healthy Son   . Healthy Daughter    Past Surgical History:  Procedure Laterality Date  . CHOLECYSTECTOMY     Social History   Social History Narrative  . Not on file    There is no immunization history on file for this patient.   Objective: Vital Signs: BP 125/74 (BP Location: Left Arm, Patient Position: Sitting, Cuff Size: Normal)   Pulse 75   Resp 14   Ht 5' 10.5" (  1.791 m)   Wt 189 lb 12.8 oz (86.1 kg)   BMI 26.85 kg/m    Physical Exam Vitals signs and nursing note reviewed.  Constitutional:      Appearance: She is well-developed.  HENT:     Head: Normocephalic and atraumatic.  Eyes:     Conjunctiva/sclera: Conjunctivae normal.  Neck:     Musculoskeletal: Normal range of motion.  Cardiovascular:      Rate and Rhythm: Normal rate and regular rhythm.     Heart sounds: Normal heart sounds.  Pulmonary:     Effort: Pulmonary effort is normal.     Breath sounds: Normal breath sounds.  Abdominal:     General: Bowel sounds are normal.     Palpations: Abdomen is soft.  Lymphadenopathy:     Cervical: No cervical adenopathy.  Skin:    General: Skin is warm and dry.     Capillary Refill: Capillary refill takes less than 2 seconds.  Neurological:     Mental Status: She is alert and oriented to person, place, and time.  Psychiatric:        Behavior: Behavior normal.      Musculoskeletal Exam: C-spine, thoracic spine, lumbar spine good range of motion.  No midline spinal tenderness.  No SI joint tenderness.  Shoulder joints, elbow joints, wrist joints, MCPs, PIPs and DIPs good range of motion no synovitis.  She has synovial thickening of the right second and third MCP joints.  She has complete fist formation bilaterally.  Hip joints, knee joints, ankle joints, MCPs, PIPs and DIPs good range of motion with no synovitis.  No warmth or effusion of bilateral knee joints.  No tenderness or swelling of ankle joints.  She has tenderness over the left trochanteric bursa.  CDAI Exam: CDAI Score: 0.6  Patient Global: 3 mm; Provider Global: 3 mm Swollen: 0 ; Tender: 0  Joint Exam   No joint exam has been documented for this visit   There is currently no information documented on the homunculus. Go to the Rheumatology activity and complete the homunculus joint exam.  Investigation: No additional findings.  Imaging: No results found.  Recent Labs: Lab Results  Component Value Date   WBC 4.2 01/24/2019   HGB 12.4 01/24/2019   PLT 278 01/24/2019   NA 140 01/24/2019   K 4.0 01/24/2019   CL 106 01/24/2019   CO2 28 01/24/2019   GLUCOSE 116 (H) 01/24/2019   BUN 9 01/24/2019   CREATININE 0.89 01/24/2019   BILITOT 0.3 01/24/2019   ALKPHOS 45 08/24/2017   AST 16 01/24/2019   ALT 14 01/24/2019    PROT 7.9 01/24/2019   ALBUMIN 3.7 08/24/2017   CALCIUM 9.8 01/24/2019   GFRAA 86 01/24/2019    Speciality Comments: PLQ Eye Exam: 1/ 15/20 WNL @ [facility] Follow up in 6 months.   Procedures:  No procedures performed Allergies: Patient has no known allergies.   Assessment / Plan:     Visit Diagnoses:  1. Seropositive rheumatoid arthritis of multiple sites [facility](HCC): She has no synovitis on exam.  She has not had any recent rheumatoid arthritis flares.  She is clinically doing well on Plaquenil 200 mg 1 tablet by mouth twice daily.  She has not missed any doses of Plaquenil recently.  She has no joint pain or joint swelling at this time.  She has morning stiffness lasting about 10 minutes.  She has intermittent right shoulder joint pain.  Her last episode was 1 month  ago.  She denies any recent injuries.  She is good range of motion of the right shoulder on exam.  No tenderness or effusion was noted on exam today.  We discussed using Voltaren gel topically 4 times daily as needed for pain relief.  We also discussed avoiding overuse activities and lifting heavy objects.  She declined x-rays and a cortisone injection today.  She was advised to notify us if her right shoulder pain persists or worsens.  She will continue taking Plaquenil 200 mg 1 tablet by mouth twice daily.  She does not need any refills at this time.  She was advised to notify us if develops increased joint pain or joint swelling.  She will follow-up in the office in 5 months  2. High risk medication use: Plaquenil 200 mg 1 tablet twice daily.  Last Plaquenil eye exam normal on 01/12/2019. She was given an updated PLQ eye exam. Most recent CBC/CMP within normal limits except for elevated glucose on 01/24/2019.  Due for CBC/CMP today and will monitor every 5 months. Standing orders placed.    3. Primary insomnia: She takes trazodone as prescribed for insomnia.   4. History of vitamin D deficiency: She is taking vitamin D 2,000  units by mouth daily.  Vitamin D was 44 on 04/18/19.  5. History of hypertension: BP was 125/74.  6. Acute pain of right shoulder: She experiences intermittent right shoulder joint pain.  The last episode was 1 month ago.  She denies any recent injuries or falls.  She does not perform any overuse activities or lift heavy objects at work.  During these episodes she experiences discomfort with active range of motion.  She has full range of motion exam today.  No tenderness or effusion was noted.  We discussed avoiding carrying her purse on the right shoulder.  We discussed the importance of avoiding overuse activities and lifting heavy objects.  She can apply Voltaren gel to 4 times daily as needed for pain relief.  She declined x-rays and a cortisone injection today.  She was advised to notify us if her joint pain persists or worsens.      Orders: Orders Placed This Encounter  Procedures  . COMPLETE METABOLIC PANEL WITH GFR  . CBC with Differential/Platelet   No orders of the defined types were placed in this encounter.    Follow-Up Instructions: Return in about 5 months (around 12/18/2019) for Rheumatoid arthritis.   Gearldine Bienenstockaylor M Camauri Fleece, PA-C  Note - This record has been created using Dragon software.  Chart creation errors have been sought, but may not always  have been located. Such creation errors do not reflect on  the standard of medical care.

## 2019-07-18 ENCOUNTER — Encounter: Payer: Self-pay | Admitting: Physician Assistant

## 2019-07-18 ENCOUNTER — Ambulatory Visit: Payer: BC Managed Care – PPO | Admitting: Physician Assistant

## 2019-07-18 ENCOUNTER — Other Ambulatory Visit: Payer: Self-pay

## 2019-07-18 VITALS — BP 125/74 | HR 75 | Resp 14 | Ht 70.5 in | Wt 189.8 lb

## 2019-07-18 DIAGNOSIS — M25511 Pain in right shoulder: Secondary | ICD-10-CM

## 2019-07-18 DIAGNOSIS — M0579 Rheumatoid arthritis with rheumatoid factor of multiple sites without organ or systems involvement: Secondary | ICD-10-CM

## 2019-07-18 DIAGNOSIS — Z8639 Personal history of other endocrine, nutritional and metabolic disease: Secondary | ICD-10-CM | POA: Diagnosis not present

## 2019-07-18 DIAGNOSIS — Z79899 Other long term (current) drug therapy: Secondary | ICD-10-CM

## 2019-07-18 DIAGNOSIS — F5101 Primary insomnia: Secondary | ICD-10-CM | POA: Diagnosis not present

## 2019-07-18 DIAGNOSIS — Z8679 Personal history of other diseases of the circulatory system: Secondary | ICD-10-CM

## 2019-07-19 LAB — COMPLETE METABOLIC PANEL WITH GFR
AG Ratio: 1.2 (calc) (ref 1.0–2.5)
ALT: 17 U/L (ref 6–29)
AST: 17 U/L (ref 10–35)
Albumin: 3.9 g/dL (ref 3.6–5.1)
Alkaline phosphatase (APISO): 73 U/L (ref 37–153)
BUN: 9 mg/dL (ref 7–25)
CO2: 29 mmol/L (ref 20–32)
Calcium: 9.6 mg/dL (ref 8.6–10.4)
Chloride: 104 mmol/L (ref 98–110)
Creat: 1 mg/dL (ref 0.50–1.05)
GFR, Est African American: 75 mL/min/{1.73_m2} (ref >=60)
GFR, Est Non African American: 65 mL/min/{1.73_m2} (ref >=60)
Globulin: 3.3 g/dL (calc) (ref 1.9–3.7)
Glucose, Bld: 83 mg/dL (ref 65–99)
Potassium: 4.1 mmol/L (ref 3.5–5.3)
Sodium: 141 mmol/L (ref 135–146)
Total Bilirubin: 0.3 mg/dL (ref 0.2–1.2)
Total Protein: 7.2 g/dL (ref 6.1–8.1)

## 2019-07-19 LAB — CBC WITH DIFFERENTIAL/PLATELET
Absolute Monocytes: 535 cells/uL (ref 200–950)
Basophils Absolute: 53 cells/uL (ref 0–200)
Basophils Relative: 1 %
Eosinophils Absolute: 191 cells/uL (ref 15–500)
Eosinophils Relative: 3.6 %
HCT: 36.6 % (ref 35.0–45.0)
Hemoglobin: 12.2 g/dL (ref 11.7–15.5)
Lymphs Abs: 2443 cells/uL (ref 850–3900)
MCH: 28.9 pg (ref 27.0–33.0)
MCHC: 33.3 g/dL (ref 32.0–36.0)
MCV: 86.7 fL (ref 80.0–100.0)
MPV: 10.8 fL (ref 7.5–12.5)
Monocytes Relative: 10.1 %
Neutro Abs: 2078 cells/uL (ref 1500–7800)
Neutrophils Relative %: 39.2 %
Platelets: 269 10*3/uL (ref 140–400)
RBC: 4.22 10*6/uL (ref 3.80–5.10)
RDW: 12.5 % (ref 11.0–15.0)
Total Lymphocyte: 46.1 %
WBC: 5.3 10*3/uL (ref 3.8–10.8)

## 2019-07-19 NOTE — Progress Notes (Signed)
CBC and CMP WNL

## 2019-08-15 ENCOUNTER — Other Ambulatory Visit: Payer: Self-pay | Admitting: Rheumatology

## 2019-08-15 DIAGNOSIS — M0579 Rheumatoid arthritis with rheumatoid factor of multiple sites without organ or systems involvement: Secondary | ICD-10-CM

## 2019-08-15 NOTE — Telephone Encounter (Signed)
Last Visit: 06/2019 Next Visit: 12/20/19  Labs: 07/18/19 WNL Eye exam: 07/25/19 WNL   Okay to refill per Dr. Estanislado Pandy

## 2019-09-20 ENCOUNTER — Telehealth: Payer: Self-pay | Admitting: Cardiology

## 2019-09-20 NOTE — Telephone Encounter (Signed)
LVM for patient to call and schedule appt as a new patient.

## 2019-09-22 ENCOUNTER — Ambulatory Visit: Payer: BC Managed Care – PPO | Admitting: Cardiology

## 2019-10-18 ENCOUNTER — Ambulatory Visit: Payer: BC Managed Care – PPO | Admitting: Cardiology

## 2019-10-26 ENCOUNTER — Telehealth: Payer: Self-pay | Admitting: *Deleted

## 2019-10-26 NOTE — Telephone Encounter (Signed)
A message was left, re: her new patient appointment. 

## 2019-10-28 ENCOUNTER — Encounter: Payer: Self-pay | Admitting: General Practice

## 2019-12-02 ENCOUNTER — Telehealth: Payer: Self-pay | Admitting: Rheumatology

## 2019-12-02 NOTE — Telephone Encounter (Signed)
I spoke with patient, and gave her our new phone number. Also, I advised her she is already scheduled for an appointment on 12/20/2019. Patient was unaware of this appointment, and will keep it as scheduled.

## 2019-12-02 NOTE — Telephone Encounter (Signed)
Patient called to schedule doctor's appointment. Patient would like doctor's  appointment to be schedule between dates 01/09/2020 - 01/16/2020.

## 2019-12-16 ENCOUNTER — Other Ambulatory Visit: Payer: Self-pay | Admitting: Rheumatology

## 2019-12-16 DIAGNOSIS — M0579 Rheumatoid arthritis with rheumatoid factor of multiple sites without organ or systems involvement: Secondary | ICD-10-CM

## 2019-12-16 NOTE — Telephone Encounter (Signed)
Last Visit: 07/18/2019 Next Visit: 12/20/2019 Labs: 07/18/2019 CBC and CMP WNL Eye exam: 07/25/2019   Okay to refill per Dr. Estanislado Pandy.

## 2019-12-20 ENCOUNTER — Ambulatory Visit: Payer: BC Managed Care – PPO | Admitting: Rheumatology

## 2020-02-09 NOTE — Progress Notes (Signed)
Office Visit Note  Patient: Shelley Morrison             Date of Birth: 05/19/1966           MRN: [id number]             PCP: Gilda Crease, MD Referring: Gilda Crease, MD Visit Date: 02/15/2020 Occupation: @GUAROCC @  Subjective:  Medication management.   History of Present Illness: Shelley Morrison is a 54 y.o. female with history of rheumatoid arthritis.  She states she has occasional stiffness in her hands for which she takes ibuprofen.  She has not noticed any joint swelling.  She has been tolerating Plaquenil well without any side effects.  Activities of Daily Living:  Patient reports morning stiffness for 0 minutes.   Patient Denies nocturnal pain.  Difficulty dressing/grooming: Denies Difficulty climbing stairs: Denies Difficulty getting out of chair: Denies Difficulty using hands for taps, buttons, cutlery, and/or writing: Denies  Review of Systems  Constitutional: Negative for fatigue.  HENT: Negative for mouth sores, mouth dryness and nose dryness.   Eyes: Negative for itching and dryness.  Respiratory: Negative for shortness of breath and difficulty breathing.   Cardiovascular: Negative for chest pain and palpitations.  Gastrointestinal: Negative for blood in stool, constipation and diarrhea.  Endocrine: Negative for increased urination.  Genitourinary: Negative for difficulty urinating and painful urination.  Musculoskeletal: Negative for arthralgias, joint pain, joint swelling and morning stiffness.  Skin: Negative for rash.  Allergic/Immunologic: Negative for susceptible to infections.  Neurological: Negative for dizziness, numbness, headaches, memory loss and weakness.  Hematological: Negative for bruising/bleeding tendency.  Psychiatric/Behavioral: Negative for confusion and sleep disturbance.    PMFS History:  Patient Active Problem List   Diagnosis Date Noted  . Rheumatoid arthritis with rheumatoid factor of multiple sites without organ or systems  involvement (HCC) 06/19/2017  . High risk medication use 06/18/2017  . Vitamin D deficiency 06/18/2017  . Primary insomnia 05/19/2017  . Essential hypertension 05/19/2017  . ANA positive 05/08/2017  . Rheumatoid factor positive 05/08/2017    Past Medical History:  Diagnosis Date  . Hypertension   . Lupus (HCC)   . Rheumatoid arthritis (HCC)     Family History  Problem Relation Age of Onset  . Healthy Son   . Healthy Daughter    Past Surgical History:  Procedure Laterality Date  . CHOLECYSTECTOMY     Social History   Social History Narrative  . Not on file    There is no immunization history on file for this patient.   Objective: Vital Signs: BP (!) 155/95 (BP Location: Left Arm, Patient Position: Sitting, Cuff Size: Normal)   Pulse 78   Resp 14   Ht 5' 10.5" (1.791 m)   Wt 209 lb (94.8 kg)   BMI 29.56 kg/m    Physical Exam Vitals and nursing note reviewed.  Constitutional:      Appearance: She is well-developed.  HENT:     Head: Normocephalic and atraumatic.  Eyes:     Conjunctiva/sclera: Conjunctivae normal.  Cardiovascular:     Rate and Rhythm: Normal rate and regular rhythm.     Heart sounds: Normal heart sounds.  Pulmonary:     Effort: Pulmonary effort is normal.     Breath sounds: Normal breath sounds.  Abdominal:     General: Bowel sounds are normal.     Palpations: Abdomen is soft.  Musculoskeletal:     Cervical back: Normal range of motion.  Lymphadenopathy:  Cervical: No cervical adenopathy.  Skin:    General: Skin is warm and dry.     Capillary Refill: Capillary refill takes less than 2 seconds.  Neurological:     Mental Status: She is alert and oriented to person, place, and time.  Psychiatric:        Behavior: Behavior normal.      Musculoskeletal Exam: C-spine, thoracic and lumbar spine with good range of motion.  Shoulder joints, elbow joints, wrist joints, MCPs and PIPs with good range of motion.  She has some PIP thickening .   No synovitis was noted.  Hip joints, knee joints, ankles and MTPs PIPs been good range of motion with no synovitis.  CDAI Exam: CDAI Score: 0  Patient Global: 0 mm; Provider Global: 0 mm Swollen: 0 ; Tender: 0  Joint Exam 02/15/2020   No joint exam has been documented for this visit   There is currently no information documented on the homunculus. Go to the Rheumatology activity and complete the homunculus joint exam.  Investigation: No additional findings.  Imaging: No results found.  Recent Labs: Lab Results  Component Value Date   WBC 5.3 07/18/2019   HGB 12.2 07/18/2019   PLT 269 07/18/2019   NA 141 07/18/2019   K 4.1 07/18/2019   CL 104 07/18/2019   CO2 29 07/18/2019   GLUCOSE 83 07/18/2019   BUN 9 07/18/2019   CREATININE 1.00 07/18/2019   BILITOT 0.3 07/18/2019   ALKPHOS 45 08/24/2017   AST 17 07/18/2019   ALT 17 07/18/2019   PROT 7.2 07/18/2019   ALBUMIN 3.7 08/24/2017   CALCIUM 9.6 07/18/2019   GFRAA 75 07/18/2019    Speciality Comments: PLQ Eye Exam: 01/09/2020 WNL @ [facility] Follow up in 6 months.  Procedures:  No procedures performed Allergies: Patient has no known allergies.   Assessment / Plan:     Visit Diagnoses: Seropositive rheumatoid arthritis of multiple sites (HCC)-patient had no synovitis on examination.  She has been doing well on Plaquenil.  We will continue current treatment.  High risk medication use - Plaquenil 200 mg 1 tablet twice daily.  Eye Exam: 01/09/2020  - Plan: CBC with Differential/Platelet, COMPLETE METABOLIC PANEL WITH GFR today and then at follow-up visit.  Primary insomnia-good sleep hygiene was discussed.  History of vitamin D deficiency-she is on supplement.  History of hypertension-patient blood pressure is elevated.  Have advised her to monitor blood pressure and follow-up with her PCP.  Osteoporosis screening-patient has never had a bone density.  We will schedule DEXA.  She is  postmenopausal.  Orders: Orders Placed This Encounter  Procedures  . DG BONE DENSITY (DXA)  . CBC with Differential/Platelet  . COMPLETE METABOLIC PANEL WITH GFR   No orders of the defined types were placed in this encounter.     Follow-Up Instructions: Return in about 5 months (around 07/14/2020) for Rheumatoid arthritis.   Bo Merino, MD  Note - This record has been created using Editor, commissioning.  Chart creation errors have been sought, but may not always  have been located. Such creation errors do not reflect on  the standard of medical care.

## 2020-02-15 ENCOUNTER — Other Ambulatory Visit: Payer: Self-pay

## 2020-02-15 ENCOUNTER — Ambulatory Visit (INDEPENDENT_AMBULATORY_CARE_PROVIDER_SITE_OTHER): Payer: BC Managed Care – PPO | Admitting: Rheumatology

## 2020-02-15 ENCOUNTER — Encounter: Payer: Self-pay | Admitting: Rheumatology

## 2020-02-15 VITALS — BP 155/95 | HR 78 | Resp 14 | Ht 70.5 in | Wt 209.0 lb

## 2020-02-15 DIAGNOSIS — M0579 Rheumatoid arthritis with rheumatoid factor of multiple sites without organ or systems involvement: Secondary | ICD-10-CM

## 2020-02-15 DIAGNOSIS — Z780 Asymptomatic menopausal state: Secondary | ICD-10-CM

## 2020-02-15 DIAGNOSIS — Z8679 Personal history of other diseases of the circulatory system: Secondary | ICD-10-CM

## 2020-02-15 DIAGNOSIS — Z8639 Personal history of other endocrine, nutritional and metabolic disease: Secondary | ICD-10-CM

## 2020-02-15 DIAGNOSIS — Z79899 Other long term (current) drug therapy: Secondary | ICD-10-CM

## 2020-02-15 DIAGNOSIS — F5101 Primary insomnia: Secondary | ICD-10-CM

## 2020-02-15 DIAGNOSIS — Z13820 Encounter for screening for osteoporosis: Secondary | ICD-10-CM

## 2020-02-16 LAB — CBC WITH DIFFERENTIAL/PLATELET
Absolute Monocytes: 754 cells/uL (ref 200–950)
Basophils Absolute: 50 cells/uL (ref 0–200)
Basophils Relative: 0.9 %
Eosinophils Absolute: 143 cells/uL (ref 15–500)
Eosinophils Relative: 2.6 %
HCT: 39.1 % (ref 35.0–45.0)
Hemoglobin: 12.7 g/dL (ref 11.7–15.5)
Lymphs Abs: 2068 cells/uL (ref 850–3900)
MCH: 28 pg (ref 27.0–33.0)
MCHC: 32.5 g/dL (ref 32.0–36.0)
MCV: 86.1 fL (ref 80.0–100.0)
MPV: 11.1 fL (ref 7.5–12.5)
Monocytes Relative: 13.7 %
Neutro Abs: 2486 cells/uL (ref 1500–7800)
Neutrophils Relative %: 45.2 %
Platelets: 250 10*3/uL (ref 140–400)
RBC: 4.54 10*6/uL (ref 3.80–5.10)
RDW: 12.7 % (ref 11.0–15.0)
Total Lymphocyte: 37.6 %
WBC: 5.5 10*3/uL (ref 3.8–10.8)

## 2020-02-16 LAB — COMPLETE METABOLIC PANEL WITH GFR
AG Ratio: 1.2 (calc) (ref 1.0–2.5)
ALT: 27 U/L (ref 6–29)
AST: 21 U/L (ref 10–35)
Albumin: 4 g/dL (ref 3.6–5.1)
Alkaline phosphatase (APISO): 80 U/L (ref 37–153)
BUN: 12 mg/dL (ref 7–25)
CO2: 24 mmol/L (ref 20–32)
Calcium: 9.4 mg/dL (ref 8.6–10.4)
Chloride: 108 mmol/L (ref 98–110)
Creat: 1.04 mg/dL (ref 0.50–1.05)
GFR, Est African American: 71 mL/min/{1.73_m2} (ref >=60)
GFR, Est Non African American: 61 mL/min/{1.73_m2} (ref >=60)
Globulin: 3.3 g/dL (calc) (ref 1.9–3.7)
Glucose, Bld: 85 mg/dL (ref 65–99)
Potassium: 4.5 mmol/L (ref 3.5–5.3)
Sodium: 140 mmol/L (ref 135–146)
Total Bilirubin: 0.2 mg/dL (ref 0.2–1.2)
Total Protein: 7.3 g/dL (ref 6.1–8.1)

## 2020-02-17 NOTE — Progress Notes (Signed)
CBC and CMP are normal.

## 2020-03-14 ENCOUNTER — Other Ambulatory Visit: Payer: Self-pay | Admitting: Rheumatology

## 2020-03-14 DIAGNOSIS — M0579 Rheumatoid arthritis with rheumatoid factor of multiple sites without organ or systems involvement: Secondary | ICD-10-CM

## 2020-03-14 NOTE — Telephone Encounter (Signed)
Last visit: 02/15/20 Next Visit: 07/12/20 Labs: 02/15/20 WNL  PLQ Eye Exam: 01/09/2020 WNL   Okay to refill per Dr. Corliss Skains

## 2020-06-18 ENCOUNTER — Other Ambulatory Visit: Payer: Self-pay | Admitting: Rheumatology

## 2020-06-18 DIAGNOSIS — M0579 Rheumatoid arthritis with rheumatoid factor of multiple sites without organ or systems involvement: Secondary | ICD-10-CM

## 2020-06-18 NOTE — Telephone Encounter (Signed)
Last visit: 02/15/20 Next Visit: 07/12/20 Labs: 02/15/20 WNL  PLQ Eye Exam: 01/09/2020 WNL   Current Dose per office note on 02/15/2020: Plaquenil 200 mg 1 tablet twice daily  Okay to refill per Dr. Corliss Skains

## 2020-07-03 NOTE — Progress Notes (Deleted)
Office Visit Note  Patient: Shelley Morrison             Date of Birth: 10-11-1966           MRN: [id number]             PCP: Gilda Crease, MD Referring: Gilda Crease, MD Visit Date: 07/12/2020 Occupation: @GUAROCC @  Subjective:  No chief complaint on file.   History of Present Illness: Shelley Morrison is a 54 y.o. female ***   Activities of Daily Living:  Patient reports morning stiffness for *** {minute/hour:19697}.   Patient {ACTIONS;DENIES/REPORTS:21021675::"Denies"} nocturnal pain.  Difficulty dressing/grooming: {ACTIONS;DENIES/REPORTS:21021675::"Denies"} Difficulty climbing stairs: {ACTIONS;DENIES/REPORTS:21021675::"Denies"} Difficulty getting out of chair: {ACTIONS;DENIES/REPORTS:21021675::"Denies"} Difficulty using hands for taps, buttons, cutlery, and/or writing: {ACTIONS;DENIES/REPORTS:21021675::"Denies"}  No Rheumatology ROS completed.   PMFS History:  Patient Active Problem List   Diagnosis Date Noted  . 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 High risk medication use 06/18/2017  . Vitamin D deficiency 06/18/2017  . Primary insomnia 05/19/2017  . Essential hypertension 05/19/2017  . ANA positive 05/08/2017  . Rheumatoid factor positive 05/08/2017    Past Medical History:  Diagnosis Date  . Hypertension   . Lupus (HCC)   . Rheumatoid arthritis (HCC)     Family History  Problem Relation Age of Onset  . Healthy Son   . Healthy Daughter    Past Surgical History:  Procedure Laterality Date  . CHOLECYSTECTOMY     Social History   Social History Narrative  . Not on file    There is no immunization history on file for this patient.   Objective: Vital Signs: There were no vitals taken for this visit.   Physical Exam   Musculoskeletal Exam: ***  CDAI Exam: CDAI Score: -- Patient Global: --; Provider Global: -- Swollen: --; Tender: -- Joint Exam 07/12/2020    No joint exam has been documented for this visit   There is currently no information documented on the homunculus. Go to the Rheumatology activity and complete the homunculus joint exam.  Investigation: No additional findings.  Imaging: No results found.  Recent Labs: Lab Results  Component Value Date   WBC 5.5 02/15/2020   HGB 12.7 02/15/2020   PLT 250 02/15/2020   NA 140 02/15/2020   K 4.5 02/15/2020   CL 108 02/15/2020   CO2 24 02/15/2020   GLUCOSE 85 02/15/2020   BUN 12 02/15/2020   CREATININE 1.04 02/15/2020   BILITOT 0.2 02/15/2020   ALKPHOS 45 08/24/2017   AST 21 02/15/2020   ALT 27 02/15/2020   PROT 7.3 02/15/2020   ALBUMIN 3.7 08/24/2017   CALCIUM 9.4 02/15/2020   GFRAA 71 02/15/2020    Speciality Comments: PLQ Eye Exam: 01/09/2020 WNL @ [facility] Follow up in 6 months.  Procedures:  No procedures performed Allergies: Patient has no known allergies.   Assessment / Plan:     Visit Diagnoses: No diagnosis found.  Orders: No orders of the defined types were placed in this encounter.  No orders of the defined types were placed in this encounter.   Face-to-face time spent with patient was *** minutes. Greater than 50% of time was spent in counseling and coordination of care.  Follow-Up Instructions: No follow-ups on file.   03/08/2020, CMA  Note - This record has been created using Ellen Henri.  Chart creation errors have been sought, but may not always  have been located. Such creation errors do not reflect on  the standard of medical care.

## 2020-07-12 ENCOUNTER — Ambulatory Visit: Payer: BC Managed Care – PPO | Admitting: Rheumatology

## 2020-08-01 NOTE — Progress Notes (Signed)
Office Visit Note  Patient: Shelley Morrison             Date of Birth: [birth year]           MRN: [id number]             PCP: Gilda Crease, MD Referring: Gilda Crease, MD Visit Date: 08/06/2020 Occupation: @GUAROCC @  Subjective:  Lower back pain   History of Present Illness: Shelley Morrison is a 54 y.o. female with history of seropositive rheumatoid arthritis.  She is taking plaquenil 200 mg 1 tablet by mouth twice daily. She is tolerating PLQ without any side effects.  She denies any recent infections.  She has not received the covid-19 vaccination but is considering it in the future.  She has been having increased pain and stiffness in both hands.  She has noticed intermittent joint swelling.  She states for the past 1 week she has been experiencing left sided lower back pain and anterior thigh numbness. She takes flexeril as needed for muscle spasms. She is also having tenderness over the left trochanteric bursa. She has been taking trazodone at bedtime for insomnia.    Activities of Daily Living:  Patient reports morning stiffness for 10 minutes.   Patient Denies nocturnal pain.  Difficulty dressing/grooming: Denies Difficulty climbing stairs: Reports Difficulty getting out of chair: Reports Difficulty using hands for taps, buttons, cutlery, and/or writing: Denies  Review of Systems  Constitutional: Positive for fatigue.  HENT: Negative for mouth sores, mouth dryness and nose dryness.   Eyes: Positive for dryness. Negative for pain and visual disturbance.  Respiratory: Negative for cough, hemoptysis, shortness of breath and difficulty breathing.   Cardiovascular: Negative for chest pain, palpitations, hypertension and swelling in legs/feet.  Gastrointestinal: Negative for blood in stool, constipation and diarrhea.  Endocrine: Negative for excessive thirst and increased urination.  Genitourinary: Negative for difficulty urinating and painful urination.  Musculoskeletal:  Positive for arthralgias, joint pain, joint swelling, muscle weakness, morning stiffness and muscle tenderness. Negative for myalgias and myalgias.  Skin: Negative for color change, pallor, rash, hair loss, nodules/bumps, skin tightness, ulcers and sensitivity to sunlight.  Allergic/Immunologic: Negative for susceptible to infections.  Neurological: Negative for dizziness, numbness, headaches and weakness.  Hematological: Negative for bruising/bleeding tendency and swollen glands.  Psychiatric/Behavioral: Positive for sleep disturbance. Negative for depressed mood. The patient is not nervous/anxious.     PMFS History:  Patient Active Problem List   Diagnosis Date Noted  . 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 High risk medication use 06/18/2017  . Vitamin D deficiency 06/18/2017  . Primary insomnia 05/19/2017  . Essential hypertension 05/19/2017  . ANA positive 05/08/2017  . Rheumatoid factor positive 05/08/2017    Past Medical History:  Diagnosis Date  . Hypertension   . Lupus (HCC)   . Rheumatoid arthritis (HCC)     Family History  Problem Relation Age of Onset  . Healthy Son   . Healthy Daughter    Past Surgical History:  Procedure Laterality Date  . CHOLECYSTECTOMY     Social History   Social History Narrative  . Not on file    There is no immunization history on file for this patient.   Objective: Vital Signs: BP 127/69 (BP Location: Left Arm, Patient Position: Sitting, Cuff Size: Normal)   Pulse 77   Resp 14   Ht 5\' 10"  (1.778 m)   Wt 204 lb 12.8  oz (92.9 kg)   BMI 29.39 kg/m    Physical Exam Vitals and nursing note reviewed.  Constitutional:      Appearance: She is well-developed.  HENT:     Head: Normocephalic and atraumatic.  Eyes:     Conjunctiva/sclera: Conjunctivae normal.  Pulmonary:     Effort: Pulmonary effort is normal.  Abdominal:     General: Bowel sounds  are normal.     Palpations: Abdomen is soft.  Musculoskeletal:     Cervical back: Normal range of motion.  Lymphadenopathy:     Cervical: No cervical adenopathy.  Skin:    General: Skin is warm and dry.     Capillary Refill: Capillary refill takes less than 2 seconds.  Neurological:     Mental Status: She is alert and oriented to person, place, and time.  Psychiatric:        Behavior: Behavior normal.      Musculoskeletal Exam: C-spine, thoracic spine, and lumbar spine good ROM.  Shoulder joints, elbow joints, wrist joints, MCPs, PIPs, and DIPs good ROM with no synovitis.  Right 2nd, 3rd, and 5th MCP joint synovial thickening.  PIP and DIP thickening consistent with osteoarthritis of both hands. Painful ROM of the left hip joint. Knee joints good ROM with no warmth or effusion.  Ankle joints good ROM with no tenderness or inflammation.    CDAI Exam: CDAI Score: 1.1  Patient Global: 8 mm; Provider Global: 3 mm Swollen: 0 ; Tender: 1  Joint Exam 08/06/2020      Right  Left  Hip      Tender     Investigation: No additional findings.  Imaging: No results found.  Recent Labs: Lab Results  Component Value Date   WBC 5.5 02/15/2020   HGB 12.7 02/15/2020   PLT 250 02/15/2020   NA 140 02/15/2020   K 4.5 02/15/2020   CL 108 02/15/2020   CO2 24 02/15/2020   GLUCOSE 85 02/15/2020   BUN 12 02/15/2020   CREATININE 1.04 02/15/2020   BILITOT 0.2 02/15/2020   ALKPHOS 45 08/24/2017   AST 21 02/15/2020   ALT 27 02/15/2020   PROT 7.3 02/15/2020   ALBUMIN 3.7 08/24/2017   CALCIUM 9.4 02/15/2020   GFRAA 71 02/15/2020    Speciality Comments: PLQ Eye Exam: 01/09/2020 WNL @ [facility] Follow up in 6 months.  Procedures:  No procedures performed Allergies: Patient has no known allergies.     Assessment / Plan:     Visit Diagnoses: Seropositive rheumatoid arthritis of multiple sites [facility]) -She has no synovitis on exam today.  She presents today with left hip joint  pain which started 1 week ago.  She has painful range of motion on examination today with radiating pain to her groin.  X-rays of the left hip were obtained today.  She has been experiencing increased pain, stiffness, and intermittent inflammation in her hands and wrist joints.  She is currently taking Plaquenil 200 mg 1 tablet by mouth daily and is tolerating it without any side effects.  Different treatment options were discussed.  Indications, contraindications, and potential side effects of methotrexate were discussed.  She will start taking methotrexate 6 tablets by mouth once weekly for 2 weeks and if labs are stable at that time she will increase to 8 tablets by mouth once weekly.  She will take folic acid 2 mg by mouth daily.  Prescription for methotrexate and folic acid pending lab results.  She will start taking prednisone  20 mg tapering by 5 mg every 4 days.  She was advised to notify us if she cannot tolerate taking methotrexate.  We will check a sed rate today.    She will continue taking Plaquenil as prescribed.  She was advised to notify us if her joint pain and inflammation persists or worsens.  She will follow-up in the office in 6 weeks.  Plan: Sedimentation rate  Drug Counseling TB Gold: Negative 05/19/17 Hepatitis panel: Negative on 05/19/17  Chest-xray:  No active cardiopulmonary disease on CXR from 05/27/17   Contraception: Discussed   Alcohol use: Discussed   Patient was counseled on the purpose, proper use, and adverse effects of methotrexate including nausea, infection, and signs and symptoms of pneumonitis.  Reviewed instructions with patient to take methotrexate weekly along with folic acid daily.  Discussed the importance of frequent monitoring of kidney and liver function and blood counts, and provided patient with standing lab instructions.  Counseled patient to avoid NSAIDs and alcohol while on methotrexate.  Provided patient with educational materials on methotrexate and  answered all questions.  Advised patient to get annual influenza vaccine and to get a pneumococcal vaccine if patient has not already had one.  Patient voiced understanding.  Patient consented to methotrexate use.  Will upload into chart.    High risk medication use -She will be adding on methotrexate 6 tablets by mouth once weekly for 2 weeks and if labs are stable at that time she will increase to methotrexate 8 tablets by mouth once weekly.  She will take folic acid 2 mg by mouth daily.  Prescription for methotrexate pending lab results.  Plaquenil 200 mg 1 tablet twice daily.  Eye Exam: 01/09/2020.  CBC and CMP were within normal limits on 02/15/2020.  She is due to update lab work today so orders for CBC and CMP were released.  She will require updated lab work in 2 weeks x 2 then 2 months then every 3 months while on methotrexate.  Standing orders for CBC and CMP will be placed today.- Plan: COMPLETE METABOLIC PANEL WITH GFR, CBC with Differential/Platelet  Pain in left hip - She presents today with increased pain in the left hip joint.  She has been experiencing increased discomfort for the past 1 week.  She has also noticed some anterior thigh numbness.  She has painful range of motion of the left hip joint on exam.  X-rays of the left hip were obtained today.  We will send in a prednisone taper starting at 20 mg tapering by 5 mg every 4 days.  Plan: XR HIP UNILAT W OR W/O PELVIS 2-3 VIEWS LEFT.  X-ray showed mild osteoarthritic changes in bilateral hip joints.  Primary insomnia: She takes trazodone as needed for insomnia.  History of hypertension: Blood pressure was 127/69 today in the office.  History of vitamin D deficiency: She takes vitamin D 2000 units daily.  Osteoporosis screening -Future per DEXA remains in place.  She continues to take vitamin D 2000 units daily.  COVID-19-patient has not been vaccinated yet.  We detailed discussion regarding the benefits of getting COVID-19  vaccination.  She is advised to get vaccination as soon as possible before she can start methotrexate.  Use of mask, social distancing and hand hygiene was also emphasized.  Orders: Orders Placed This Encounter  Procedures  . XR HIP UNILAT W OR W/O PELVIS 2-3 VIEWS LEFT  . COMPLETE METABOLIC PANEL WITH GFR  . CBC with Differential/Platelet  . Sedimentation  rate  . CBC with Differential/Platelet  . COMPLETE METABOLIC PANEL WITH GFR   Meds ordered this encounter  Medications  . predniSONE (DELTASONE) 5 MG tablet    Sig: Take 4 tablets by mouth daily x4 days, 3 tablets by mouth daily x4 days, 2 tablets by mouth daily x4 days, 1 tablet by mouth daily x4 days.    Dispense:  40 tablet    Refill:  0    Face-to-face time spent with patient was 30 minutes. Greater than 50% of time was spent in counseling and coordination of care.  Follow-Up Instructions: Return in about 6 weeks (around 09/17/2020) for Rheumatoid arthritis.   Gearldine Bienenstock, PA-C   I examined and evaluated the patient with Sherron Ales PA.  Patient was having a flare today with increased pain and discomfort in multiple joints.  She was also having painful range of motion of her left hip joint.  The x-ray showed mild osteoarthritic changes.  I believe the symptoms are coming from a flare of rheumatoid arthritis.  A prednisone taper was called in.  We will also start her on methotrexate once her labs are available.  The plan of care was discussed as noted above.  Pollyann Savoy, MD  Note - This record has been created using Animal nutritionist.  Chart creation errors have been sought, but may not always  have been located. Such creation errors do not reflect on  the standard of medical care.

## 2020-08-06 ENCOUNTER — Other Ambulatory Visit: Payer: Self-pay

## 2020-08-06 ENCOUNTER — Ambulatory Visit: Payer: BC Managed Care – PPO | Admitting: Rheumatology

## 2020-08-06 ENCOUNTER — Ambulatory Visit: Payer: Self-pay

## 2020-08-06 ENCOUNTER — Encounter: Payer: Self-pay | Admitting: Rheumatology

## 2020-08-06 VITALS — BP 127/69 | HR 77 | Resp 14 | Ht 70.0 in | Wt 204.8 lb

## 2020-08-06 DIAGNOSIS — Z8679 Personal history of other diseases of the circulatory system: Secondary | ICD-10-CM

## 2020-08-06 DIAGNOSIS — M0579 Rheumatoid arthritis with rheumatoid factor of multiple sites without organ or systems involvement: Secondary | ICD-10-CM | POA: Diagnosis not present

## 2020-08-06 DIAGNOSIS — M25552 Pain in left hip: Secondary | ICD-10-CM | POA: Diagnosis not present

## 2020-08-06 DIAGNOSIS — F5101 Primary insomnia: Secondary | ICD-10-CM

## 2020-08-06 DIAGNOSIS — Z79899 Other long term (current) drug therapy: Secondary | ICD-10-CM

## 2020-08-06 DIAGNOSIS — Z8639 Personal history of other endocrine, nutritional and metabolic disease: Secondary | ICD-10-CM

## 2020-08-06 DIAGNOSIS — Z13820 Encounter for screening for osteoporosis: Secondary | ICD-10-CM

## 2020-08-06 LAB — CBC WITH DIFFERENTIAL/PLATELET
Absolute Monocytes: 632 cells/uL (ref 200–950)
Basophils Absolute: 41 cells/uL (ref 0–200)
Basophils Relative: 0.7 %
Eosinophils Absolute: 133 cells/uL (ref 15–500)
Eosinophils Relative: 2.3 %
HCT: 41 % (ref 35.0–45.0)
Hemoglobin: 13.7 g/dL (ref 11.7–15.5)
Lymphs Abs: 2471 cells/uL (ref 850–3900)
MCH: 29 pg (ref 27.0–33.0)
MCHC: 33.4 g/dL (ref 32.0–36.0)
MCV: 86.7 fL (ref 80.0–100.0)
MPV: 10.4 fL (ref 7.5–12.5)
Monocytes Relative: 10.9 %
Neutro Abs: 2523 cells/uL (ref 1500–7800)
Neutrophils Relative %: 43.5 %
Platelets: 288 10*3/uL (ref 140–400)
RBC: 4.73 10*6/uL (ref 3.80–5.10)
RDW: 12.7 % (ref 11.0–15.0)
Total Lymphocyte: 42.6 %
WBC: 5.8 10*3/uL (ref 3.8–10.8)

## 2020-08-06 LAB — COMPLETE METABOLIC PANEL WITH GFR
AG Ratio: 1.3 (calc) (ref 1.0–2.5)
ALT: 31 U/L — ABNORMAL HIGH (ref 6–29)
AST: 22 U/L (ref 10–35)
Albumin: 4.2 g/dL (ref 3.6–5.1)
Alkaline phosphatase (APISO): 80 U/L (ref 37–153)
BUN: 9 mg/dL (ref 7–25)
CO2: 26 mmol/L (ref 20–32)
Calcium: 10.1 mg/dL (ref 8.6–10.4)
Chloride: 103 mmol/L (ref 98–110)
Creat: 0.89 mg/dL (ref 0.50–1.05)
GFR, Est African American: 86 mL/min/{1.73_m2} (ref >=60)
GFR, Est Non African American: 74 mL/min/{1.73_m2} (ref >=60)
Globulin: 3.3 g/dL (calc) (ref 1.9–3.7)
Glucose, Bld: 85 mg/dL (ref 65–99)
Potassium: 4.7 mmol/L (ref 3.5–5.3)
Sodium: 138 mmol/L (ref 135–146)
Total Bilirubin: 0.3 mg/dL (ref 0.2–1.2)
Total Protein: 7.5 g/dL (ref 6.1–8.1)

## 2020-08-06 LAB — SEDIMENTATION RATE: Sed Rate: 17 mm/h (ref 0–30)

## 2020-08-06 MED ORDER — PREDNISONE 5 MG PO TABS: ORAL_TABLET | ORAL | 0 refills | Status: DC

## 2020-08-06 NOTE — Patient Instructions (Addendum)
Standing Labs We placed an order today for your standing lab work.   Please have your standing labs drawn in 2 weeks x2, 2 months, then every 3 months   If possible, please have your labs drawn 2 weeks prior to your appointment so that the provider can discuss your results at your appointment.  We have open lab daily Monday through Thursday from 8:30-12:30 PM and 1:30-4:30 PM and Friday from 8:30-12:30 PM and 1:30-4:00 PM at the office of Dr. Bo Merino, [facility].   Please be advised, patients with office appointments requiring lab work will take precedents over walk-in lab work.  If possible, please come for your lab work on Monday and Friday afternoons, as you may experience shorter wait times. The office is located at [address], [place], [place],  [address] No appointment is necessary.   Labs are drawn by [facility]. Please bring your co-pay at the time of your lab draw.  You may receive a bill from [place] for your lab work.  If you wish to have your labs drawn at another location, please call the office 24 hours in advance to send orders.  If you have any questions regarding directions or hours of operation,  please call [phone].   As a reminder, please drink plenty of water prior to coming for your lab work. Thanks!     Methotrexate tablets What is this medicine? METHOTREXATE (METH oh TREX ate) is a chemotherapy drug used to treat cancer including breast cancer, leukemia, and lymphoma. This medicine can also be used to treat psoriasis and certain kinds of arthritis. This medicine may be used for other purposes; ask your health care provider or pharmacist if you have questions. COMMON BRAND NAME(S): Rheumatrex, Trexall What should I tell my health care provider before I take this medicine? They need to know if you have any of these conditions:  fluid in the stomach area or lungs  if you often drink alcohol  infection or immune system  problems  kidney disease or on hemodialysis  liver disease  low blood counts, like low Seeney cell, platelet, or red cell counts  lung disease  radiation therapy  stomach ulcers  ulcerative colitis  an unusual or allergic reaction to methotrexate, other medicines, foods, dyes, or preservatives  pregnant or trying to get pregnant  breast-feeding How should I use this medicine? Take this medicine by mouth with a glass of water. Follow the directions on the prescription label. Take your medicine at regular intervals. Do not take it more often than directed. Do not stop taking except on your doctor's advice. Make sure you know why you are taking this medicine and how often you should take it. If this medicine is used for a condition that is not cancer, like arthritis or psoriasis, it should be taken weekly, NOT daily. Taking this medicine more often than directed can cause serious side effects, even death. Talk to your healthcare provider about safe handling and disposal of this medicine. You may need to take special precautions. Talk to your pediatrician regarding the use of this medicine in children. While this drug may be prescribed for selected conditions, precautions do apply. Overdosage: If you think you have taken too much of this medicine contact a poison control center or emergency room at once. NOTE: This medicine is only for you. Do not share this medicine with others. What if I miss a dose? If you miss a dose, talk with your doctor or health care  professional. Do not take double or extra doses. What may interact with this medicine? This medicine may interact with the following medication:  acitretin  aspirin and aspirin-like medicines including salicylates  azathioprine  certain antibiotics like penicillins, tetracycline, and chloramphenicol  cyclosporine  gold  hydroxychloroquine  live virus vaccines  NSAIDs, medicines for pain and inflammation, like ibuprofen  or naproxen  other cytotoxic agents  penicillamine  phenylbutazone  phenytoin  probenecid  retinoids such as isotretinoin and tretinoin  steroid medicines like prednisone or cortisone  sulfonamides like sulfasalazine and trimethoprim/sulfamethoxazole  theophylline This list may not describe all possible interactions. Give your health care provider a list of all the medicines, herbs, non-prescription drugs, or dietary supplements you use. Also tell them if you smoke, drink alcohol, or use illegal drugs. Some items may interact with your medicine. What should I watch for while using this medicine? Avoid alcoholic drinks. This medicine can make you more sensitive to the sun. Keep out of the sun. If you cannot avoid being in the sun, wear protective clothing and use sunscreen. Do not use sun lamps or tanning beds/booths. You may need blood work done while you are taking this medicine. Call your doctor or health care professional for advice if you get a fever, chills or sore throat, or other symptoms of a cold or flu. Do not treat yourself. This drug decreases your body's ability to fight infections. Try to avoid being around people who are sick. This medicine may increase your risk to bruise or bleed. Call your doctor or health care professional if you notice any unusual bleeding. Check with your doctor or health care professional if you get an attack of severe diarrhea, nausea and vomiting, or if you sweat a lot. The loss of too much body fluid can make it dangerous for you to take this medicine. Talk to your doctor about your risk of cancer. You may be more at risk for certain types of cancers if you take this medicine. Both men and women must use effective birth control with this medicine. Do not become pregnant while taking this medicine or until at least 1 normal menstrual cycle has occurred after stopping it. Women should inform their doctor if they wish to become pregnant or think they  might be pregnant. Men should not father a child while taking this medicine and for 3 months after stopping it. There is a potential for serious side effects to an unborn child. Talk to your health care professional or pharmacist for more information. Do not breast-feed an infant while taking this medicine. What side effects may I notice from receiving this medicine? Side effects that you should report to your doctor or health care professional as soon as possible:  allergic reactions like skin rash, itching or hives, swelling of the face, lips, or tongue  breathing problems or shortness of breath  diarrhea  dry, nonproductive cough  low blood counts - this medicine may decrease the number of Enyeart blood cells, red blood cells and platelets. You may be at increased risk for infections and bleeding.  mouth sores  redness, blistering, peeling or loosening of the skin, including inside the mouth  signs of infection - fever or chills, cough, sore throat, pain or trouble passing urine  signs and symptoms of bleeding such as bloody or black, tarry stools; red or dark-brown urine; spitting up blood or brown material that looks like coffee grounds; red spots on the skin; unusual bruising or bleeding from the eye,  gums, or nose  signs and symptoms of kidney injury like trouble passing urine or change in the amount of urine  signs and symptoms of liver injury like dark yellow or brown urine; general ill feeling or flu-like symptoms; light-colored stools; loss of appetite; nausea; right upper belly pain; unusually weak or tired; yellowing of the eyes or skin Side effects that usually do not require medical attention (report to your doctor or health care professional if they continue or are bothersome):  dizziness  hair loss  tiredness  upset stomach  vomiting This list may not describe all possible side effects. Call your doctor for medical advice about side effects. You may report side  effects to FDA at 1-800-FDA-1088. Where should I keep my medicine? Keep out of the reach of children. Store at room temperature between 20 and 25 degrees C (68 and 77 degrees F). Protect from light. Throw away any unused medicine after the expiration date. NOTE: This sheet is a summary. It may not cover all possible information. If you have questions about this medicine, talk to your doctor, pharmacist, or health care provider.  2020 Elsevier/Gold Standard (2017-08-06 13:38:43)  COVID-19 vaccine recommendations:   COVID-19 vaccine is recommended for everyone (unless you are allergic to a vaccine component), even if you are on a medication that suppresses your immune system.   If you are on Methotrexate, Cellcept (mycophenolate), Rinvoq, Harriette Ohara, and Olumiant- hold the medication for 1 week after each vaccine. Hold Methotrexate for 2 weeks after the single dose COVID-19 vaccine.   If you are on Orencia subcutaneous injection - hold medication one week prior to and one week after the first COVID-19 vaccine dose (only).   If you are on Orencia IV infusions- time vaccination administration so that the first COVID-19 vaccination will occur four weeks after the infusion and postpone the subsequent infusion by one week.   If you are on Cyclophosphamide or Rituxan infusions please contact your doctor prior to receiving the COVID-19 vaccine.   Do not take Tylenol or ant anti-inflammatory medications (NSAIDs) 24 hours prior to the COVID-19 vaccination.   There is no direct evidence about the efficacy of the COVID-19 vaccine in individuals who are on medications that suppress the immune system.   Even if you are fully vaccinated, and you are on any medications that suppress your immune system, please continue to wear a mask, maintain at least six feet social distance and practice hand hygiene.   If you develop a COVID-19 infection, please contact your PCP or our office to determine if you need  antibody infusion.  We anticipate that a booster vaccine will be available soon for immunosuppressed individuals. Please cal our office before receiving your booster dose to make adjustments to your medication regimen.

## 2020-08-06 NOTE — Telephone Encounter (Signed)
Pending lab results, patient will be starting methotrexate and folic acid per Sherron Ales, PA-C. Thanks!

## 2020-08-07 MED ORDER — METHOTREXATE 2.5 MG PO TABS: 15.0000 mg | ORAL_TABLET | ORAL | 0 refills | Status: DC

## 2020-08-07 MED ORDER — FOLIC ACID 1 MG PO TABS
1.0000 mg | ORAL_TABLET | Freq: Every day | ORAL | 3 refills | Status: DC
Start: 2020-08-07 — End: 2021-03-15

## 2020-08-07 NOTE — Telephone Encounter (Signed)
Patient states she is on Asprin 81 mg, Zinc and fish oil. Patient would like to know due to her elevated LFTs if she needs to stop any of these.

## 2020-08-07 NOTE — Progress Notes (Signed)
LFTs are mildly elevated.  Please forward labs to her PCP.

## 2020-08-07 NOTE — Telephone Encounter (Signed)
Oil can cause elevated LFTs.  She may stop fish oil.  She is to avoid all NSAIDs and alcohol use.

## 2020-08-07 NOTE — Telephone Encounter (Signed)
Okay to start her on methotrexate 6 tablets p.o. weekly along with folic acid 1 tablet p.o. daily.  She will stay on 6 tablets p.o. weekly for now.  Please check labs in 2 weeks and then in 2 months.

## 2020-08-07 NOTE — Telephone Encounter (Signed)
Labs resulted: LFTs are mildly elevated.   Per office note on 08/06/2020: She will start taking methotrexate 6 tablets by mouth once weekly for 2 weeks and if labs are stable at that time she will increase to 8 tablets by mouth once weekly.  She will take folic acid 2 mg by mouth daily.

## 2020-08-08 NOTE — Telephone Encounter (Signed)
Left message for patient to advise Fish oil can cause elevated LFTs.  She may stop fish oil.  She is to avoid all NSAIDs and alcohol use.

## 2020-08-08 NOTE — Telephone Encounter (Signed)
Attempted to contact the patient and left message for patient to call the office.  

## 2020-09-13 ENCOUNTER — Telehealth: Payer: Self-pay | Admitting: Rheumatology

## 2020-09-13 NOTE — Telephone Encounter (Signed)
Patient left a voicemail stating she had an appointment about a month ago with Dr. Corliss Skains.  Patient states her place of employment is requesting FML form to be filled out.  Patient states she doesn't plan to use it, but has called out of work a few days due to her Lupus flair-up.  Patient states her employer wants to protect her job so they are asking for Dr. Corliss Skains to fill out the paperwork.    Patient states she also had both COVID vaccines.

## 2020-09-13 NOTE — Telephone Encounter (Signed)
LMOM for patient to fax FMLA paperwork to office for review.

## 2020-09-19 ENCOUNTER — Telehealth: Payer: Self-pay | Admitting: *Deleted

## 2020-09-19 NOTE — Telephone Encounter (Signed)
Advised patient FMLA paperwork has been faxed and is ready for pick-up.

## 2020-10-08 ENCOUNTER — Other Ambulatory Visit: Payer: Self-pay | Admitting: Rheumatology

## 2020-10-08 DIAGNOSIS — M0579 Rheumatoid arthritis with rheumatoid factor of multiple sites without organ or systems involvement: Secondary | ICD-10-CM

## 2020-10-09 NOTE — Telephone Encounter (Signed)
Last Visit: 08/06/2020 Next Visit: attempted to contact patient and left message on machine.  Labs: 08/06/2020 LFTs are mildly elevated. Eye exam: 01/09/2020   Current Dose per office note on 08/06/2020:  methotrexate 6 tablets by mouth once weekly for 2 weeks and if labs are stable at that time she will increase to methotrexate 8 tablets by mouth once weekly.  Plaquenil 200 mg 1 tablet twice daily. JZ:[id number] rheumatoid arthritis of multiple sites   I attempted to contact patient and left message on machine to call the office to schedule a follow up and clarify if she has been taking methotrexate. Also advised patient she is due to update labs.

## 2020-10-10 ENCOUNTER — Other Ambulatory Visit: Payer: Self-pay | Admitting: Rheumatology

## 2020-10-10 DIAGNOSIS — M0579 Rheumatoid arthritis with rheumatoid factor of multiple sites without organ or systems involvement: Secondary | ICD-10-CM

## 2020-12-24 ENCOUNTER — Other Ambulatory Visit: Payer: Self-pay | Admitting: Rheumatology

## 2020-12-24 DIAGNOSIS — M0579 Rheumatoid arthritis with rheumatoid factor of multiple sites without organ or systems involvement: Secondary | ICD-10-CM

## 2020-12-24 NOTE — Telephone Encounter (Signed)
Patient needs a follow-up appointment in January.

## 2020-12-24 NOTE — Telephone Encounter (Signed)
Attempted to contact patient to schedule follow-up visit for January, LVM advising patient to call the office to schedule.

## 2021-01-22 ENCOUNTER — Other Ambulatory Visit: Payer: Self-pay | Admitting: Rheumatology

## 2021-01-22 DIAGNOSIS — M0579 Rheumatoid arthritis with rheumatoid factor of multiple sites without organ or systems involvement: Secondary | ICD-10-CM

## 2021-01-23 NOTE — Telephone Encounter (Signed)
Last Visit: 08/06/2020 Next Visit: 01/25/2021 Labs: 08/06/2020, LFTs are mildly elevated. Eye exam: 01/09/2020 WNL 6 MONTHS, patient states she had PLQ eye exam in 05/2020, patient will fax results to office.  Sue Lush called patient to schedule f/u appt, eye exam and labs due  Current Dose per office note 08/06/2020, Plaquenil 200 mg 1 tablet twice daily. DX: Seropositive rheumatoid arthritis of multiple sites   Okay to refill Plaquenil?

## 2021-01-24 NOTE — Progress Notes (Signed)
Office Visit Note  Patient: Shelley Morrison             Date of Birth: 08-Aug-1966           MRN: [id number]             PCP: Gilda Crease, MD Referring: Gilda Crease, MD Visit Date: 01/25/2021 Occupation: @GUAROCC @  Subjective:  Medication monitoring  History of Present Illness: Shelley Morrison is a 55 y.o. female with history of seropositive rheumatoid arthritis.  She is taking Plaquenil 200 mg 1 tablet by mouth twice daily.  After her last office visit on 08/06/2020 she was restarted on methotrexate 6 tablets by mouth once weekly.  She did not return for lab work and only received 1 prescription for 24 tablets of methotrexate in September 2021.  She has not noticed any clinical improvement since her last office visit.  She continues to have flares 1-2 times per month.  Her most recent flare was in her left ankle joint when she noticed tenderness and swelling.  She had to miss work for several days due to being unable to bear full weight.  She states that her left hip joint pain has improved.  She is not having any pain or swelling in her hands or wrist joints at this time. She denies any recent infections.   Activities of Daily Living:  Patient reports morning stiffness for 30 minutes.   Patient Denies nocturnal pain.  Difficulty dressing/grooming: Denies Difficulty climbing stairs: Denies Difficulty getting out of chair: Denies Difficulty using hands for taps, buttons, cutlery, and/or writing: Denies  Review of Systems  Constitutional: Positive for fatigue.  HENT: Negative for mouth sores, mouth dryness and nose dryness.   Eyes: Positive for dryness. Negative for pain and itching.  Respiratory: Negative for shortness of breath and difficulty breathing.   Cardiovascular: Negative for chest pain and palpitations.  Gastrointestinal: Negative for blood in stool, constipation and diarrhea.  Endocrine: Negative for increased urination.  Genitourinary: Negative for difficulty  urinating.  Musculoskeletal: Positive for arthralgias, joint pain, joint swelling and morning stiffness. Negative for myalgias, muscle tenderness and myalgias.  Skin: Negative for color change, rash and redness.  Allergic/Immunologic: Negative for susceptible to infections.  Neurological: Negative for dizziness, numbness, headaches, memory loss and weakness.  Hematological: Negative for bruising/bleeding tendency.  Psychiatric/Behavioral: Negative for confusion.    PMFS History:  Patient Active Problem List   Diagnosis Date Noted  . 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 High risk medication use 06/18/2017  . Vitamin D deficiency 06/18/2017  . Primary insomnia 05/19/2017  . Essential hypertension 05/19/2017  . ANA positive 05/08/2017  . Rheumatoid factor positive 05/08/2017    Past Medical History:  Diagnosis Date  . Hypertension   . Lupus (HCC)   . Rheumatoid arthritis (HCC)     Family History  Problem Relation Age of Onset  . Healthy Son   . Healthy Daughter    Past Surgical History:  Procedure Laterality Date  . CHOLECYSTECTOMY     Social History   Social History Narrative  . Not on file   Immunization History  Administered Date(s) Administered  . PFIZER(Purple Top)SARS-COV-2 Vaccination 09/24/2020, 10/15/2020     Objective: Vital Signs: BP (!) 149/87 (BP Location: Left Arm, Patient Position: Sitting, Cuff Size: Large)   Pulse 67   Resp 16   Ht 5\' 10"  (1.778 m)   Wt 210 lb  6.4 oz (95.4 kg)   BMI 30.19 kg/m    Physical Exam Vitals and nursing note reviewed.  Constitutional:      Appearance: She is well-developed and well-nourished.  HENT:     Head: Normocephalic and atraumatic.  Eyes:     Extraocular Movements: EOM normal.     Conjunctiva/sclera: Conjunctivae normal.  Cardiovascular:     Pulses: Intact distal pulses.  Pulmonary:     Effort: Pulmonary effort is normal.   Abdominal:     Palpations: Abdomen is soft.  Musculoskeletal:     Cervical back: Normal range of motion.  Skin:    General: Skin is warm and dry.     Capillary Refill: Capillary refill takes less than 2 seconds.  Neurological:     Mental Status: She is alert and oriented to person, place, and time.  Psychiatric:        Mood and Affect: Mood and affect normal.        Behavior: Behavior normal.      Musculoskeletal Exam: C-spine, thoracic spine, lumbar spine have good range of motion with no discomfort.  No midline spinal tenderness.  No SI joint tenderness.  Shoulder joints, elbow joints, wrist joints, MCPs, PIPs, DIPs have good range of motion with no synovitis.  She has complete fist formation bilaterally.  Hip joints have good range of motion with no discomfort.  No tenderness over trochanteric bursa bilaterally.  Knee joints have good range of motion with no warmth or effusion.  Bilateral knee crepitus noted.  Warmth and tenderness over the left ankle joint noted.  She has good range of motion of both ankles.  CDAI Exam: CDAI Score: 0  Patient Global: 0 mm; Provider Global: 0 mm Swollen: 0 ; Tender: 0  Joint Exam 01/25/2021   No joint exam has been documented for this visit   There is currently no information documented on the homunculus. Go to the Rheumatology activity and complete the homunculus joint exam.  Investigation: No additional findings.  Imaging: No results found.  Recent Labs: Lab Results  Component Value Date   WBC 5.8 08/06/2020   HGB 13.7 08/06/2020   PLT 288 08/06/2020   NA 138 08/06/2020   K 4.7 08/06/2020   CL 103 08/06/2020   CO2 26 08/06/2020   GLUCOSE 85 08/06/2020   BUN 9 08/06/2020   CREATININE 0.89 08/06/2020   BILITOT 0.3 08/06/2020   ALKPHOS 45 08/24/2017   AST 22 08/06/2020   ALT 31 (H) 08/06/2020   PROT 7.5 08/06/2020   ALBUMIN 3.7 08/24/2017   CALCIUM 10.1 08/06/2020   GFRAA 86 08/06/2020    Speciality Comments: PLQ Eye Exam:  01/09/2020 WNL @ [facility] Follow up in 6 months.  Procedures:  No procedures performed Allergies: Patient has no known allergies.   Assessment / Plan:     Visit Diagnoses: Seropositive rheumatoid arthritis of multiple sites [facility]): She has tenderness and warmth in the left ankle joint on examination today.  She continues to have recurrent flares once to twice a month involving multiple joints.  Her most recent flare was in the left ankle which was tender and swollen and she was having difficulty bearing weight at that time.  Her left hip joint pain has resolved.  She has good range of motion of the left hip on examination today with no discomfort.  She has been taking Plaquenil 200 mg 1 tablet by mouth twice daily and has been tolerating without any side effects.  She has been taking naproxen on a daily basis for pain relief.  At her last office visit on 08/06/20 the plan was to restart her on methotrexate 6 tablets by mouth once weekly for 2 weeks and if her labs were stable at that time she was to increase to 8 tablets by mouth once weekly.  She received one prescription of methotrexate for 24 tablets  but has not had any refills and has not return for updated lab work.  We discussed restarting on methotrexate pending lab work today.  She was advised to avoid taking naproxen while on methotrexate.  Orders for CBC and CMP were released.  She will start on methotrexate 6 tablets by mouth once weekly for 2 weeks and if labs are stable she will increase to 8 tablets by mouth once weekly.  She will start taking folic acid 2 mg by mouth daily and continue on Plaquenil as prescribed.  She'll require updated lab work in 2 weeks x 2, then 2 months, then every 3 months.  Standing orders for CBC and CMP remain in place.  She will follow-up in the office in 6 weeks to assess her response to combination therapy.  High risk medication use - Methotrexate 6 tablets by mouth once weekly x2 weeks and if labs are  stable at that time she will increase to methotrexate 8 tablets by mouth once weekly.  She will take folic acid 2 mg by mouth daily and continue on Plaquenil 200 mg 1 tablet twice daily.  Eye Exam: 01/09/2020.  She had an updated PLQ eye exam, so we will call to obtain these records.  CBC and CMP updated on 08/06/20. She is due to update lab work.  Orders for CBC and CMP were released. She will return for lab work in 2 weeks x2, 2 months, then every 3 months to monitor for drug toxicity.  Standing orders for CBC and CMP are in place.     - Plan: CBC with Differential/Platelet, COMPLETE METABOLIC PANEL WITH GFR  She has not had any recent infections.  Advised to hold methotrexate if she develops signs or symptoms of an infection and to resume once the infection has completely cleared. She has received 2 Pfizer COVID-19 vaccinations.  Primary insomnia: She takes trazodone 150 mg at bedtime for insomnia.   History of hypertension: BP was 149/87 today in the office.   History of vitamin D deficiency: She is taking vitamin D 2,000 units daily.   Osteoporosis screening: DEXA ordered by Dr. Corliss Skains but has not been scheduled.   Orders: Orders Placed This Encounter  Procedures  . CBC with Differential/Platelet  . COMPLETE METABOLIC PANEL WITH GFR   No orders of the defined types were placed in this encounter.     Follow-Up Instructions: Return in about 6 weeks (around 03/08/2021) for Rheumatoid arthritis.   Gearldine Bienenstock, PA-C  Note - This record has been created using Dragon software.  Chart creation errors have been sought, but may not always  have been located. Such creation errors do not reflect on  the standard of medical care.

## 2021-01-25 ENCOUNTER — Encounter: Payer: Self-pay | Admitting: Physician Assistant

## 2021-01-25 ENCOUNTER — Other Ambulatory Visit: Payer: Self-pay

## 2021-01-25 ENCOUNTER — Ambulatory Visit: Payer: BC Managed Care – PPO | Admitting: Physician Assistant

## 2021-01-25 VITALS — BP 149/87 | HR 67 | Resp 16 | Ht 70.0 in | Wt 210.4 lb

## 2021-01-25 DIAGNOSIS — Z79899 Other long term (current) drug therapy: Secondary | ICD-10-CM | POA: Diagnosis not present

## 2021-01-25 DIAGNOSIS — Z13820 Encounter for screening for osteoporosis: Secondary | ICD-10-CM

## 2021-01-25 DIAGNOSIS — Z8639 Personal history of other endocrine, nutritional and metabolic disease: Secondary | ICD-10-CM

## 2021-01-25 DIAGNOSIS — F5101 Primary insomnia: Secondary | ICD-10-CM | POA: Diagnosis not present

## 2021-01-25 DIAGNOSIS — Z8679 Personal history of other diseases of the circulatory system: Secondary | ICD-10-CM

## 2021-01-25 DIAGNOSIS — M0579 Rheumatoid arthritis with rheumatoid factor of multiple sites without organ or systems involvement: Secondary | ICD-10-CM | POA: Diagnosis not present

## 2021-01-25 NOTE — Patient Instructions (Addendum)
Standing Labs We placed an order today for your standing lab work.   Please have your standing labs drawn in 2 weeks x2, 2 months, then every 3 months   If possible, please have your labs drawn 2 weeks prior to your appointment so that the provider can discuss your results at your appointment.  We have open lab daily Monday through Thursday from 8:30-12:30 PM and 1:30-4:30 PM and Friday from 8:30-12:30 PM and 1:30-4:00 PM at the office of Dr. Pollyann Savoy, [facility].   Please be advised, all patients with office appointments requiring lab work will take precedents over walk-in lab work.  If possible, please come for your lab work on Monday and Friday afternoons, as you may experience shorter wait times. The office is located at [address], [address], [place], [place] [address] No appointment is necessary.   Labs are drawn by [facility]. Please bring your co-pay at the time of your lab draw.  You may receive a bill from [facility] for your lab work.  Methotrexate tablets What is this medicine? METHOTREXATE (METH oh TREX ate) is a chemotherapy drug used to treat cancer including breast cancer, leukemia, and lymphoma. This medicine can also be used to treat psoriasis and certain kinds of arthritis. This medicine may be used for other purposes; ask your health care provider or pharmacist if you have questions. COMMON BRAND NAME(S): Rheumatrex, Trexall What should I tell my health care provider before I take this medicine? They need to know if you have any of these conditions:  fluid in the stomach area or lungs  if you often drink alcohol  infection or immune system problems  kidney disease or on hemodialysis  liver disease  low blood counts, like low Engelbrecht cell, platelet, or red cell counts  lung disease  radiation therapy  stomach ulcers  ulcerative colitis  an unusual or allergic reaction to methotrexate, other medicines, foods, dyes, or  preservatives  pregnant or trying to get pregnant  breast-feeding How should I use this medicine? Take this medicine by mouth with a glass of water. Follow the directions on the prescription label. Take your medicine at regular intervals. Do not take it more often than directed. Do not stop taking except on your doctor's advice. Make sure you know why you are taking this medicine and how often you should take it. If this medicine is used for a condition that is not cancer, like arthritis or psoriasis, it should be taken weekly, NOT daily. Taking this medicine more often than directed can cause serious side effects, even death. Talk to your healthcare provider about safe handling and disposal of this medicine. You may need to take special precautions. Talk to your pediatrician regarding the use of this medicine in children. While this drug may be prescribed for selected conditions, precautions do apply. Overdosage: If you think you have taken too much of this medicine contact a poison control center or emergency room at once. NOTE: This medicine is only for you. Do not share this medicine with others. What if I miss a dose? If you miss a dose, talk with your doctor or health care professional. Do not take double or extra doses. What may interact with this medicine? Do not take this medicine with any of the following medications:  acitretin This medicine may also interact with the following medication:  aspirin and aspirin-like medicines including salicylates  azathioprine  certain antibiotics like penicillins, tetracycline, and chloramphenicol  certain medicines that treat or  prevent blood clots like warfarin, apixaban, dabigatran, and rivaroxaban  certain medicines for stomach problems like esomeprazole, omeprazole, pantoprazole  cyclosporine  dapsone  diuretics  gold  hydroxychloroquine  live virus vaccines  medicines for infection like acyclovir, adefovir, amphotericin B,  bacitracin, cidofovir, foscarnet, ganciclovir, gentamicin, pentamidine, vancomycin  mercaptopurine  NSAIDs, medicines for pain and inflammation, like ibuprofen or naproxen  other cytotoxic agents  pamidronate  pemetrexed  penicillamine  phenylbutazone  phenytoin  probenecid  pyrimethamine  retinoids such as isotretinoin and tretinoin  steroid medicines like prednisone or cortisone  sulfonamides like sulfasalazine and trimethoprim/sulfamethoxazole  theophylline  zoledronic acid This list may not describe all possible interactions. Give your health care provider a list of all the medicines, herbs, non-prescription drugs, or dietary supplements you use. Also tell them if you smoke, drink alcohol, or use illegal drugs. Some items may interact with your medicine. What should I watch for while using this medicine? Avoid alcoholic drinks. This medicine can make you more sensitive to the sun. Keep out of the sun. If you cannot avoid being in the sun, wear protective clothing and use sunscreen. Do not use sun lamps or tanning beds/booths. You may need blood work done while you are taking this medicine. Call your doctor or health care professional for advice if you get a fever, chills or sore throat, or other symptoms of a cold or flu. Do not treat yourself. This drug decreases your body's ability to fight infections. Try to avoid being around people who are sick. This medicine may increase your risk to bruise or bleed. Call your doctor or health care professional if you notice any unusual bleeding. Be careful brushing or flossing your teeth or using a toothpick because you may get an infection or bleed more easily. If you have any dental work done, tell your dentist you are receiving this medicine. Check with your doctor or health care professional if you get an attack of severe diarrhea, nausea and vomiting, or if you sweat a lot. The loss of too much body fluid can make it dangerous  for you to take this medicine. Talk to your doctor about your risk of cancer. You may be more at risk for certain types of cancers if you take this medicine. Do not become pregnant while taking this medicine or for 6 months after stopping it. Women should inform their health care provider if they wish to become pregnant or think they might be pregnant. Men should not father a child while taking this medicine and for 3 months after stopping it. There is potential for serious harm to an unborn child. Talk to your health care provider for more information. Do not breast-feed an infant while taking this medicine or for 1 week after stopping it. This medicine may make it more difficult to get pregnant or father a child. Talk to your health care provider if you are concerned about your fertility. What side effects may I notice from receiving this medicine? Side effects that you should report to your doctor or health care professional as soon as possible:  allergic reactions like skin rash, itching or hives, swelling of the face, lips, or tongue  breathing problems or shortness of breath  diarrhea  dry, nonproductive cough  low blood counts - this medicine may decrease the number of Borak blood cells, red blood cells and platelets. You may be at increased risk for infections and bleeding.  mouth sores  redness, blistering, peeling or loosening of the skin, including  inside the mouth  signs of infection - fever or chills, cough, sore throat, pain or trouble passing urine  signs and symptoms of bleeding such as bloody or black, tarry stools; red or dark-brown urine; spitting up blood or brown material that looks like coffee grounds; red spots on the skin; unusual bruising or bleeding from the eye, gums, or nose  signs and symptoms of kidney injury like trouble passing urine or change in the amount of urine  signs and symptoms of liver injury like dark yellow or brown urine; general ill feeling or  flu-like symptoms; light-colored stools; loss of appetite; nausea; right upper belly pain; unusually weak or tired; yellowing of the eyes or skin Side effects that usually do not require medical attention (report to your doctor or health care professional if they continue or are bothersome):  dizziness  hair loss  tiredness  upset stomach  vomiting This list may not describe all possible side effects. Call your doctor for medical advice about side effects. You may report side effects to FDA at 1-800-FDA-1088. Where should I keep my medicine? Keep out of the reach of children and pets. Store at room temperature between 20 and 25 degrees C (68 and 77 degrees F). Protect from light. Get rid of any unused medicine after the expiration date. Talk to your health care provider about how to dispose of unused medicine. Special directions may apply. NOTE: This sheet is a summary. It may not cover all possible information. If you have questions about this medicine, talk to your doctor, pharmacist, or health care provider.  2021 Elsevier/Gold Standard (2020-07-16 10:40:39)   If you wish to have your labs drawn at another location, please call the office 24 hours in advance to send orders.  If you have any questions regarding directions or hours of operation,  please call [phone].   As a reminder, please drink plenty of water prior to coming for your lab work. Thanks!

## 2021-01-25 NOTE — Telephone Encounter (Signed)
Pending lab results, patient will be re-starting on methotrexate and folic acid per Sherron Ales, PA-C. Methotrexate 6 tabs/wk x 2 weeks, if labs are stable increase to 8 tabs/wk. Folic acid 2mg  daily, per , PA-C.

## 2021-01-26 LAB — CBC WITH DIFFERENTIAL/PLATELET
Absolute Monocytes: 459 cells/uL (ref 200–950)
Basophils Absolute: 50 cells/uL (ref 0–200)
Basophils Relative: 1.1 %
Eosinophils Absolute: 122 cells/uL (ref 15–500)
Eosinophils Relative: 2.7 %
HCT: 40.5 % (ref 35.0–45.0)
Hemoglobin: 13.2 g/dL (ref 11.7–15.5)
Lymphs Abs: 1899 cells/uL (ref 850–3900)
MCH: 28.6 pg (ref 27.0–33.0)
MCHC: 32.6 g/dL (ref 32.0–36.0)
MCV: 87.7 fL (ref 80.0–100.0)
MPV: 10.5 fL (ref 7.5–12.5)
Monocytes Relative: 10.2 %
Neutro Abs: 1971 cells/uL (ref 1500–7800)
Neutrophils Relative %: 43.8 %
Platelets: 279 10*3/uL (ref 140–400)
RBC: 4.62 10*6/uL (ref 3.80–5.10)
RDW: 12.7 % (ref 11.0–15.0)
Total Lymphocyte: 42.2 %
WBC: 4.5 10*3/uL (ref 3.8–10.8)

## 2021-01-26 LAB — COMPLETE METABOLIC PANEL WITH GFR
AG Ratio: 1.2 (calc) (ref 1.0–2.5)
ALT: 36 U/L — ABNORMAL HIGH (ref 6–29)
AST: 25 U/L (ref 10–35)
Albumin: 3.9 g/dL (ref 3.6–5.1)
Alkaline phosphatase (APISO): 69 U/L (ref 37–153)
BUN: 8 mg/dL (ref 7–25)
CO2: 27 mmol/L (ref 20–32)
Calcium: 9.6 mg/dL (ref 8.6–10.4)
Chloride: 106 mmol/L (ref 98–110)
Creat: 0.89 mg/dL (ref 0.50–1.05)
GFR, Est African American: 85 mL/min/{1.73_m2} (ref >=60)
GFR, Est Non African American: 73 mL/min/{1.73_m2} (ref >=60)
Globulin: 3.2 g/dL (calc) (ref 1.9–3.7)
Glucose, Bld: 97 mg/dL (ref 65–99)
Potassium: 4.5 mmol/L (ref 3.5–5.3)
Sodium: 138 mmol/L (ref 135–146)
Total Bilirubin: 0.4 mg/dL (ref 0.2–1.2)
Total Protein: 7.1 g/dL (ref 6.1–8.1)

## 2021-01-28 MED ORDER — METHOTREXATE 2.5 MG PO TABS: ORAL_TABLET | ORAL | 0 refills | Status: DC

## 2021-01-28 MED ORDER — FOLIC ACID 1 MG PO TABS: 1.0000 mg | ORAL_TABLET | Freq: Every day | ORAL | 3 refills | Status: DC

## 2021-01-28 NOTE — Telephone Encounter (Signed)
Per Sherron Ales, PA-C, (See lab note) The plan was to restart the patient on methotrexate. Please advise the patient to start on methotrexate 4 tablets by mouth once weekly and then recheck lab work in 2 weeks. If ALT remains elevated or trends up we will need to bring her back to the office to discuss other treatment options.

## 2021-01-28 NOTE — Progress Notes (Signed)
CBC WNL. ALT is borderline elevated-36.  AST WNL.  Rest of CMP WNL. Please clarify if the patient has been taking any tylenol, NSAIDs, or alcohol use. It appears that she may take naproxen as needed.   The plan was to restart the patient on methotrexate. Please advise the patient to start on methotrexate 4 tablets by mouth once weekly and then recheck lab work in 2 weeks. If ALT remains elevated or trends up we will need to bring her back to the office to discuss other treatment options.   Please advise the patient to avoid tylenol, NSAIDs, and alcohol use.

## 2021-02-22 NOTE — Progress Notes (Deleted)
Office Visit Note  Patient: Shelley Morrison             Date of Birth: 03-Jan-1966           MRN: [id number]             PCP: Gilda Crease, MD Referring: Gilda Crease, MD Visit Date: 03/08/2021 Occupation: @GUAROCC @  Subjective:  No chief complaint on file.   History of Present Illness: Shelley Morrison is a 55 y.o. female ***   Activities of Daily Living:  Patient reports morning stiffness for *** {minute/hour:19697}.   Patient {ACTIONS;DENIES/REPORTS:21021675::"Denies"} nocturnal pain.  Difficulty dressing/grooming: {ACTIONS;DENIES/REPORTS:21021675::"Denies"} Difficulty climbing stairs: {ACTIONS;DENIES/REPORTS:21021675::"Denies"} Difficulty getting out of chair: {ACTIONS;DENIES/REPORTS:21021675::"Denies"} Difficulty using hands for taps, buttons, cutlery, and/or writing: {ACTIONS;DENIES/REPORTS:21021675::"Denies"}  No Rheumatology ROS completed.   PMFS History:  Patient Active Problem List   Diagnosis Date Noted  . 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 High risk medication use 06/18/2017  . Vitamin D deficiency 06/18/2017  . Primary insomnia 05/19/2017  . Essential hypertension 05/19/2017  . ANA positive 05/08/2017  . Rheumatoid factor positive 05/08/2017    Past Medical History:  Diagnosis Date  . Hypertension   . Lupus (HCC)   . Rheumatoid arthritis (HCC)     Family History  Problem Relation Age of Onset  . Healthy Son   . Healthy Daughter    Past Surgical History:  Procedure Laterality Date  . CHOLECYSTECTOMY     Social History   Social History Narrative  . Not on file   Immunization History  Administered Date(s) Administered  . PFIZER(Purple Top)SARS-COV-2 Vaccination 09/24/2020, 10/15/2020     Objective: Vital Signs: There were no vitals taken for this visit.   Physical Exam   Musculoskeletal Exam: ***  CDAI Exam: CDAI Score: -- Patient Global: --;  Provider Global: -- Swollen: --; Tender: -- Joint Exam 03/08/2021   No joint exam has been documented for this visit   There is currently no information documented on the homunculus. Go to the Rheumatology activity and complete the homunculus joint exam.  Investigation: No additional findings.  Imaging: No results found.  Recent Labs: Lab Results  Component Value Date   WBC 4.5 01/25/2021   HGB 13.2 01/25/2021   PLT 279 01/25/2021   NA 138 01/25/2021   K 4.5 01/25/2021   CL 106 01/25/2021   CO2 27 01/25/2021   GLUCOSE 97 01/25/2021   BUN 8 01/25/2021   CREATININE 0.89 01/25/2021   BILITOT 0.4 01/25/2021   ALKPHOS 45 08/24/2017   AST 25 01/25/2021   ALT 36 (H) 01/25/2021   PROT 7.1 01/25/2021   ALBUMIN 3.7 08/24/2017   CALCIUM 9.6 01/25/2021   GFRAA 85 01/25/2021    Speciality Comments: PLQ Eye Exam: 01/09/2020 WNL @ [facility] Follow up in 6 months.  Procedures:  No procedures performed Allergies: Patient has no known allergies.   Assessment / Plan:     Visit Diagnoses: Seropositive rheumatoid arthritis of multiple sites (HCC)  High risk medication use  Primary insomnia  History of hypertension  History of vitamin D deficiency  Osteoporosis screening  Postmenopausal  Orders: No orders of the defined types were placed in this encounter.  No orders of the defined types were placed in this encounter.   Face-to-face time spent with patient was *** minutes. Greater than 50% of time was spent in counseling and coordination of care.  Follow-Up Instructions: No  follow-ups on file.   Ofilia Neas, PA-C  Note - This record has been created using Dragon software.  Chart creation errors have been sought, but may not always  have been located. Such creation errors do not reflect on  the standard of medical care.

## 2021-03-08 ENCOUNTER — Ambulatory Visit: Payer: BC Managed Care – PPO | Admitting: Rheumatology

## 2021-03-08 DIAGNOSIS — Z8639 Personal history of other endocrine, nutritional and metabolic disease: Secondary | ICD-10-CM

## 2021-03-08 DIAGNOSIS — Z79899 Other long term (current) drug therapy: Secondary | ICD-10-CM

## 2021-03-08 DIAGNOSIS — Z8679 Personal history of other diseases of the circulatory system: Secondary | ICD-10-CM

## 2021-03-08 DIAGNOSIS — Z780 Asymptomatic menopausal state: Secondary | ICD-10-CM

## 2021-03-08 DIAGNOSIS — Z13820 Encounter for screening for osteoporosis: Secondary | ICD-10-CM

## 2021-03-08 DIAGNOSIS — M0579 Rheumatoid arthritis with rheumatoid factor of multiple sites without organ or systems involvement: Secondary | ICD-10-CM

## 2021-03-08 DIAGNOSIS — F5101 Primary insomnia: Secondary | ICD-10-CM

## 2021-03-08 NOTE — Progress Notes (Signed)
Office Visit Note  Patient: Shelley Morrison             Date of Birth: [birth year]           MRN: [id number]             PCP: Gilda Crease, MD Referring: Gilda Crease, MD Visit Date: 03/15/2021 Occupation: @GUAROCC @  Subjective:  Medication monitoring   History of Present Illness: Shelley Morrison is a 55 y.o. female with history of seropositive rheumatoid arthritis.  She is taking  plaquenil 200 mg 1 tablet by mouth twice daily.  She is tolerating Plaquenil without any side effects.  She reports that she tried taking methotrexate 4 tablets by mouth once weekly for 2 weeks and increase to 6 tablets by mouth once weekly for about 4 weeks but she discontinued last week due to experiencing ongoing nausea, fatigue, and brain fog.  She states that she does not want to restart on methotrexate.  She states that she did not notice any clinical benefit while taking methotrexate.  She denies any recent flares.  She is not having any joint pain or joint swelling at this time.  She states her morning stiffness has been lasting about 20 minutes but has been tolerable overall.  She denies any new concerns.     Activities of Daily Living:  Patient reports morning stiffness for 20 minutes.   Patient Reports nocturnal pain.  Difficulty dressing/grooming: Denies Difficulty climbing stairs: Denies Difficulty getting out of chair: Denies Difficulty using hands for taps, buttons, cutlery, and/or writing: Denies  Review of Systems  Constitutional: Positive for fatigue.  HENT: Negative for mouth dryness.   Eyes: Positive for dryness.  Respiratory: Negative for shortness of breath.   Cardiovascular: Negative for swelling in legs/feet.  Gastrointestinal: Positive for constipation and diarrhea.  Endocrine: Positive for heat intolerance.  Genitourinary: Negative for difficulty urinating.  Musculoskeletal: Positive for arthralgias, joint pain, joint swelling and morning stiffness.  Skin: Negative for  rash.  Allergic/Immunologic: Negative for susceptible to infections.  Neurological: Negative for numbness.  Hematological: Negative for bruising/bleeding tendency.  Psychiatric/Behavioral: Positive for sleep disturbance.    PMFS History:  Patient Active Problem List   Diagnosis Date Noted  . 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 High risk medication use 06/18/2017  . Vitamin D deficiency 06/18/2017  . Primary insomnia 05/19/2017  . Essential hypertension 05/19/2017  . ANA positive 05/08/2017  . Rheumatoid factor positive 05/08/2017    Past Medical History:  Diagnosis Date  . Hypertension   . Lupus (HCC)   . Rheumatoid arthritis (HCC)     Family History  Problem Relation Age of Onset  . Healthy Son   . Healthy Daughter    Past Surgical History:  Procedure Laterality Date  . CHOLECYSTECTOMY     Social History   Social History Narrative  . Not on file   Immunization History  Administered Date(s) Administered  . PFIZER(Purple Top)SARS-COV-2 Vaccination 09/24/2020, 10/15/2020     Objective: Vital Signs: BP 123/69 (BP Location: Left Arm, Patient Position: Sitting, Cuff Size: Normal)   Pulse 76   Resp 15   Ht 5\' 10"  (1.778 m)   Wt 210 lb (95.3 kg)   BMI 30.13 kg/m    Physical Exam Vitals and nursing note reviewed.  Constitutional:      Appearance: She is well-developed.  HENT:     Head: Normocephalic and atraumatic.  Eyes:     Conjunctiva/sclera: Conjunctivae normal.  Pulmonary:     Effort: Pulmonary effort is normal.  Abdominal:     Palpations: Abdomen is soft.  Musculoskeletal:     Cervical back: Normal range of motion.  Skin:    General: Skin is warm and dry.     Capillary Refill: Capillary refill takes less than 2 seconds.  Neurological:     Mental Status: She is alert and oriented to person, place, and time.  Psychiatric:        Behavior: Behavior normal.       Musculoskeletal Exam: C-spine, thoracic spine, lumbar spine have good range of motion with no discomfort.  Shoulder joints, elbow joints, wrist joints, MCPs, PIPs, DIPs have good range of motion with no synovitis.  She was able to make a complete fist bilaterally.  Hip joints have good range of motion with no discomfort.  No tenderness over trochanteric bursa bilaterally.  Knee joints have good range of motion with no warmth or effusion.  Ankle joints have good range of motion with no tenderness or inflammation.  CDAI Exam: CDAI Score: 0  Patient Global: 0 mm; Provider Global: 0 mm Swollen: 0 ; Tender: 0  Joint Exam 03/15/2021   No joint exam has been documented for this visit   There is currently no information documented on the homunculus. Go to the Rheumatology activity and complete the homunculus joint exam.  Investigation: No additional findings.  Imaging: No results found.  Recent Labs: Lab Results  Component Value Date   WBC 4.5 01/25/2021   HGB 13.2 01/25/2021   PLT 279 01/25/2021   NA 138 01/25/2021   K 4.5 01/25/2021   CL 106 01/25/2021   CO2 27 01/25/2021   GLUCOSE 97 01/25/2021   BUN 8 01/25/2021   CREATININE 0.89 01/25/2021   BILITOT 0.4 01/25/2021   ALKPHOS 45 08/24/2017   AST 25 01/25/2021   ALT 36 (H) 01/25/2021   PROT 7.1 01/25/2021   ALBUMIN 3.7 08/24/2017   CALCIUM 9.6 01/25/2021   GFRAA 85 01/25/2021    Speciality Comments: PLQ Eye Exam: 01/09/2020 WNL @ [facility] Follow up in 6  **Patient is scheduled for PLQ eye exam in 04/2021  Procedures:  No procedures performed Allergies: Patient has no known allergies.   Assessment / Plan:     Visit Diagnoses: Seropositive rheumatoid arthritis of multiple sites [facility]): She has no joint tenderness or synovitis on exam.  She has not had any recent rheumatoid arthritis flares.  She is clinically doing well taking Plaquenil 200 mg 1 tablet by mouth twice daily.  According to the patient she tried  taking methotrexate 4 tablets by mouth weekly x2 weeks then increase to 6 tablets by mouth once weekly for 6 weeks.  She discontinued methotrexate last week due to experiencing ongoing nausea, fatigue, and brain fog.  She does not want to restart methotrexate at this time.  She did not notice any clinical benefit while on combination therapy.  She had no inflammation on examination today.  Her morning stiffness has been lasting about 20 minutes but has been manageable.  She will continue on Plaquenil 200 mg 1 tablet by mouth twice daily.  She does not need a refill at this time.  She was advised to notify us if she develops increased joint pain or joint swelling while on monotherapy.  She will follow-up in the office in 5 months.  High risk medication use - Plaquenil 200 mg 1  tablet twice daily.  D/c MTX-fatigue, nausea, and brain fog. CBC and CMP updated on 01/25/21.  ALT was 36 on 01/25/2021.  She took methotrexate for about 6 weeks but discontinued last week due to ongoing nausea and fatigue.  We will check CBC and CMP today.  Orders for CBC and CMP released. PLQ Eye Exam: 1/11/2021PLQ eye exam is scheduled in May 2022.  She was given a Plaquenil eye exam form to take with her to her upcoming appointment. - Plan: COMPLETE METABOLIC PANEL WITH GFR, CBC with Differential/Platelet  Primary insomnia: She continues to have interrupted sleep at night.  She has been taking trazodone 150 mg by mouth at bedtime.  History of hypertension: Blood pressure was 123/69 today in the office.  History of vitamin D deficiency: She is taking vitamin D 2000 units daily.  Osteoporosis screening -  DEXA ordered by Dr. Corliss Skains but has not been scheduled.  She is taking vitamin D 2000 units daily.  Orders: Orders Placed This Encounter  Procedures  . COMPLETE METABOLIC PANEL WITH GFR  . CBC with Differential/Platelet   No orders of the defined types were placed in this encounter.    Follow-Up Instructions: Return in  about 5 months (around 08/15/2021) for Rheumatoid arthritis.   Gearldine Bienenstock, PA-C  Note - This record has been created using Dragon software.  Chart creation errors have been sought, but may not always  have been located. Such creation errors do not reflect on  the standard of medical care.

## 2021-03-09 ENCOUNTER — Other Ambulatory Visit: Payer: Self-pay | Admitting: Rheumatology

## 2021-03-09 DIAGNOSIS — M0579 Rheumatoid arthritis with rheumatoid factor of multiple sites without organ or systems involvement: Secondary | ICD-10-CM

## 2021-03-11 NOTE — Telephone Encounter (Addendum)
Last Visit: 01/25/2021 Next Visit: 03/15/2021 Labs: 01/25/2021, CBC WNL. ALT is borderline elevated-36. AST WNL. Rest of CMP WNL. Please clarify if the patient has been taking any tylenol, NSAIDs, or alcohol use. It appears that she may take naproxen as needed.  The plan was to restart the patient on methotrexate. Please advise the patient to start on methotrexate 4 tablets by mouth once weekly and then recheck lab work in 2 weeks. If ALT remains elevated or trends up we will need to bring her back to the office to discuss other treatment options.   Please advise the patient to avoid tylenol, NSAIDs, and alcohol use.  Eye exam:  1/11/202, **Patient is scheduled for PLQ eye exam in 04/2021  Current Dose per office note 01/25/2021, Plaquenil 200 mg 1 tablet twice daily QM:[id number] rheumatoid arthritis of multiple sites  Last Fill: 01/23/2021  Okay to refill Plaquenil?

## 2021-03-15 ENCOUNTER — Encounter: Payer: Self-pay | Admitting: Physician Assistant

## 2021-03-15 ENCOUNTER — Ambulatory Visit: Payer: BC Managed Care – PPO | Admitting: Physician Assistant

## 2021-03-15 ENCOUNTER — Other Ambulatory Visit: Payer: Self-pay

## 2021-03-15 VITALS — BP 123/69 | HR 76 | Resp 15 | Ht 70.0 in | Wt 210.0 lb

## 2021-03-15 DIAGNOSIS — F5101 Primary insomnia: Secondary | ICD-10-CM

## 2021-03-15 DIAGNOSIS — Z8679 Personal history of other diseases of the circulatory system: Secondary | ICD-10-CM

## 2021-03-15 DIAGNOSIS — Z8639 Personal history of other endocrine, nutritional and metabolic disease: Secondary | ICD-10-CM

## 2021-03-15 DIAGNOSIS — M0579 Rheumatoid arthritis with rheumatoid factor of multiple sites without organ or systems involvement: Secondary | ICD-10-CM | POA: Diagnosis not present

## 2021-03-15 DIAGNOSIS — Z13820 Encounter for screening for osteoporosis: Secondary | ICD-10-CM

## 2021-03-15 DIAGNOSIS — Z79899 Other long term (current) drug therapy: Secondary | ICD-10-CM

## 2021-03-16 LAB — COMPLETE METABOLIC PANEL WITH GFR
AG Ratio: 1.2 (calc) (ref 1.0–2.5)
ALT: 38 U/L — ABNORMAL HIGH (ref 6–29)
AST: 27 U/L (ref 10–35)
Albumin: 4.1 g/dL (ref 3.6–5.1)
Alkaline phosphatase (APISO): 74 U/L (ref 37–153)
BUN: 10 mg/dL (ref 7–25)
CO2: 26 mmol/L (ref 20–32)
Calcium: 9.5 mg/dL (ref 8.6–10.4)
Chloride: 106 mmol/L (ref 98–110)
Creat: 0.83 mg/dL (ref 0.50–1.05)
GFR, Est African American: 93 mL/min/{1.73_m2} (ref >=60)
GFR, Est Non African American: 80 mL/min/{1.73_m2} (ref >=60)
Globulin: 3.3 g/dL (calc) (ref 1.9–3.7)
Glucose, Bld: 90 mg/dL (ref 65–99)
Potassium: 4.3 mmol/L (ref 3.5–5.3)
Sodium: 140 mmol/L (ref 135–146)
Total Bilirubin: 0.4 mg/dL (ref 0.2–1.2)
Total Protein: 7.4 g/dL (ref 6.1–8.1)

## 2021-03-16 LAB — CBC WITH DIFFERENTIAL/PLATELET
Absolute Monocytes: 673 cells/uL (ref 200–950)
Basophils Absolute: 52 cells/uL (ref 0–200)
Basophils Relative: 0.9 %
Eosinophils Absolute: 128 cells/uL (ref 15–500)
Eosinophils Relative: 2.2 %
HCT: 39.5 % (ref 35.0–45.0)
Hemoglobin: 13.1 g/dL (ref 11.7–15.5)
Lymphs Abs: 2175 cells/uL (ref 850–3900)
MCH: 28.7 pg (ref 27.0–33.0)
MCHC: 33.2 g/dL (ref 32.0–36.0)
MCV: 86.6 fL (ref 80.0–100.0)
MPV: 10.4 fL (ref 7.5–12.5)
Monocytes Relative: 11.6 %
Neutro Abs: 2772 cells/uL (ref 1500–7800)
Neutrophils Relative %: 47.8 %
Platelets: 277 10*3/uL (ref 140–400)
RBC: 4.56 10*6/uL (ref 3.80–5.10)
RDW: 12.6 % (ref 11.0–15.0)
Total Lymphocyte: 37.5 %
WBC: 5.8 10*3/uL (ref 3.8–10.8)

## 2021-03-18 NOTE — Progress Notes (Signed)
ALT is borderline elevated and continues to trend up slightly.  AST is WNL.  Rest of CMP WNL.  CBC WNL.  She is no longer taking MTX.  Please clarify if she has been taking tylenol, NSAIDs, or alcohol.  Please forward lab work to PCP.

## 2021-05-02 ENCOUNTER — Other Ambulatory Visit: Payer: Self-pay | Admitting: Rheumatology

## 2021-05-02 DIAGNOSIS — M0579 Rheumatoid arthritis with rheumatoid factor of multiple sites without organ or systems involvement: Secondary | ICD-10-CM

## 2021-05-02 NOTE — Telephone Encounter (Signed)
Last Visit: 03/15/2021 Next Visit: 08/16/2021 Labs: 03/15/2021, ALT is borderline elevated and continues to trend up slightly. AST is WNL. Rest of CMP WNL. CBC WNL. She is no longer taking MTX. Please clarify if she has been taking tylenol, NSAIDs, or alcohol. Please forward lab work to PCP.  Eye exam: 04/2021  Current Dose per office note 03/15/2021,  Plaquenil 200 mg 1 tablet twice daily  DX: Seropositive rheumatoid arthritis of multiple sites   Last Fill: 03/11/2021  Okay to refill Plaquenil?

## 2021-05-06 DIAGNOSIS — M0569 Rheumatoid arthritis of multiple sites with involvement of other organs and systems: Secondary | ICD-10-CM | POA: Diagnosis not present

## 2021-05-06 DIAGNOSIS — M329 Systemic lupus erythematosus, unspecified: Secondary | ICD-10-CM | POA: Diagnosis not present

## 2021-05-06 DIAGNOSIS — Z79899 Other long term (current) drug therapy: Secondary | ICD-10-CM | POA: Diagnosis not present

## 2021-06-04 NOTE — Progress Notes (Signed)
Office Visit Note  Patient: Shelley Morrison             Date of Birth: 1966/05/04           MRN: [id number]             PCP: Gilda Crease, MD Referring: Gilda Crease, MD Visit Date: 06/05/2021 Occupation: @GUAROCC @  Subjective:  Generalized pain   History of Present Illness: Shelley Morrison is a 55 y.o. female with history of seropositive rheumatoid arthritis.  She is taking plaquenil 200 mg 1 tablet by mouth twice daily.  To tolerate Plaquenil without any side effects.  She denies missing any doses of Plaquenil recently.  Patient reports that she has been having a flare for the past 1 week.  She states that she is having total body pain and stiffness. She denies any change in activity prior to the onset of the flare. It has been difficult for her to get out of bed in the morning due to the severity of pain and fatigue.  She has been having to use more FMLA days recently.       Activities of Daily Living:  Patient reports joint stiffness all day  Patient Denies nocturnal pain.  Difficulty dressing/grooming: Denies Difficulty climbing stairs: Reports Difficulty getting out of chair: Reports Difficulty using hands for taps, buttons, cutlery, and/or writing: Denies  Review of Systems  Constitutional: Positive for fatigue.  HENT: Negative for mouth dryness.   Eyes: Positive for dryness.  Respiratory: Negative for shortness of breath.   Cardiovascular: Positive for swelling in legs/feet.  Gastrointestinal: Negative for constipation.  Endocrine: Negative for excessive thirst.  Genitourinary: Negative for difficulty urinating.  Musculoskeletal: Positive for arthralgias, joint pain, joint swelling, morning stiffness and muscle tenderness.  Skin: Negative for rash.  Allergic/Immunologic: Negative for susceptible to infections.  Neurological: Negative for weakness.  Hematological: Negative for bruising/bleeding tendency.  Psychiatric/Behavioral: Positive for sleep disturbance.     PMFS History:  Patient Active Problem List   Diagnosis Date Noted  . 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 High risk medication use 06/18/2017  . Vitamin D deficiency 06/18/2017  . Primary insomnia 05/19/2017  . Essential hypertension 05/19/2017  . ANA positive 05/08/2017  . Rheumatoid factor positive 05/08/2017    Past Medical History:  Diagnosis Date  . Hypertension   . Lupus (HCC)   . Rheumatoid arthritis (HCC)     Family History  Problem Relation Age of Onset  . Healthy Son   . Healthy Daughter    Past Surgical History:  Procedure Laterality Date  . CHOLECYSTECTOMY     Social History   Social History Narrative  . Not on file   Immunization History  Administered Date(s) Administered  . PFIZER(Purple Top)SARS-COV-2 Vaccination 09/24/2020, 10/15/2020     Objective: Vital Signs: BP 128/61 (BP Location: Left Arm, Patient Position: Sitting, Cuff Size: Normal)   Pulse 76   Resp 15   Ht 5\' 10"  (1.778 m)   Wt 218 lb (98.9 kg)   BMI 31.28 kg/m    Physical Exam Vitals and nursing note reviewed.  Constitutional:      Appearance: She is well-developed.  HENT:     Head: Normocephalic and atraumatic.  Eyes:     Conjunctiva/sclera: Conjunctivae normal.  Pulmonary:     Effort: Pulmonary effort is normal.  Abdominal:     Palpations: Abdomen is soft.  Musculoskeletal:  Cervical back: Normal range of motion.  Skin:    General: Skin is warm and dry.     Capillary Refill: Capillary refill takes less than 2 seconds.  Neurological:     Mental Status: She is alert and oriented to person, place, and time.  Psychiatric:        Behavior: Behavior normal.      Musculoskeletal Exam: Generalized pain and stiffness with ROM exercises.  C-spine, thoracic spine, and lumbar spine good ROM.  Shoulder joints have good ROM with some tenderness over the right shoulder.  Elbow joints and wrist  joints have good ROM with no tenderness or inflammation.  Tenderness and synovitis of the right 2nd and 3rd MCP joints.  Tenderness over the right 1st and 4th MCP joints and 2nd and 3rd PIP joints.  Painful ROM of both hips.  No tenderness over trochanteric bursa.  Warmth and swelling of the right knee.  Left knee joint has good ROM with no warmth or effusion.  Tenderness over the left ankle joint.   CDAI Exam: CDAI Score: 12.4  Patient Global: 8 mm; Provider Global: 6 mm Swollen: 3 ; Tender: 9  Joint Exam 06/05/2021      Right  Left  Glenohumeral   Tender     MCP 1   Tender     MCP 2  Swollen Tender     MCP 3  Swollen Tender     MCP 4   Tender     PIP 2   Tender     PIP 3   Tender     Knee  Swollen Tender     Ankle      Tender     Investigation: No additional findings.  Imaging: No results found.  Recent Labs: Lab Results  Component Value Date   WBC 5.8 03/15/2021   HGB 13.1 03/15/2021   PLT 277 03/15/2021   NA 140 03/15/2021   K 4.3 03/15/2021   CL 106 03/15/2021   CO2 26 03/15/2021   GLUCOSE 90 03/15/2021   BUN 10 03/15/2021   CREATININE 0.83 03/15/2021   BILITOT 0.4 03/15/2021   ALKPHOS 45 08/24/2017   AST 27 03/15/2021   ALT 38 (H) 03/15/2021   PROT 7.4 03/15/2021   ALBUMIN 3.7 08/24/2017   CALCIUM 9.5 03/15/2021   GFRAA 93 03/15/2021    Speciality Comments: PLQ Eye Exam: 05/06/2021 WNL @ [facility] Follow up in 6 months   Procedures:  No procedures performed Allergies: Patient has no known allergies.   Assessment / Plan:     Visit Diagnoses: Seropositive rheumatoid arthritis of multiple sites [facility]): She presents today with pain and inflammation in multiple joints which started 1 week ago.  She continues to have recurrent rheumatoid arthritis flares.  She has been taking Plaquenil 200 mg 1 tablet by mouth twice daily as prescribed.  According to the patient she has not missed any doses recently.  In the past she could not tolerate methotrexate  and discontinued.  She has been taking Plaquenil as monotherapy.  She has not been taking any over-the-counter products for symptomatic relief.  On examination she has tenderness over the right shoulder, synovitis of the right second and third MCP joints, and warmth and swelling in the right knee joint.  Her rheumatoid arthritis is uncontrolled on monotherapy.  We discussed the importance of controlling her rheumatoid arthritis to avoid other systemic involvement.  Indications, contraindications, and potential side effects of Arava were discussed today in  detail.  All questions were addressed and consent was obtained.  She will start on Arava 10 mg 1 tablet by mouth daily x2 weeks and if labs are stable she will increase to 20 mg daily.  She was advised to notify us if she cannot tolerate taking Arava.  She will continue on Plaquenil as prescribed.  A prednisone taper starting at 20 mg tapering by 5 mg every 4 days was sent to the pharmacy.  We discussed the risks of long-term prednisone use.  She voiced understanding.  She will follow-up in the office in 6 to 8 weeks to assess her response to combination therapy.  Medication counseling:  Baseline Immunosuppressant Therapy Labs  Hepatitis Latest Ref Rng & Units 05/19/2017  Hep B Surface Ag NEGATIVE NEGATIVE  Hep B IgM NON REACTIVE NON REACTIVE  Hep C Ab NEGATIVE NEGATIVE  Hep A IgM NON REACTIVE NON REACTIVE    Lab Results  Component Value Date   HIV NONREACTIVE 05/19/2017    Immunoglobulin Electrophoresis Latest Ref Rng & Units 05/19/2017  IgG 694 - 1,618 mg/dL 4,034(V)  IgM 48 - 425 mg/dL 956    Serum Protein Electrophoresis Latest Ref Rng & Units 03/15/2021  Total Protein 6.1 - 8.1 g/dL 7.4  Albumin 3.8 - 4.8 g/dL -  Alpha-1 0.2 - 0.3 g/dL -  Alpha-2 0.5 - 0.9 g/dL -  Beta Globulin 0.4 - 0.6 g/dL -  Beta 2 0.2 - 0.5 g/dL -  Gamma Globulin 0.8 - 1.7 g/dL -  Interpretation - -    Lab Results  Component Value Date   G6PDH 18.6  05/19/2017   Patient was counseled on the purpose, proper use, and adverse effects of leflunomide including risk of infection, nausea/diarrhea/weight loss, increase in blood pressure, rash, hair loss, tingling in the hands and feet, and signs and symptoms of interstitial lung disease.   Also counseled on Black Box warning of liver injury and importance of avoiding alcohol while on therapy. Discussed that there is the possibility of an increased risk of malignancy but it is not well understood if this increased risk is due to the medication or the disease state.  Counseled patient to avoid live vaccines. Recommend annual influenza, Pneumovax 23, Prevnar 13, and Shingrix as indicated.   Discussed the importance of frequent monitoring of liver function and blood count.  Standing orders placed.  Discussed importance of birth control while on leflunomide due to risk of congenital abnormalities, and patient confirms she is postmenopausal.   Provided patient with educational materials on leflunomide and answered all questions.  Patient consented to [place] use, and consent will be uploaded into the media tab.   Patient dose will be 10 mg for 2 weeks and if labs are stable she will increase to 20 mg daily. Prescription pending lab results.  High risk medication use -She will be starting on Arava 10 mg 1 tablet by mouth daily x2 weeks and if labs are stable she will increase to 20 mg daily.  She will remain on Plaquenil 200 mg 1 tablet twice daily.  D/c MTX-fatigue, nausea, and brain fog. PLQ Eye Exam: 05/06/2021. CBC and CMP updated on 03/15/21.  CBC and CMP were drawn today.  Prescription for Arava pending lab results.  She will return for lab work in 2 weeks x2, 2 months, then every 3 months.  - Plan: CBC with Differential/Platelet, COMPLETE METABOLIC PANEL WITH GFR Advised to hold arava if she develops signs or symptoms of an infection and to  resume once the infection has completely cleared.    Primary  osteoarthritis of both hands: She has been experiencing increased pain and stiffness in both hands.    Primary osteoarthritis of both feet: She has tenderness to palpation over the left ankle joint.  Bunion over right 1st MTP is tender.    Primary osteoarthritis of left hip: She presents today with increased pain and stiffness in the left hip joint.  She has discomfort and slightly limited ROM on exam.  A prednisone taper was sent to the pharmacy.   Primary insomnia: She takes trazodone 150 mg at bedtime for insomnia.  She has been experiencing increased nocturnal pain which has been worsening her insomnia.   History of vitamin D deficiency: She has been taking vitamin D 2,000 units daily.   Osteoporosis screening - DEXA previously ordered by Dr. Corliss Skainseveshwar but has not been scheduled.   History of hypertension: BP was 128/61 today in the office. Discussed the importance of close blood pressure monitoring while taking arava.   Orders: Orders Placed This Encounter  Procedures  . CBC with Differential/Platelet  . COMPLETE METABOLIC PANEL WITH GFR   Meds ordered this encounter  Medications  . predniSONE (DELTASONE) 5 MG tablet    Sig: Take 4 tablets by mouth daily x4 days, 3 tablets by mouth daily x4 days, 2 tablets by mouth daily x4 days, 1 tablet by mouth daily x4 days.    Dispense:  40 tablet    Refill:  0     Follow-Up Instructions: Return in about 3 months (around 09/05/2021) for Rheumatoid arthritis.   Gearldine Bienenstockaylor M Octavie Westerhold, PA-C  Note - This record has been created using Dragon software.  Chart creation errors have been sought, but may not always  have been located. Such creation errors do not reflect on  the standard of medical care.

## 2021-06-05 ENCOUNTER — Other Ambulatory Visit: Payer: Self-pay

## 2021-06-05 ENCOUNTER — Ambulatory Visit: Payer: BC Managed Care – PPO | Admitting: Physician Assistant

## 2021-06-05 ENCOUNTER — Encounter: Payer: Self-pay | Admitting: Physician Assistant

## 2021-06-05 VITALS — BP 128/61 | HR 76 | Resp 15 | Ht 70.0 in | Wt 218.0 lb

## 2021-06-05 DIAGNOSIS — M19071 Primary osteoarthritis, right ankle and foot: Secondary | ICD-10-CM

## 2021-06-05 DIAGNOSIS — Z13820 Encounter for screening for osteoporosis: Secondary | ICD-10-CM

## 2021-06-05 DIAGNOSIS — M19041 Primary osteoarthritis, right hand: Secondary | ICD-10-CM | POA: Diagnosis not present

## 2021-06-05 DIAGNOSIS — M1612 Unilateral primary osteoarthritis, left hip: Secondary | ICD-10-CM

## 2021-06-05 DIAGNOSIS — Z79899 Other long term (current) drug therapy: Secondary | ICD-10-CM | POA: Diagnosis not present

## 2021-06-05 DIAGNOSIS — M19072 Primary osteoarthritis, left ankle and foot: Secondary | ICD-10-CM

## 2021-06-05 DIAGNOSIS — F5101 Primary insomnia: Secondary | ICD-10-CM

## 2021-06-05 DIAGNOSIS — M19042 Primary osteoarthritis, left hand: Secondary | ICD-10-CM

## 2021-06-05 DIAGNOSIS — Z8639 Personal history of other endocrine, nutritional and metabolic disease: Secondary | ICD-10-CM

## 2021-06-05 DIAGNOSIS — M0579 Rheumatoid arthritis with rheumatoid factor of multiple sites without organ or systems involvement: Secondary | ICD-10-CM

## 2021-06-05 DIAGNOSIS — Z8679 Personal history of other diseases of the circulatory system: Secondary | ICD-10-CM

## 2021-06-05 MED ORDER — PREDNISONE 5 MG PO TABS: ORAL_TABLET | ORAL | 0 refills | Status: DC

## 2021-06-05 NOTE — Patient Instructions (Signed)
Standing Labs We placed an order today for your standing lab work.   Please have your standing labs drawn in 2 weeks x2, 2 months, then every 3 months   If possible, please have your labs drawn 2 weeks prior to your appointment so that the provider can discuss your results at your appointment.  Please note that you may see your imaging and lab results in MyChart before we have reviewed them. We may be awaiting multiple results to interpret others before contacting you. Please allow our office up to 72 hours to thoroughly review all of the results before contacting the office for clarification of your results.  We have open lab daily: Monday through Thursday from 1:30-4:30 PM and Friday from 1:30-4:00 PM at the office of Dr. Shaili Deveshwar, [facility].   Please be advised, all patients with office appointments requiring lab work will take precedent over walk-in lab work.  If possible, please come for your lab work on Monday and Friday afternoons, as you may experience shorter wait times. The office is located at [address], [address], [place], [place] [address] No appointment is necessary.   Labs are drawn by [facility]. Please bring your co-pay at the time of your lab draw.  You may receive a bill from [facility] for your lab work.  If you wish to have your labs drawn at another location, please call the office 24 hours in advance to send orders.  If you have any questions regarding directions or hours of operation,  please call [phone].   As a reminder, please drink plenty of water prior to coming for your lab work. Thanks!   Leflunomide tablets What is this medicine? LEFLUNOMIDE (le FLOO na mide) is for rheumatoid arthritis. This medicine may be used for other purposes; ask your health care provider or pharmacist if you have questions. COMMON BRAND NAME(S): Arava What should I tell my health care provider before I take this medicine? They need to know if you have any  of these conditions:  diabetes  have a fever or infection  high blood pressure  immune system problems  kidney disease  liver disease  low blood cell counts, like low Goodreau cell, platelet, or red cell counts  lung or breathing disease, like asthma  recently received or scheduled to receive a vaccine  receiving treatment for cancer  skin conditions or sensitivity  tingling of the fingers or toes, or other nerve disorder  tuberculosis  an unusual or allergic reaction to leflunomide, teriflunomide, other medicines, food, dyes, or preservatives  pregnant or trying to get pregnant  breast-feeding How should I use this medicine? Take this medicine by mouth with a full glass of water. Follow the directions on the prescription label. Take your medicine at regular intervals. Do not take your medicine more often than directed. Do not stop taking except on your doctor's advice. Talk to your pediatrician regarding the use of this medicine in children. Special care may be needed. Overdosage: If you think you have taken too much of this medicine contact a poison control center or emergency room at once. NOTE: This medicine is only for you. Do not share this medicine with others. What if I miss a dose? If you miss a dose, take it as soon as you can. If it is almost time for your next dose, take only that dose. Do not take double or extra doses. What may interact with this medicine? Do not take this medicine with any of   the following medications:  teriflunomide This medicine may also interact with the following medications:  alosetron  birth control pills  caffeine  cefaclor  certain medicines for diabetes like nateglinide, repaglinide, rosiglitazone, pioglitazone  certain medicines for high cholesterol like atorvastatin, pravastatin, rosuvastatin, simvastatin  charcoal  cholestyramine  ciprofloxacin  duloxetine  furosemide  ketoprofen  live virus  vaccines  medicines that increase your risk for infection  methotrexate  mitoxantrone  paclitaxel  penicillin  theophylline  tizanidine  warfarin This list may not describe all possible interactions. Give your health care provider a list of all the medicines, herbs, non-prescription drugs, or dietary supplements you use. Also tell them if you smoke, drink alcohol, or use illegal drugs. Some items may interact with your medicine. What should I watch for while using this medicine? Visit your health care provider for regular checks on your progress. Tell your doctor or health care provider if your symptoms do not start to get better or if they get worse. You may need blood work done while you are taking this medicine. This medicine may cause serious skin reactions. They can happen weeks to months after starting the medicine. Contact your health care provider right away if you notice fevers or flu-like symptoms with a rash. The rash may be red or purple and then turn into blisters or peeling of the skin. Or, you might notice a red rash with swelling of the face, lips or lymph nodes in your neck or under your arms. This medicine may stay in your body for up to 2 years after your last dose. Tell your doctor about any unusual side effects or symptoms. A medicine can be given to help lower your blood levels of this medicine more quickly. Women must use effective birth control with this medicine. There is a potential for serious side effects to an unborn child. Do not become pregnant while taking this medicine. Inform your doctor if you wish to become pregnant. This medicine remains in your blood after you stop taking it. You must continue using effective birth control until the blood levels have been checked and they are low enough. A medicine can be given to help lower your blood levels of this medicine more quickly. Immediately talk to your doctor if you think you may be pregnant. You may need a  pregnancy test. Talk to your health care provider or pharmacist for more information. You should not receive certain vaccines during your treatment and for a certain time after your treatment with this medication ends. Talk to your health care provider for more information. What side effects may I notice from receiving this medicine? Side effects that you should report to your doctor or health care professional as soon as possible:  allergic reactions like skin rash, itching or hives, swelling of the face, lips, or tongue  breathing problems  cough  increased blood pressure  low blood counts - this medicine may decrease the number of Guilliams blood cells and platelets. You may be at increased risk for infections and bleeding.  pain, tingling, numbness in the hands or feet  rash, fever, and swollen lymph nodes  redness, blistering, peeing or loosening of the skin, including inside the mouth  signs of decreased platelets or bleeding - bruising, pinpoint red spots on the skin, black, tarry stools, blood in urine  signs of infection - fever or chills, cough, sore throat, pain or trouble passing urine  signs and symptoms of liver injury like dark yellow or brown   urine; general ill feeling or flu-like symptoms; light-colored stools; loss of appetite; nausea; right upper belly pain; unusually weak or tired; yellowing of the eyes or skin  trouble passing urine or change in the amount of urine  vomiting Side effects that usually do not require medical attention (report to your doctor or health care professional if they continue or are bothersome):  diarrhea  hair thinning or loss  headache  nausea  tiredness This list may not describe all possible side effects. Call your doctor for medical advice about side effects. You may report side effects to FDA at 1-800-FDA-1088. Where should I keep my medicine? Keep out of the reach of children. Store at room temperature between 15 and 30  degrees C (59 and 86 degrees F). Protect from moisture and light. Throw away any unused medicine after the expiration date. NOTE: This sheet is a summary. It may not cover all possible information. If you have questions about this medicine, talk to your doctor, pharmacist, or health care provider.  2021 Elsevier/Gold Standard (2019-03-18 15:06:48)  

## 2021-06-05 NOTE — Telephone Encounter (Signed)
Pending lab results, patient will be starting arava per Taylor Dale, PA-C. Thanks!   Consent obtained and sent to the scan center.  

## 2021-06-06 ENCOUNTER — Telehealth: Payer: Self-pay

## 2021-06-06 LAB — COMPLETE METABOLIC PANEL WITH GFR
AG Ratio: 1.3 (calc) (ref 1.0–2.5)
ALT: 41 U/L — ABNORMAL HIGH (ref 6–29)
AST: 28 U/L (ref 10–35)
Albumin: 4.3 g/dL (ref 3.6–5.1)
Alkaline phosphatase (APISO): 80 U/L (ref 37–153)
BUN: 11 mg/dL (ref 7–25)
CO2: 27 mmol/L (ref 20–32)
Calcium: 9.4 mg/dL (ref 8.6–10.4)
Chloride: 104 mmol/L (ref 98–110)
Creat: 0.96 mg/dL (ref 0.50–1.05)
GFR, Est African American: 78 mL/min/{1.73_m2} (ref >=60)
GFR, Est Non African American: 67 mL/min/{1.73_m2} (ref >=60)
Globulin: 3.4 g/dL (calc) (ref 1.9–3.7)
Glucose, Bld: 84 mg/dL (ref 65–99)
Potassium: 4.3 mmol/L (ref 3.5–5.3)
Sodium: 137 mmol/L (ref 135–146)
Total Bilirubin: 0.3 mg/dL (ref 0.2–1.2)
Total Protein: 7.7 g/dL (ref 6.1–8.1)

## 2021-06-06 LAB — CBC WITH DIFFERENTIAL/PLATELET
Absolute Monocytes: 761 cells/uL (ref 200–950)
Basophils Absolute: 52 cells/uL (ref 0–200)
Basophils Relative: 0.8 %
Eosinophils Absolute: 143 cells/uL (ref 15–500)
Eosinophils Relative: 2.2 %
HCT: 40.7 % (ref 35.0–45.0)
Hemoglobin: 13.1 g/dL (ref 11.7–15.5)
Lymphs Abs: 2646 cells/uL (ref 850–3900)
MCH: 28.4 pg (ref 27.0–33.0)
MCHC: 32.2 g/dL (ref 32.0–36.0)
MCV: 88.1 fL (ref 80.0–100.0)
MPV: 10.1 fL (ref 7.5–12.5)
Monocytes Relative: 11.7 %
Neutro Abs: 2899 cells/uL (ref 1500–7800)
Neutrophils Relative %: 44.6 %
Platelets: 275 10*3/uL (ref 140–400)
RBC: 4.62 10*6/uL (ref 3.80–5.10)
RDW: 12.5 % (ref 11.0–15.0)
Total Lymphocyte: 40.7 %
WBC: 6.5 10*3/uL (ref 3.8–10.8)

## 2021-06-06 MED ORDER — LEFLUNOMIDE 10 MG PO TABS: 10.0000 mg | ORAL_TABLET | Freq: Every day | ORAL | 0 refills | Status: DC

## 2021-06-06 NOTE — Telephone Encounter (Signed)
Patient called requesting a return call with her labwork results.  Patient states they called in a new medication Leflunomide and wanted to confirm her labwork before she started the medication.  Patient states you can leave the message on her answering machine.

## 2021-06-06 NOTE — Telephone Encounter (Signed)
Patient advised ALT remains borderline elevated-41. AST WNL.  Rest of CMP WNL.  CBC WNL.     Ok to start arava 10 mg 1 tablet by mouth daily. Patient advised prescription has been sent to the pharmacy. Patient expressed understanding.

## 2021-06-06 NOTE — Progress Notes (Signed)
ALT remains borderline elevated-41. AST WNL.  Rest of CMP WNL.  CBC WNL.    Ok to start arava 10 mg 1 tablet by mouth daily.

## 2021-06-06 NOTE — Telephone Encounter (Signed)
Per lab note ALT remains borderline elevated-41. AST WNL.  Rest of CMP WNL.  CBC WNL.     Ok to start arava 10 mg 1 tablet by mouth daily.

## 2021-06-07 DIAGNOSIS — Z1231 Encounter for screening mammogram for malignant neoplasm of breast: Secondary | ICD-10-CM | POA: Diagnosis not present

## 2021-06-26 ENCOUNTER — Other Ambulatory Visit: Payer: Self-pay | Admitting: Physician Assistant

## 2021-06-26 DIAGNOSIS — M0579 Rheumatoid arthritis with rheumatoid factor of multiple sites without organ or systems involvement: Secondary | ICD-10-CM

## 2021-07-02 ENCOUNTER — Other Ambulatory Visit: Payer: Self-pay | Admitting: Physician Assistant

## 2021-07-02 NOTE — Telephone Encounter (Signed)
Next Visit: 08/16/2021  Last Visit: 06/05/2021  Last Fill: 06/06/2021  DX: Seropositive rheumatoid arthritis of multiple sites  Current Dose per office note 06/05/2021: Arava 10 mg 1 tablet by mouth daily x2 weeks and if labs are stable she will increase to 20 mg daily.  Labs: 06/05/2021 ALT remains borderline elevated-41. AST WNL.  Rest of CMP WNL.  CBC WNL.  Patient advised she is due to update labs. Patient states she will come to the office tomorrow to update.    Okay to refill Arava?

## 2021-07-03 ENCOUNTER — Other Ambulatory Visit: Payer: Self-pay

## 2021-07-03 DIAGNOSIS — Z79899 Other long term (current) drug therapy: Secondary | ICD-10-CM

## 2021-07-04 LAB — CBC WITH DIFFERENTIAL/PLATELET
Absolute Monocytes: 594 cells/uL (ref 200–950)
Basophils Absolute: 58 cells/uL (ref 0–200)
Basophils Relative: 1.1 %
Eosinophils Absolute: 201 cells/uL (ref 15–500)
Eosinophils Relative: 3.8 %
HCT: 39.9 % (ref 35.0–45.0)
Hemoglobin: 13.2 g/dL (ref 11.7–15.5)
Lymphs Abs: 2645 cells/uL (ref 850–3900)
MCH: 29 pg (ref 27.0–33.0)
MCHC: 33.1 g/dL (ref 32.0–36.0)
MCV: 87.7 fL (ref 80.0–100.0)
MPV: 10.3 fL (ref 7.5–12.5)
Monocytes Relative: 11.2 %
Neutro Abs: 1802 cells/uL (ref 1500–7800)
Neutrophils Relative %: 34 %
Platelets: 244 10*3/uL (ref 140–400)
RBC: 4.55 10*6/uL (ref 3.80–5.10)
RDW: 12.7 % (ref 11.0–15.0)
Total Lymphocyte: 49.9 %
WBC: 5.3 10*3/uL (ref 3.8–10.8)

## 2021-07-04 LAB — COMPLETE METABOLIC PANEL WITH GFR
AG Ratio: 1.2 (calc) (ref 1.0–2.5)
ALT: 40 U/L — ABNORMAL HIGH (ref 6–29)
AST: 27 U/L (ref 10–35)
Albumin: 4 g/dL (ref 3.6–5.1)
Alkaline phosphatase (APISO): 69 U/L (ref 37–153)
BUN: 11 mg/dL (ref 7–25)
CO2: 27 mmol/L (ref 20–32)
Calcium: 9.5 mg/dL (ref 8.6–10.4)
Chloride: 104 mmol/L (ref 98–110)
Creat: 0.92 mg/dL (ref 0.50–1.05)
GFR, Est African American: 82 mL/min/{1.73_m2} (ref >=60)
GFR, Est Non African American: 71 mL/min/{1.73_m2} (ref >=60)
Globulin: 3.4 g/dL (calc) (ref 1.9–3.7)
Glucose, Bld: 91 mg/dL (ref 65–99)
Potassium: 4.1 mmol/L (ref 3.5–5.3)
Sodium: 137 mmol/L (ref 135–146)
Total Bilirubin: 0.3 mg/dL (ref 0.2–1.2)
Total Protein: 7.4 g/dL (ref 6.1–8.1)

## 2021-07-04 NOTE — Progress Notes (Signed)
CBC WNL.  ALT remains slightly elevated-40 but has not worsened since adding arava. AST WNL.   Rest of CMP WNL. Please advise the patient to continue on arava 10 mg 1 tablet daily. Recheck lab work in 1 month.

## 2021-07-31 ENCOUNTER — Other Ambulatory Visit: Payer: Self-pay | Admitting: Physician Assistant

## 2021-07-31 NOTE — Telephone Encounter (Signed)
Next Visit: 08/16/2021   Last Visit: 06/05/2021   Last Fill: 06/06/2021   DX: Seropositive rheumatoid arthritis of multiple sites   Current Dose per office note 06/05/2021: Arava 10 mg 1 tablet by mouth daily x2 weeks and if labs are stable she will increase to 20 mg daily.   Labs: 07/03/2021 CBC WNL.  ALT remains slightly elevated-40 but has not worsened since adding arava. AST WNL.   Rest of CMP WNL. Please advise the patient to continue onarava 10 mg 1 tablet daily.  Okay to refill Arava?

## 2021-08-02 NOTE — Progress Notes (Signed)
Office Visit Note  Patient: Shelley Morrison             Date of Birth: 07-25-1966           MRN: [id number]             PCP: Gilda Crease, MD Referring: Gilda Crease, MD Visit Date: 08/16/2021 Occupation: @GUAROCC @  Subjective:  Pain in hands and feet   History of Present Illness: ERMAL BRZOZOWSKI is a 55 y.o. female with a history of seropositive rheumatoid arthritis and osteoarthritis.  She states she has been taking leflunomide 10 mg p.o. daily.  She has not noticed any improvement in her symptoms.  She continues to have frequent flares of rheumatoid arthritis.  She has constant discomfort in her hands.  She has intermittent discomfort in her feet.  She also has discomfort in her hip joints due to underlying osteoarthritis.  She is also noticed hair loss on leflunomide.  Activities of Daily Living:  Patient reports morning stiffness for all day. Patient Denies nocturnal pain.  Difficulty dressing/grooming: Denies Difficulty climbing stairs: Denies Difficulty getting out of chair: Denies Difficulty using hands for taps, buttons, cutlery, and/or writing: Denies  Review of Systems  Constitutional:  Negative for fatigue.  HENT:  Positive for nose dryness. Negative for mouth sores and mouth dryness.   Eyes:  Positive for dryness. Negative for pain and itching.  Respiratory:  Negative for shortness of breath and difficulty breathing.   Cardiovascular:  Negative for chest pain and palpitations.  Gastrointestinal:  Negative for blood in stool, constipation and diarrhea.  Endocrine: Negative for increased urination.  Genitourinary:  Negative for difficulty urinating.  Musculoskeletal:  Positive for joint pain, joint pain, joint swelling and morning stiffness. Negative for myalgias, muscle tenderness and myalgias.  Skin:  Negative for color change, rash and redness.  Allergic/Immunologic: Negative for susceptible to infections.  Neurological:  Negative for dizziness, numbness,  headaches and weakness.  Hematological:  Negative for bruising/bleeding tendency.  Psychiatric/Behavioral:  Negative for confusion.    PMFS History:  Patient Active Problem List   Diagnosis Date Noted   Rheumatoid arthritis with rheumatoid factor of multiple sites without organ or systems involvement (HCC) 06/19/2017   High risk medication use 06/18/2017   Vitamin D deficiency 06/18/2017   Primary insomnia 05/19/2017   Essential hypertension 05/19/2017   ANA positive 05/08/2017   Rheumatoid factor positive 05/08/2017    Past Medical History:  Diagnosis Date   Hypertension    Lupus (HCC)    Rheumatoid arthritis (HCC)     Family History  Problem Relation Age of Onset   Healthy Son    Healthy Daughter    Past Surgical History:  Procedure Laterality Date   CHOLECYSTECTOMY     Social History   Social History Narrative   Not on file   Immunization History  Administered Date(s) Administered   PFIZER(Purple Top)SARS-COV-2 Vaccination 09/24/2020, 10/15/2020     Objective: Vital Signs: BP (!) 159/91 (BP Location: Left Arm, Patient Position: Sitting, Cuff Size: Large)   Pulse 70   Ht 5\' 10"  (1.778 m)   Wt 217 lb (98.4 kg)   BMI 31.14 kg/m    Physical Exam Vitals and nursing note reviewed.  Constitutional:      Appearance: She is well-developed.  HENT:     Head: Normocephalic and atraumatic.  Eyes:     Conjunctiva/sclera: Conjunctivae normal.  Cardiovascular:     Rate and Rhythm: Normal rate and regular rhythm.  Heart sounds: Normal heart sounds.  Pulmonary:     Effort: Pulmonary effort is normal.     Breath sounds: Normal breath sounds.  Abdominal:     General: Bowel sounds are normal.     Palpations: Abdomen is soft.  Musculoskeletal:     Cervical back: Normal range of motion.  Lymphadenopathy:     Cervical: No cervical adenopathy.  Skin:    General: Skin is warm and dry.     Capillary Refill: Capillary refill takes less than 2 seconds.  Neurological:      Mental Status: She is alert and oriented to person, place, and time.  Psychiatric:        Behavior: Behavior normal.     Musculoskeletal Exam: C-spine was in good range of motion.  Shoulder joints, elbow joints, wrist joints, MCPs PIPs and DIPs with good range of motion.  She had bilateral PIP and DIP thickening.  She had tenderness on palpation over right second and third MCP joints.  She some discomfort range of motion of her hip joints.  There is no warmth swelling or effusion in her knee joints.  She had tenderness over MTPs but no synovitis was noted.  CDAI Exam: CDAI Score: 2.8  Patient Global: 5 mm; Provider Global: 3 mm Swollen: 0 ; Tender: 5  Joint Exam 08/16/2021      Right  Left  MCP 2   Tender     MCP 3   Tender     MTP 2   Tender     MTP 5   Tender   Tender     Investigation: No additional findings.  Imaging: XR Foot 2 Views Left  Result Date: 08/16/2021 First MTP, PIP and DIP narrowing was noted.  No intertarsal, tibiotalar or subtalar narrowing was noted.  Inferior calcaneal spur was noted.  No erosive changes were noted.  No radiographic progression was noted when compared to the x-rays of 2020. Impression: These findings are consistent with osteoarthritis of the foot.  XR Foot 2 Views Right  Result Date: 08/16/2021 First MTP, PIP and DIP narrowing was noted. A callus was noted in the fourth metatarsal shaft.  Dorsal spurring was noted.  No tibiotalar or subtalar narrowing was noted.  Inferior and posterior calcaneal spurs were noted.  No erosive changes were noted.  No radiographic progression was noted when compared to the x-rays of 2020. Impression: These findings are consistent with osteoarthritis of the foot.  XR Hand 2 View Left  Result Date: 08/16/2021 CMC, PIP and DIP narrowing was noted.  Juxta-articular osteopenia was noted.  Mild second and third MCP narrowing was noted.  No intercarpal or radiocarpal joint space narrowing was noted..  No  radiographic progression was noted when compared to the x-rays of 2020. 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  XR Hand 2 View Right  Result Date: 08/16/2021 Juxta-articular osteopenia was noted.  CMC, PIP and DIP narrowing was noted.  Mild second and third MCP joint narrowing was noted.  No erosive changes were noted.  No intercarpal or radiocarpal joint space narrowing was noted.  No radiographic progression was noted when compared to the x-rays of 2020. 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   Recent Labs: Lab Results  Component Value Date   WBC 5.3 07/03/2021   HGB 13.2 07/03/2021   PLT 244 07/03/2021   NA 137 07/03/2021   K 4.1 07/03/2021   CL 104 07/03/2021  CO2 27 07/03/2021   GLUCOSE 91 07/03/2021   BUN 11 07/03/2021   CREATININE 0.92 07/03/2021   BILITOT 0.3 07/03/2021   ALKPHOS 45 08/24/2017   AST 27 07/03/2021   ALT 40 (H) 07/03/2021   PROT 7.4 07/03/2021   ALBUMIN 3.7 08/24/2017   CALCIUM 9.5 07/03/2021   GFRAA 82 07/03/2021    Speciality Comments: PLQ Eye Exam: 05/06/2021 WNL @ [facility] Follow up in 6 months, methotrexate-fatigue nausea and brain fog Arava-hair loss, inadequate response   Procedures:  No procedures performed Allergies: Acetaminophen-codeine   Assessment / Plan:     Visit Diagnoses: Seropositive rheumatoid arthritis of multiple sites (HCC)-positive RF, positive anti-CCP positive ANA.  She initially presented with aggressive rheumatoid arthritis involving multiple joints.  She has been on Plaquenil and methotrexate combination for a while and then was switched to [place] due to side effects from methotrexate.  Arava dose could not be increased due to elevated LFTs.  She continues to have pain and discomfort in multiple joints and intermittent swelling.  She is also experiencing hair loss on Arava.  She had tenderness on my  examination but not with synovitis.  I had detailed discussion regarding different treatment options and their side effects.  After reviewing all the medications she wants to proceed with Enbrel.  Indications side effects contraindications were discussed.  A handout was given and consent was taken.  We will apply for Enbrel.  Once approved we will start her on Enbrel 50 mg subcu weekly.  We will discontinue leflunomide once she starts on Enbrel. Medication counseling:   TB Test: Pending Hepatitis panel: May 19, 2017 HIV: May 19, 2017 SPEP: May 19, 2017 Immunoglobulins: May 19, 2017  Chest x-ray: May 27, 2017  Does patient have diagnosis of heart failure?  No  Counseled patient that Enbrel is a TNF blocking agent.  Reviewed Enbrel dose of 50 mg once weekly.  Counseled patient on purpose, proper use, and adverse effects of Enbrel.  Reviewed the most common adverse effects including infections, headache, and injection site reactions. Discussed that there is the possibility of an increased risk of malignancy but it is not well understood if this increased risk is due to the medication or the disease state.  Advised patient to get yearly dermatology exams due to risk of skin cancer.  Reviewed the importance of regular labs while on Enbrel therapy.  Advised patient to get standing labs one month after starting Enbrel then every 2 months.  Provided patient with standing lab orders.  Counseled patient that Enbrel should be held prior to scheduled surgery.  Counseled patient to avoid live vaccines while on Enbrel.  Advised patient to get annual influenza vaccine and the pneumococcal vaccine as needed.  Provided patient with medication education material and answered all questions.  Patient voiced understanding.  Patient consented to Enbrel.  Will upload consent into the media tab.  Reviewed storage instructions for Enbrel.  Advised initial injection must be administered in office.  Patient voiced understanding.      High risk medication use - Started arava at last visit on 06/05/21.  PLQ 200 mg BID.D/c MTX-fatigue, nausea, and brain fog. PLQ Eye Exam: 05/06/2021.  - Plan: QuantiFERON-TB Gold Plus  Pain in both hands -she complains of frequent flares.  She had prednisone taper recently.  She continues to have discomfort in her hands and tenderness on my examination today.  No synovitis was noted.  Plan: XR Hand 2 View Right, XR Hand 2  View Left.  X-rays were consistent with rheumatoid arthritis and osteoarthritis overlap.  No radiographic progression was noted when compared to the x-rays of 2020.  X-ray findings were discussed with the patient.  Primary osteoarthritis of both hands-she also has underlying osteoarthritis with DIP and PIP thickening.  Pain in both feet - Plan: XR Foot 2 Views Right, XR Foot 2 Views Left.  X-rays were consistent with osteoarthritis.  No radiographic progression was noted when compared to the x-rays of 2020.  Primary osteoarthritis of both feet-proper fitting shoes were discussed.  Primary osteoarthritis of left hip-she has some discomfort with range of motion.  History of vitamin D deficiency-she is on vitamin D 2000 units daily.  Primary insomnia  History of hypertension-blood pressure is elevated.  She has been advised to monitor blood pressure closely.  A handout on dietary modifications and exercise was  placed in the AVS.  Orders: Orders Placed This Encounter  Procedures   XR Hand 2 View Right   XR Hand 2 View Left   XR Foot 2 Views Right   XR Foot 2 Views Left   QuantiFERON-TB Gold Plus    No orders of the defined types were placed in this encounter.    Follow-Up Instructions: Return in about 6 weeks (around 09/27/2021) for Rheumatoid arthritis.   Pollyann Savoy, MD  Note - This record has been created using Animal nutritionist.  Chart creation errors have been sought, but may not always  have been located. Such creation errors do not reflect on  the  standard of medical care.

## 2021-08-16 ENCOUNTER — Ambulatory Visit: Payer: Self-pay

## 2021-08-16 ENCOUNTER — Ambulatory Visit: Payer: BC Managed Care – PPO | Admitting: Rheumatology

## 2021-08-16 ENCOUNTER — Telehealth: Payer: Self-pay

## 2021-08-16 ENCOUNTER — Encounter: Payer: Self-pay | Admitting: Rheumatology

## 2021-08-16 ENCOUNTER — Other Ambulatory Visit: Payer: Self-pay

## 2021-08-16 VITALS — BP 159/91 | HR 70 | Ht 70.0 in | Wt 217.0 lb

## 2021-08-16 DIAGNOSIS — M19042 Primary osteoarthritis, left hand: Secondary | ICD-10-CM

## 2021-08-16 DIAGNOSIS — M1612 Unilateral primary osteoarthritis, left hip: Secondary | ICD-10-CM

## 2021-08-16 DIAGNOSIS — Z8679 Personal history of other diseases of the circulatory system: Secondary | ICD-10-CM

## 2021-08-16 DIAGNOSIS — M79672 Pain in left foot: Secondary | ICD-10-CM | POA: Diagnosis not present

## 2021-08-16 DIAGNOSIS — Z8639 Personal history of other endocrine, nutritional and metabolic disease: Secondary | ICD-10-CM

## 2021-08-16 DIAGNOSIS — M79641 Pain in right hand: Secondary | ICD-10-CM | POA: Diagnosis not present

## 2021-08-16 DIAGNOSIS — M79671 Pain in right foot: Secondary | ICD-10-CM

## 2021-08-16 DIAGNOSIS — M79642 Pain in left hand: Secondary | ICD-10-CM

## 2021-08-16 DIAGNOSIS — M19041 Primary osteoarthritis, right hand: Secondary | ICD-10-CM | POA: Diagnosis not present

## 2021-08-16 DIAGNOSIS — F5101 Primary insomnia: Secondary | ICD-10-CM

## 2021-08-16 DIAGNOSIS — M0579 Rheumatoid arthritis with rheumatoid factor of multiple sites without organ or systems involvement: Secondary | ICD-10-CM | POA: Diagnosis not present

## 2021-08-16 DIAGNOSIS — M19071 Primary osteoarthritis, right ankle and foot: Secondary | ICD-10-CM

## 2021-08-16 DIAGNOSIS — Z79899 Other long term (current) drug therapy: Secondary | ICD-10-CM

## 2021-08-16 DIAGNOSIS — Z111 Encounter for screening for respiratory tuberculosis: Secondary | ICD-10-CM | POA: Diagnosis not present

## 2021-08-16 DIAGNOSIS — M19072 Primary osteoarthritis, left ankle and foot: Secondary | ICD-10-CM

## 2021-08-16 NOTE — Telephone Encounter (Signed)
Please apply for enbrel, per Dr. Corliss Skains. Thanks!   Consent obtained and sent to the scan center.

## 2021-08-16 NOTE — Patient Instructions (Addendum)
Shelley Morrison We placed an order today for your Shelley lab work.   Please have your Shelley Morrison drawn in 1 month and then every 3 months   If possible, please have your Morrison drawn 2 weeks prior to your appointment so that the provider can discuss your results at your appointment.  Please note that you may see your imaging and lab results in MyChart before we have reviewed them. We may be awaiting multiple results to interpret others before contacting you. Please allow our office up to 72 hours to thoroughly review all of the results before contacting the office for clarification of your results.  We have open lab daily: Monday through Thursday from 1:30-4:30 PM and Friday from 1:30-4:00 PM at the office of Dr. Pollyann Savoy, [facility].   Please be advised, all patients with office appointments requiring lab work will take precedent over walk-in lab work.  If possible, please come for your lab work on Monday and Friday afternoons, as you may experience shorter wait times. The office is located at [address], [address], [place], [place] [address] No appointment is necessary.   Morrison are drawn by [facility]. Please bring your co-pay at the time of your lab draw.  You may receive a bill from [facility] for your lab work.  If you wish to have your Morrison drawn at another location, please call the office 24 hours in advance to send orders.  If you have any questions regarding directions or hours of operation,  please call [phone].   As a reminder, please drink plenty of water prior to coming for your lab work. Thanks!  Etanercept Injection What is this medication? ETANERCEPT (et a [employer]) is used for the treatment of rheumatoid arthritis. The medicine is also used to treat psoriatic arthritis, ankylosing spondylitis,and psoriasis. This medicine may be used for other purposes; ask your health care provider orpharmacist if you have questions. COMMON BRAND NAME(S):  Enbrel What should I tell my care team before I take this medication? They need to know if you have any of these conditions: bleeding disorder cancer diabetes granulomatosis with polyangiitis heart failure HIV or AIDs immune system problems infection such as tuberculosis (TB) or other bacterial, fungal or viral infections liver disease nervous system problems such as Guillain-Barre syndrome, multiple sclerosis or seizures recent or upcoming vaccine an unusual or allergic reaction to etanercept, other medicines, latex, rubber, food, dyes, or preservatives pregnant or trying to get pregnant breast-feeding How should I use this medication? The medicine is injected under the skin. You will be taught how to prepare and give it. Take it as directed on the prescription label. Keep taking it unlessyour health care provider tells you stop. This medicine comes with INSTRUCTIONS FOR USE. Ask your pharmacist for directions on how to use this medicine. Read the information carefully. Talk toyour pharmacist or health care provider if you have questions. If you use a pen, be sure to take off the outer needle cover before using thedose. It is important that you put your used needles and syringes in a special sharps container. Do not put them in a trash can. If you do not have a sharpscontainer, call your pharmacist or health care provider to get one. A special MedGuide will be given to you by the pharmacist with eachprescription and refill. Be sure to read this information carefully each time. Talk to your health care provider about the use of this medicine in children. While it may be prescribed  for children as young as 70 years of age for selectedconditions, precautions do apply. Overdosage: If you think you have taken too much of this medicine contact apoison control center or emergency room at once. NOTE: This medicine is only for you. Do not share this medicine with others. What if I miss a dose? If  you miss a dose, take it as soon as you can. If it is almost time for yournext dose, take only that dose. Do not take double or extra doses. What may interact with this medication? Do not take this medicine with any of the following medications: biologic medicines such as adalimumab, certolizumab, golimumab, infliximab live vaccines rilonacept This medicine may also interact with the following medications: abatacept anakinra biologic medicines such as anifrolumab, baricitinib, belimumab, canakinumab, natalizumab, rituximab, sarilumab, tocilizumab, tofacitinib, upadacitinib, vedolizumab cyclophosphamide sulfasalazine This list may not describe all possible interactions. Give your health care provider a list of all the medicines, herbs, non-prescription drugs, or dietary supplements you use. Also tell them if you smoke, drink alcohol, or use illegaldrugs. Some items may interact with your medicine. What should I watch for while using this medication? Visit your health care provider for regular checks on your progress. Tell your health care provider if your symptoms do not start to get better or if they getworse. This medicine may increase your risk of getting an infection. Call your health care provider for advice if you get a fever, chills, sore throat, or other symptoms of a cold or flu. Do not treat yourself. Try to avoid being around people who are sick. If you have not had the measles or chickenpox vaccines, tell your health care provider right away if you are around someone with theseviruses. You will be tested for tuberculosis (TB) before you start this medicine. If your doctor prescribes any medicine for TB, you should start taking the TB medicine before starting this medicine. Make sure to finish the full course ofTB medicine. Avoid taking medicines that contain aspirin, acetaminophen, ibuprofen, naproxen, or ketoprofen unless instructed by your health care provider. Thesemedicines may hide  fever. Talk to your health care provider about your risk of cancer. You may be more atrisk for certain types of cancer if you take this medicine. This medicine can decrease the response to a vaccine. If you need to get vaccinated, tell your health care provider if you have received this medicine. Extra booster doses may be needed. Talk to your health care provider to see ifa different vaccination schedule is needed. What side effects may I notice from receiving this medication? Side effects that you should report to your health care provider as soon aspossible: allergic reactions (skin rash, itching or hives; swelling of the face, lips, or tongue) changes in vision dizziness heart failure (trouble breathing; fast, irregular heartbeat; sudden weight gain; swelling of the ankles, feet, hands; unusually weak or tired) infection (fever, chills, cough, sore throat, pain or trouble passing urine) light-colored stools liver injury (dark yellow or brown urine; general ill feeling or flu-like symptoms; loss of appetite, right upper belly pain; unusually weak or tired, yellowing of the eyes or skin) low red blood cell counts (trouble breathing; feeling faint; lightheaded, falls; unusually weak or tired) pain, tingling, numbness in the hands or feet red, scaly patches or raised bumps on the skin seizures unusual bruising or bleeding weakness in the arms or legs Side effects that usually do not require medical attention (report to yourhealth care provider if they continue or are bothersome): headache  nausea pain, redness, or irritation at site where injected vomiting This list may not describe all possible side effects. Call your doctor for medical advice about side effects. You may report side effects to FDA at1-800-FDA-1088. Where should I keep my medication? Keep out of the reach of children and pets. See product for storage information. Each product may have differentinstructions. Get rid of any  unused medicine after the expiration date. To get rid of medicines that are no longer needed or have expired: Take the medicine to a medicine take-back program. Check with your pharmacy or law enforcement to find a location. If you cannot return the medicine, ask your pharmacist or health care provider how to get rid of this medicine safely. NOTE: This sheet is a summary. It may not cover all possible information. If you have questions about this medicine, talk to your doctor, pharmacist, orhealth care provider.  2022 Elsevier/Gold Standard (2020-09-28 12:59:29)  Vaccines You are taking a medication(s) that can suppress your immune system.  The following immunizations are recommended: Flu annually Covid-19  Td/Tdap (tetanus, diphtheria, pertussis) every 10 years Pneumonia (Prevnar 15 then Pneumovax 23 at least 1 year apart.  Alternatively, can take Prevnar 20 without needing additional dose) Shingrix (after age 55): 2 doses from 4 weeks to 6 months apart  Please check with your PCP to make sure you are up to date.   If you test POSITIVE for COVID19 and have MILD to MODERATE symptoms: First, call your PCP if you would like to receive COVID19 treatment AND Hold your medications during the infection and for at least 1 week after your symptoms have resolved: Injectable medication (Benlysta, Cimzia, Cosentyx, Enbrel, Humira, Orencia, Remicade, Simponi, Stelara, Taltz, Tremfya) Methotrexate Leflunomide (Arava) Azathioprine Mycophenolate (Cellcept) Osborne OmanXeljanz, Olumiant, or Rinvoq Otezla If you take Actemra or Kevzara, you DO NOT need to hold these for COVID19 infection.  If you test POSITIVE for COVID19 and have NO symptoms: First, call your PCP if you would like to receive COVID19 treatment AND Hold your medications for at least 10 days after the day that you tested positive Injectable medication (Benlysta, Cimzia, Cosentyx, Enbrel, Humira, Orencia, Remicade, Simponi, Stelara, Taltz,  Tremfya) Methotrexate Leflunomide (Arava) Azathioprine Mycophenolate (Cellcept) Osborne OmanXeljanz, Olumiant, or Rinvoq Otezla If you take Actemra or Kevzara, you DO NOT need to hold these for COVID19 infection.  If you have signs or symptoms of an infection or start antibiotics: First, call your PCP for workup of your infection. Hold your medication through the infection, until you complete your antibiotics, and until symptoms resolve if you take the following: Injectable medication (Actemra, Benlysta, Cimzia, Cosentyx, Enbrel, Humira, Kevzara, Orencia, Remicade, Simponi, Stelara, Taltz, Tremfya) Methotrexate Leflunomide (Arava) Mycophenolate (Cellcept) Harriette OharaXeljanz, Olumiant, or Rinvoq  Please get an annual skin examination to screen for nonmelanoma skin cancer by a dermatologist while you are on Enbrel.  Heart Disease Prevention   Your inflammatory disease increases your risk of heart disease which includes heart attack, stroke, atrial fibrillation (irregular heartbeats), high blood pressure, heart failure and atherosclerosis (plaque in the arteries).  It is important to reduce your risk by:   Keep blood pressure, cholesterol, and blood sugar at healthy levels   Smoking Cessation   Maintain a healthy weight  BMI 20-25   Eat a healthy diet  Plenty of fresh fruit, vegetables, and whole grains  Limit saturated fats, foods high in sodium, and added sugars  DASH and Mediterranean diet   Increase physical activity  Recommend moderate physically activity for 150  minutes per week/ 30 minutes a day for five days a week These can be broken up into three separate ten-minute sessions during the day.   Reduce Stress  Meditation, slow breathing exercises, yoga, coloring books  Dental visits twice a year

## 2021-08-16 NOTE — Telephone Encounter (Signed)
Initiated a Prior Authorization request to [employer] for ENBREL via CoverMyMeds.   Awaiting TB test results to submit.   Key: [id number]

## 2021-08-21 LAB — QUANTIFERON-TB GOLD PLUS
Mitogen-NIL: >10 IU/mL
NIL: 0.04 IU/mL
QuantiFERON-TB Gold Plus: NEGATIVE
TB1-NIL: 0.01 IU/mL
TB2-NIL: 0 IU/mL

## 2021-08-21 NOTE — Telephone Encounter (Addendum)
TB gold resulted as negative.  PA submitted to [facility] for ENBREL via CoverMyMeds.  Key: [id number] (error occurred when submitting) Renewed key: [id number] (no error when submitting)  Chesley Mires, PharmD, MPH, BCPS Clinical Pharmacist (Rheumatology and Pulmonology)

## 2021-08-23 ENCOUNTER — Other Ambulatory Visit (HOSPITAL_COMMUNITY): Payer: Self-pay

## 2021-08-23 NOTE — Telephone Encounter (Signed)
Received notification from [facility] regarding a prior authorization for ENBREL. Authorization has been APPROVED from 08/21/21 to 08/20/22.   Unable to run test claim since patient is out-of-network for [facility].  Patient must fill through [facility]: [phone]  Patient eligible for and enrolled into savings program. Debit card expected to arrive in her home in 2-3 business days  Chesley Mires, PharmD, MPH, BCPS Clinical Pharmacist (Rheumatology and Pulmonology)

## 2021-08-26 ENCOUNTER — Telehealth: Payer: Self-pay

## 2021-08-26 NOTE — Telephone Encounter (Signed)
ATC patient to schedule Enbrel new start. Unable to reach. Left VM requesting return call to schedule.  Will route to scheduling team as Katheren Shams, PharmD, MPH, BCPS Clinical Pharmacist (Rheumatology and Pulmonology)

## 2021-08-26 NOTE — Telephone Encounter (Signed)
Patient states she forgot to ask if she can still take her Plaquenil and Leflunomide medications.  Patient states you can leave a message on her voicemail.

## 2021-08-26 NOTE — Telephone Encounter (Signed)
Patient returned call and is scheduled for Enbrel new start visit on 08/29/21. She is aware that the Enbrel savings card should be arriving to her home likely prior to this appt  Chesley Mires, PharmD, MPH, BCPS Clinical Pharmacist (Rheumatology and Pulmonology)

## 2021-08-26 NOTE — Telephone Encounter (Signed)
Patient advised that plan is to stop leflunomide (last dose will be Wednesday, 08/28/21) and continue hydroxychloroquine as prescribed. She verbalized understanding  Chesley Mires, PharmD, MPH, BCPS Clinical Pharmacist (Rheumatology and Pulmonology)

## 2021-08-28 NOTE — Progress Notes (Signed)
Pharmacy Note  Subjective:   Shelley Morrison presents to clinic today to receive first dose of Enbrel Mini for rheumatoid arthritis. She is accompanied by her sister  She is naive to biologics. She has tried methotrexate in the past which caused fatigue, brain fog, and nausea.  She currently takes leflunomide and hydroxychloroquine. Leflunomide is causing hair loss and inadequate clinical response - she did not take leflunomide today.  Patient running a fever or have signs/symptoms of infection? No  Patient currently on antibiotics for the treatment of infection? No  Patient have any upcoming invasive procedures/surgeries? No  Objective: CMP     Component Value Date/Time   NA 137 07/03/2021 1538   K 4.1 07/03/2021 1538   CL 104 07/03/2021 1538   CO2 27 07/03/2021 1538   GLUCOSE 91 07/03/2021 1538   BUN 11 07/03/2021 1538   CREATININE 0.92 07/03/2021 1538   CALCIUM 9.5 07/03/2021 1538   PROT 7.4 07/03/2021 1538   ALBUMIN 3.7 08/24/2017 1046   AST 27 07/03/2021 1538   ALT 40 (H) 07/03/2021 1538   ALKPHOS 45 08/24/2017 1046   BILITOT 0.3 07/03/2021 1538   GFRNONAA 71 07/03/2021 1538   GFRAA 82 07/03/2021 1538    CBC    Component Value Date/Time   WBC 5.3 07/03/2021 1538   RBC 4.55 07/03/2021 1538   HGB 13.2 07/03/2021 1538   HCT 39.9 07/03/2021 1538   PLT 244 07/03/2021 1538   MCV 87.7 07/03/2021 1538   MCH 29.0 07/03/2021 1538   MCHC 33.1 07/03/2021 1538   RDW 12.7 07/03/2021 1538   LYMPHSABS 2,645 07/03/2021 1538   MONOABS 450 08/24/2017 1046   EOSABS 201 07/03/2021 1538   BASOSABS 58 07/03/2021 1538    Baseline Immunosuppressant Therapy Labs TB GOLD Quantiferon TB Gold Latest Ref Rng & Units 08/16/2021  Quantiferon TB Gold Plus NEGATIVE NEGATIVE   Hepatitis Panel Hepatitis Latest Ref Rng & Units 05/19/2017  Hep B Surface Ag NEGATIVE NEGATIVE  Hep B IgM NON REACTIVE NON REACTIVE  Hep C Ab NEGATIVE NEGATIVE  Hep A IgM NON REACTIVE NON REACTIVE   HIV Lab Results   Component Value Date   HIV NONREACTIVE 05/19/2017   Immunoglobulins Immunoglobulin Electrophoresis Latest Ref Rng & Units 05/19/2017  IgG 694 - 1,618 mg/dL 3,614(E)  IgM 48 - 315 mg/dL 400   SPEP Serum Protein Electrophoresis Latest Ref Rng & Units 07/03/2021  Total Protein 6.1 - 8.1 g/dL 7.4  Albumin 3.8 - 4.8 g/dL -  Alpha-1 0.2 - 0.3 g/dL -  Alpha-2 0.5 - 0.9 g/dL -  Beta Globulin 0.4 - 0.6 g/dL -  Beta 2 0.2 - 0.5 g/dL -  Gamma Globulin 0.8 - 1.7 g/dL -  Interpretation - -   G6PD Lab Results  Component Value Date   G6PDH 18.6 05/19/2017   Chest x-ray: 05/27/17 - no active cardiopulmonary disease  Assessment/Plan:  Demonstrated proper injection technique with Enbrel demo device  Patient able to demonstrate proper injection technique using the teach back method.  Patient self injected in the right lower abdomen with:  Sample Medication: Enbrel Mini prefilled cartridge NDC: [address]-32 Lot: [id number] Expiration: 10/2023  Medication: Enbrel Autotouch Connect Injector Device NDC: [address]-05 Lot: [id number] Expiration: 12/28/2024  Patient tolerated well.  Observed for 30 mins in office for adverse reaction and none noted.   Patient is to return in 1 month for labs and 6-8 weeks for follow-up appointment.  Standing orders placed for CBC w diff and CMP w GFR.  She will continue Enbrel 50mg  once weekly. She will stop leflunomide (removed from med list today). She will continue hydroxychloroquine 200 mg twice daily as prescribed.  Enbrel approved through insurance .   Rx sent to: [facility]: [id number].  She was enrolled into Enbrel savings card and she is expected to receive card today or tomorrow in mail. Patient provided with pharmacy phone number and advised to call later this week to schedule shipment to home only after she receives debit card.  Reviewed that she should hold Enbrel for signs/symptoms of an infection,   All questions encouraged and  answered.  Instructed patient to call with any further questions or concerns.  [phone], PharmD, MPH, BCPS Clinical Pharmacist (Rheumatology and Pulmonology)  08/28/2021 8:05 AM

## 2021-08-28 NOTE — Patient Instructions (Addendum)
Your next Enbrel dose is due on 9/8, 9/15, and every 7 days thereafter  CONTINUE hydroxychloroquine as prescribed.  STOP leflunomide Shelley Morrison)  Your prescription will be shipped from [facility]. Their phone number is [phone] Please call to schedule shipment and confirm address after you've receivedyour. They will mail your medication to your home.  Please provide that pharmacy with your Enbrel debit card information for billing.  Labs are due in 1 month then every 3 months. Lab hours are from Monday to Thursday 1:30-4:30pm and Friday 1:30-4pm. You do not need an appointment if you come for labs during these times.  How to manage an injection site reaction: Remember the 5 C's: COUNTER - leave on the counter at least 30 minutes but up to overnight to bring medication to room temperature. This may help prevent stinging COLD - place something cold (like an ice gel pack or cold water bottle) on the injection site just before cleansing with alcohol. This may help reduce pain CLARITIN - use Claritin (generic name is loratadine) for the first two weeks of treatment or the day of, the day before, and the day after injecting. This will help to minimize injection site reactions CORTISONE CREAM - apply if injection site is irritated and itching CALL ME - if injection site reaction is bigger than the size of your fist, looks infected, blisters, or if you develop hives  Vaccines You are taking a medication(s) that can suppress your immune system.  The following immunizations are recommended: Flu annually Covid-19  Td/Tdap (tetanus, diphtheria, pertussis) every 10 years Pneumonia (Prevnar 15 then Pneumovax 23 at least 1 year apart.  Alternatively, can take Prevnar 20 without needing additional dose) Shingrix: 2 doses from 4 weeks to 6 months apart  Please check with your PCP to make sure you are up to date.

## 2021-08-29 ENCOUNTER — Other Ambulatory Visit: Payer: Self-pay

## 2021-08-29 ENCOUNTER — Ambulatory Visit: Payer: BC Managed Care – PPO | Admitting: Pharmacist

## 2021-08-29 VITALS — BP 159/98 | HR 86

## 2021-08-29 DIAGNOSIS — M0579 Rheumatoid arthritis with rheumatoid factor of multiple sites without organ or systems involvement: Secondary | ICD-10-CM

## 2021-08-29 DIAGNOSIS — Z79899 Other long term (current) drug therapy: Secondary | ICD-10-CM

## 2021-08-29 MED ORDER — ENBREL MINI 50 MG/ML ~~LOC~~ SOCT: 50.0000 mg | SUBCUTANEOUS | 0 refills | Status: DC

## 2021-09-05 ENCOUNTER — Telehealth: Payer: Self-pay

## 2021-09-05 NOTE — Telephone Encounter (Signed)
Patient left a voicemail stating she started Enbrel injection on 08/29/21 in the office.  Patient states she was scheduled to take her second dose this morning.  Patient states she had some problems and something went wrong with the injection, but as far as she knows she did get the injection.  Patient states the support group at Enbrel wanted her to call the office to let Dr. Corliss Skains and her nurse know that they will be contacting them to order another cartridge.

## 2021-09-05 NOTE — Telephone Encounter (Signed)
Patient advised we will be on the lookout for the request for replacement cartridge and approve it once received. Patient expressed understanding.

## 2021-09-06 ENCOUNTER — Telehealth: Payer: Self-pay | Admitting: Rheumatology

## 2021-09-06 NOTE — Telephone Encounter (Signed)
[facility] calling to get a verbal to dispense 1 Enbrel mini replacement for patient at no cost. Please reference case # [id number]

## 2021-09-09 NOTE — Telephone Encounter (Signed)
Spoke with Reuel Boom, pharmacist and provided verbal authorization for replacement cartridge.

## 2021-09-10 ENCOUNTER — Other Ambulatory Visit: Payer: Self-pay | Admitting: Physician Assistant

## 2021-09-10 DIAGNOSIS — M0579 Rheumatoid arthritis with rheumatoid factor of multiple sites without organ or systems involvement: Secondary | ICD-10-CM

## 2021-09-11 NOTE — Telephone Encounter (Signed)
Next Visit: 09/27/2021  Last Visit: 08/16/2021  Labs: 07/03/2021 CBC WNL.  ALT remains slightly elevated-40 but has not worsened since adding arava. AST WNL.   Rest of CMP WNL  Eye exam: 05/06/2021 WNL   Current Dose per office note 08/16/2021: PLQ 200 mg BID  ZT:[id number] rheumatoid arthritis of multiple sites   Last Fill: 05/02/2021  Okay to refill Plaquenil?

## 2021-09-13 NOTE — Progress Notes (Signed)
Office Visit Note  Patient: Shelley Morrison             Date of Birth: Mar 01, 1966           MRN: [id number]             PCP: Gilda Crease, MD Referring: Gilda Crease, MD Visit Date: 09/27/2021 Occupation: @GUAROCC @  Subjective:  Medication monitoring   History of Present Illness: Shelley Morrison is a 55 y.o. female seropositive rheumatoid arthritis and osteoarthritis.  Patient is currently on Enbrel 50 mg subcutaneous injections once weekly and Plaquenil 200 mg 1 tablet by mouth twice daily.  She previously could not tolerate Arava or methotrexate.  She initiated Enbrel on 08/29/2021.  She has been tolerating Enbrel without any side effects.  She denies any injection site reactions.  She states that she has noticed about an 80% improvement in her joint pain and inflammation since initiating therapy.  She states the swelling and stiffness in her hands has been improving. She denies any recent infections.   Activities of Daily Living:  Patient reports morning stiffness for 2 minutes  Patient Denies nocturnal pain.  Difficulty dressing/grooming: Denies Difficulty climbing stairs: Denies Difficulty getting out of chair: Denies Difficulty using hands for taps, buttons, cutlery, and/or writing: Denies  Review of Systems  Constitutional:  Negative for fatigue.  HENT:  Negative for mouth sores, mouth dryness and nose dryness.   Eyes:  Positive for dryness (Refresh). Negative for pain and visual disturbance.  Respiratory:  Negative for cough, hemoptysis, shortness of breath and difficulty breathing.   Cardiovascular:  Negative for chest pain, palpitations, hypertension and swelling in legs/feet.  Gastrointestinal:  Negative for blood in stool, constipation and diarrhea.  Endocrine: Negative for increased urination.  Genitourinary:  Negative for painful urination.  Musculoskeletal:  Positive for joint pain, joint pain and morning stiffness. Negative for joint swelling, myalgias,  muscle weakness, muscle tenderness and myalgias.  Skin:  Negative for color change, pallor, rash, hair loss, nodules/bumps, skin tightness, ulcers and sensitivity to sunlight.  Allergic/Immunologic: Negative for susceptible to infections.  Neurological:  Negative for dizziness, numbness, headaches and weakness.  Hematological:  Negative for swollen glands.  Psychiatric/Behavioral:  Positive for sleep disturbance (Trazodone). Negative for depressed mood. The patient is nervous/anxious.    PMFS History:  Patient Active Problem List   Diagnosis Date Noted   Rheumatoid arthritis with rheumatoid factor of multiple sites without organ or systems involvement (HCC) 06/19/2017   High risk medication use 06/18/2017   Vitamin D deficiency 06/18/2017   Primary insomnia 05/19/2017   Essential hypertension 05/19/2017   ANA positive 05/08/2017   Rheumatoid factor positive 05/08/2017    Past Medical History:  Diagnosis Date   Hypertension    Lupus (HCC)    Rheumatoid arthritis (HCC)     Family History  Problem Relation Age of Onset   Healthy Son    Healthy Daughter    Past Surgical History:  Procedure Laterality Date   CHOLECYSTECTOMY     Social History   Social History Narrative   Not on file   Immunization History  Administered Date(s) Administered   PFIZER(Purple Top)SARS-COV-2 Vaccination 09/24/2020, 10/15/2020     Objective: Vital Signs: BP 125/78 (BP Location: Left Arm, Patient Position: Sitting, Cuff Size: Small)   Pulse 86   Resp 13   Ht 5\' 10"  (1.778 m)   Wt 213 lb (96.6 kg)   BMI 30.56 kg/m    Physical Exam Vitals and  nursing note reviewed.  Constitutional:      Appearance: She is well-developed.  HENT:     Head: Normocephalic and atraumatic.  Eyes:     Conjunctiva/sclera: Conjunctivae normal.  Pulmonary:     Effort: Pulmonary effort is normal.  Abdominal:     Palpations: Abdomen is soft.  Musculoskeletal:     Cervical back: Normal range of motion.  Skin:     General: Skin is warm and dry.     Capillary Refill: Capillary refill takes less than 2 seconds.  Neurological:     Mental Status: She is alert and oriented to person, place, and time.  Psychiatric:        Behavior: Behavior normal.     Musculoskeletal Exam: C-spine has good range of motion with no discomfort.  Shoulder joints and elbow joints have good range of motion with no discomfort.  Slightly limited extension of the right wrist joint.  PIP and DIP thickening consistent with osteoarthritis of both hands.  No tenderness or synovitis over MCP joints on examination today.  Complete fist formation bilaterally.  Hip joints have good range of motion with no discomfort at this time.  Knee joints have good range of motion with no warmth or effusion.  Ankle joints have good range of motion with no tenderness or joint swelling.  CDAI Exam: CDAI Score: 0.4  Patient Global: 2 mm; Provider Global: 2 mm Swollen: 0 ; Tender: 0  Joint Exam 09/27/2021   No joint exam has been documented for this visit   There is currently no information documented on the homunculus. Go to the Rheumatology activity and complete the homunculus joint exam.  Investigation: No additional findings.  Imaging: No results found.  Recent Labs: Lab Results  Component Value Date   WBC 5.3 07/03/2021   HGB 13.2 07/03/2021   PLT 244 07/03/2021   NA 137 07/03/2021   K 4.1 07/03/2021   CL 104 07/03/2021   CO2 27 07/03/2021   GLUCOSE 91 07/03/2021   BUN 11 07/03/2021   CREATININE 0.92 07/03/2021   BILITOT 0.3 07/03/2021   ALKPHOS 45 08/24/2017   AST 27 07/03/2021   ALT 40 (H) 07/03/2021   PROT 7.4 07/03/2021   ALBUMIN 3.7 08/24/2017   CALCIUM 9.5 07/03/2021   GFRAA 82 07/03/2021   QFTBGOLDPLUS NEGATIVE 08/16/2021    Speciality Comments: PLQ Eye Exam: 05/06/2021 WNL @ [facility] Follow up in 6 months, methotrexate-fatigue nausea and brain fog Arava-hair loss, inadequate response  Procedures:  No  procedures performed Allergies: Acetaminophen-codeine   Assessment / Plan:     Visit Diagnoses: Seropositive rheumatoid arthritis of multiple sites [facility]): She has no joint tenderness or synovitis on examination today.  She has noticed an 80% improvement in her joint pain and inflammation since initiating Enbrel on 08/29/2021.  She continues to take Plaquenil 200 mg 1 tablet by mouth twice daily.  She has been tolerating both medications without any side effects or injection site reactions.  Her morning stiffness has been improving and she has not been experiencing any nocturnal pain.  She will continue on combination therapy as prescribed.  She is advised to notify us if she develops increased joint pain or joint swelling.  She will follow-up in the office in 3 months.  High risk medication use - Enbrel 50 mg subcutaneous injections once weekly-initiated on 08/29/21.  She continues to take Plaquenil 200 mg 1 tablet by mouth twice daily.  CBC and CMP were drawn on 07/03/2021.  She is due to update lab work today.  Orders for CBC and CMP were released.  She will continue to require lab monitoring every 3 months to monitor for drug toxicity.  TB Gold negative on 08/16/2021.  PLQ Eye Exam: 05/06/2021 WNL @ [facility]. - Plan: CBC with Differential/Platelet, COMPLETE METABOLIC PANEL WITH GFR Intolerance to Arava and methotrexate in the past. Discussed the importance of holding Enbrel if she develops signs or symptoms of an infection and to resume once the infection has completely cleared.  She voiced understanding.  Primary osteoarthritis of both hands: She has PIP and DIP prominence consistent with osteoarthritis of both hands.  No tenderness or inflammation was noted on examination today.  She was able to make a complete fist bilaterally.  Primary osteoarthritis of both feet: She is not experiencing any discomfort in her feet at this time.  She has no tenderness over MTP joints.  Good range of motion of  both ankle joints with no joint swelling.  She is wearing proper fitting shoes.  Primary osteoarthritis of left hip: She has good range of motion of the left hip joint with no discomfort.  History of vitamin D deficiency: She takes vitamin D 2000 units daily.  Primary insomnia: She takes trazodone 150 mg at bedtime as needed for insomnia.  History of hypertension: BP was 125/78 today in the office.   Orders: Orders Placed This Encounter  Procedures   CBC with Differential/Platelet   COMPLETE METABOLIC PANEL WITH GFR   No orders of the defined types were placed in this encounter.     Follow-Up Instructions: Return in about 3 months (around 12/27/2021) for Rheumatoid arthritis, Osteoarthritis.   Gearldine Bienenstock, PA-C  Note - This record has been created using Dragon software.  Chart creation errors have been sought, but may not always  have been located. Such creation errors do not reflect on  the standard of medical care.

## 2021-09-16 DIAGNOSIS — Z760 Encounter for issue of repeat prescription: Secondary | ICD-10-CM | POA: Diagnosis not present

## 2021-09-16 DIAGNOSIS — I10 Essential (primary) hypertension: Secondary | ICD-10-CM | POA: Diagnosis not present

## 2021-09-16 DIAGNOSIS — F419 Anxiety disorder, unspecified: Secondary | ICD-10-CM | POA: Diagnosis not present

## 2021-09-16 DIAGNOSIS — Z1211 Encounter for screening for malignant neoplasm of colon: Secondary | ICD-10-CM | POA: Diagnosis not present

## 2021-09-16 DIAGNOSIS — L659 Nonscarring hair loss, unspecified: Secondary | ICD-10-CM | POA: Diagnosis not present

## 2021-09-16 DIAGNOSIS — Z136 Encounter for screening for cardiovascular disorders: Secondary | ICD-10-CM | POA: Diagnosis not present

## 2021-09-16 DIAGNOSIS — Z130 Encounter for screening for diseases of the blood and blood-forming organs and certain disorders involving the immune mechanism: Secondary | ICD-10-CM | POA: Diagnosis not present

## 2021-09-27 ENCOUNTER — Encounter: Payer: Self-pay | Admitting: Physician Assistant

## 2021-09-27 ENCOUNTER — Other Ambulatory Visit: Payer: Self-pay

## 2021-09-27 ENCOUNTER — Ambulatory Visit: Payer: BC Managed Care – PPO | Admitting: Physician Assistant

## 2021-09-27 VITALS — BP 125/78 | HR 86 | Resp 13 | Ht 70.0 in | Wt 213.0 lb

## 2021-09-27 DIAGNOSIS — M1612 Unilateral primary osteoarthritis, left hip: Secondary | ICD-10-CM

## 2021-09-27 DIAGNOSIS — M0579 Rheumatoid arthritis with rheumatoid factor of multiple sites without organ or systems involvement: Secondary | ICD-10-CM

## 2021-09-27 DIAGNOSIS — F5101 Primary insomnia: Secondary | ICD-10-CM

## 2021-09-27 DIAGNOSIS — Z8679 Personal history of other diseases of the circulatory system: Secondary | ICD-10-CM

## 2021-09-27 DIAGNOSIS — M19041 Primary osteoarthritis, right hand: Secondary | ICD-10-CM | POA: Diagnosis not present

## 2021-09-27 DIAGNOSIS — Z79899 Other long term (current) drug therapy: Secondary | ICD-10-CM

## 2021-09-27 DIAGNOSIS — M19071 Primary osteoarthritis, right ankle and foot: Secondary | ICD-10-CM

## 2021-09-27 DIAGNOSIS — Z8639 Personal history of other endocrine, nutritional and metabolic disease: Secondary | ICD-10-CM

## 2021-09-27 DIAGNOSIS — M19072 Primary osteoarthritis, left ankle and foot: Secondary | ICD-10-CM

## 2021-09-27 DIAGNOSIS — M19042 Primary osteoarthritis, left hand: Secondary | ICD-10-CM

## 2021-09-27 NOTE — Patient Instructions (Signed)
Standing Labs We placed an order today for your standing lab work.   Please have your standing labs drawn in December and every 3 months    If possible, please have your labs drawn 2 weeks prior to your appointment so that the provider can discuss your results at your appointment.  Please note that you may see your imaging and lab results in MyChart before we have reviewed them. We may be awaiting multiple results to interpret others before contacting you. Please allow our office up to 72 hours to thoroughly review all of the results before contacting the office for clarification of your results.  We have open lab daily: Monday through Thursday from 1:30-4:30 PM and Friday from 1:30-4:00 PM at the office of Dr. Shaili Deveshwar, [facility].   Please be advised, all patients with office appointments requiring lab work will take precedent over walk-in lab work.  If possible, please come for your lab work on Monday and Friday afternoons, as you may experience shorter wait times. The office is located at [address], [address], [place], [place] [address] No appointment is necessary.   Labs are drawn by [facility]. Please bring your co-pay at the time of your lab draw.  You may receive a bill from [facility] for your lab work.  If you wish to have your labs drawn at another location, please call the office 24 hours in advance to send orders.  If you have any questions regarding directions or hours of operation,  please call [phone].   As a reminder, please drink plenty of water prior to coming for your lab work. Thanks!  

## 2021-09-28 LAB — COMPLETE METABOLIC PANEL WITH GFR
AG Ratio: 1.3 (calc) (ref 1.0–2.5)
ALT: 44 U/L — ABNORMAL HIGH (ref 6–29)
AST: 28 U/L (ref 10–35)
Albumin: 4.2 g/dL (ref 3.6–5.1)
Alkaline phosphatase (APISO): 71 U/L (ref 37–153)
BUN: 11 mg/dL (ref 7–25)
CO2: 27 mmol/L (ref 20–32)
Calcium: 9.8 mg/dL (ref 8.6–10.4)
Chloride: 103 mmol/L (ref 98–110)
Creat: 0.9 mg/dL (ref 0.50–1.03)
Globulin: 3.2 g/dL (calc) (ref 1.9–3.7)
Glucose, Bld: 155 mg/dL — ABNORMAL HIGH (ref 65–99)
Potassium: 4.2 mmol/L (ref 3.5–5.3)
Sodium: 138 mmol/L (ref 135–146)
Total Bilirubin: 0.4 mg/dL (ref 0.2–1.2)
Total Protein: 7.4 g/dL (ref 6.1–8.1)
eGFR: 76 mL/min/{1.73_m2} (ref >=60)

## 2021-09-28 LAB — CBC WITH DIFFERENTIAL/PLATELET
Absolute Monocytes: 512 cells/uL (ref 200–950)
Basophils Absolute: 67 cells/uL (ref 0–200)
Basophils Relative: 1.1 %
Eosinophils Absolute: 140 cells/uL (ref 15–500)
Eosinophils Relative: 2.3 %
HCT: 41.4 % (ref 35.0–45.0)
Hemoglobin: 13.5 g/dL (ref 11.7–15.5)
Lymphs Abs: 2684 cells/uL (ref 850–3900)
MCH: 28.4 pg (ref 27.0–33.0)
MCHC: 32.6 g/dL (ref 32.0–36.0)
MCV: 87 fL (ref 80.0–100.0)
MPV: 10.6 fL (ref 7.5–12.5)
Monocytes Relative: 8.4 %
Neutro Abs: 2696 cells/uL (ref 1500–7800)
Neutrophils Relative %: 44.2 %
Platelets: 261 10*3/uL (ref 140–400)
RBC: 4.76 10*6/uL (ref 3.80–5.10)
RDW: 12 % (ref 11.0–15.0)
Total Lymphocyte: 44 %
WBC: 6.1 10*3/uL (ref 3.8–10.8)

## 2021-09-28 NOTE — Progress Notes (Signed)
ALT remains slightly elevated.  AST WNL.  Glucose is elevated-155.  CBC WNL.  Ok to continue Enbrel and PLQ.  She should avoid tylenol, NSAIDs, and alcohol use.

## 2021-10-11 DIAGNOSIS — F419 Anxiety disorder, unspecified: Secondary | ICD-10-CM | POA: Diagnosis not present

## 2021-10-11 DIAGNOSIS — Z760 Encounter for issue of repeat prescription: Secondary | ICD-10-CM | POA: Diagnosis not present

## 2021-10-11 DIAGNOSIS — Z1211 Encounter for screening for malignant neoplasm of colon: Secondary | ICD-10-CM | POA: Diagnosis not present

## 2021-10-11 DIAGNOSIS — I10 Essential (primary) hypertension: Secondary | ICD-10-CM | POA: Diagnosis not present

## 2021-10-31 ENCOUNTER — Other Ambulatory Visit: Payer: Self-pay | Admitting: Rheumatology

## 2021-10-31 DIAGNOSIS — M0579 Rheumatoid arthritis with rheumatoid factor of multiple sites without organ or systems involvement: Secondary | ICD-10-CM

## 2021-11-07 ENCOUNTER — Other Ambulatory Visit: Payer: Self-pay | Admitting: Physician Assistant

## 2021-11-07 DIAGNOSIS — M0579 Rheumatoid arthritis with rheumatoid factor of multiple sites without organ or systems involvement: Secondary | ICD-10-CM

## 2021-11-12 ENCOUNTER — Telehealth: Payer: Self-pay

## 2021-11-12 NOTE — Telephone Encounter (Signed)
She should discuss pneumococcal dose with her PCP.

## 2021-11-12 NOTE — Telephone Encounter (Signed)
Patient left a voicemail stating Dr. Corliss Skains suggested that she have flu vaccine.  Patient states the only dose they are offering is the one for 39 and older and requested a return call to let her know if that strength is okay for her to take.

## 2021-11-12 NOTE — Telephone Encounter (Signed)
Patient advised she should discuss dose with her PCP. Patient expressed understanding.

## 2021-11-20 ENCOUNTER — Other Ambulatory Visit: Payer: Self-pay

## 2021-11-20 DIAGNOSIS — M0579 Rheumatoid arthritis with rheumatoid factor of multiple sites without organ or systems involvement: Secondary | ICD-10-CM

## 2021-11-20 MED ORDER — ENBREL MINI 50 MG/ML ~~LOC~~ SOCT: 50.0000 mg | SUBCUTANEOUS | 0 refills | Status: DC

## 2021-11-20 NOTE — Telephone Encounter (Signed)
Patient left a voicemail requesting prescription refill of Enbrel to be sent to [facility].

## 2021-11-20 NOTE — Telephone Encounter (Signed)
Next Visit: 12/31/2021  Last Visit: 09/27/2021  Last Fill: 08/29/2021  FS:[id number] rheumatoid arthritis of multiple sites   Current Dose per office note 09/27/2021: Enbrel 50 mg subcutaneous injections once weekly  Labs: 09/27/2021 ALT remains slightly elevated.  AST WNL.  Glucose is elevated-155.  CBC WNL.   TB Gold: 08/16/2021 Neg    Okay to refill Enbrel?

## 2021-11-25 DIAGNOSIS — Z79899 Other long term (current) drug therapy: Secondary | ICD-10-CM | POA: Diagnosis not present

## 2021-11-25 DIAGNOSIS — M329 Systemic lupus erythematosus, unspecified: Secondary | ICD-10-CM | POA: Diagnosis not present

## 2021-11-25 DIAGNOSIS — M0569 Rheumatoid arthritis of multiple sites with involvement of other organs and systems: Secondary | ICD-10-CM | POA: Diagnosis not present

## 2021-12-17 NOTE — Progress Notes (Signed)
Office Visit Note  Patient: Shelley Morrison             Date of Birth: [birth year]           MRN: [id number]             PCP: Javier Docker, MD Referring: Javier Docker, MD Visit Date: 12/31/2021 Occupation: @GUAROCC @  Subjective:  Medication management  History of Present Illness: KINLEA GEHO is a 55 y.o. female with the history of seropositive rheumatoid arthritis.  She was started on Enbrel in September 2022.  She has done very well on Enbrel.  She denies any joint pain or joint swelling.  She discontinued Plaquenil and did not notice any worsening of her symptoms.  She has been tolerating Enbrel without any side effects.  Her left hip pain resolved.  Activities of Daily Living:  Patient reports morning stiffness for 10 minutes.   Patient Denies nocturnal pain.  Difficulty dressing/grooming: Denies Difficulty climbing stairs: Denies Difficulty getting out of chair: Denies Difficulty using hands for taps, buttons, cutlery, and/or writing: Denies  Review of Systems  Constitutional:  Negative for fatigue, night sweats, weight gain and weight loss.  HENT:  Negative for mouth sores, trouble swallowing, trouble swallowing, mouth dryness and nose dryness.   Eyes:  Positive for dryness. Negative for pain, redness, itching and visual disturbance.  Respiratory:  Negative for cough, shortness of breath and difficulty breathing.   Cardiovascular:  Negative for chest pain, palpitations, hypertension, irregular heartbeat and swelling in legs/feet.  Gastrointestinal:  Negative for blood in stool, constipation and diarrhea.  Endocrine: Negative for increased urination.  Genitourinary:  Negative for difficulty urinating and vaginal dryness.  Musculoskeletal:  Positive for morning stiffness. Negative for joint pain, joint pain, joint swelling, myalgias, muscle weakness, muscle tenderness and myalgias.  Skin:  Negative for color change, rash, hair loss, redness, skin tightness, ulcers and  sensitivity to sunlight.  Allergic/Immunologic: Negative for susceptible to infections.  Neurological:  Negative for dizziness, numbness, headaches, memory loss, night sweats and weakness.  Hematological:  Negative for bruising/bleeding tendency and swollen glands.  Psychiatric/Behavioral:  Negative for depressed mood, confusion and sleep disturbance. The patient is not nervous/anxious.    PMFS History:  Patient Active Problem List   Diagnosis Date Noted   Rheumatoid arthritis with rheumatoid factor of multiple sites without organ or systems involvement ([place]) 06/19/2017   High risk medication use 06/18/2017   Vitamin D deficiency 06/18/2017   Primary insomnia 05/19/2017   Essential hypertension 05/19/2017   ANA positive 05/08/2017   Rheumatoid factor positive 05/08/2017    Past Medical History:  Diagnosis Date   Hypertension    Lupus ([place])    Rheumatoid arthritis ([place])     Family History  Problem Relation Age of Onset   Healthy Son    Healthy Daughter    Past Surgical History:  Procedure Laterality Date   CHOLECYSTECTOMY     Social History   Social History Narrative   Not on file   Immunization History  Administered Date(s) Administered   PFIZER(Purple Top)SARS-COV-2 Vaccination 09/24/2020, 10/15/2020     Objective: Vital Signs: BP (!) 150/93 (BP Location: Left Arm, Patient Position: Sitting, Cuff Size: Normal)    Pulse 63    Ht 5\' 10"  (1.778 m)    Wt 212 lb 3.2 oz (96.3 kg)    BMI 30.45 kg/m    Physical Exam Vitals and nursing note reviewed.  Constitutional:      Appearance: She  is well-developed.  HENT:     Head: Normocephalic and atraumatic.  Eyes:     Conjunctiva/sclera: Conjunctivae normal.  Cardiovascular:     Rate and Rhythm: Normal rate and regular rhythm.     Heart sounds: Normal heart sounds.  Pulmonary:     Effort: Pulmonary effort is normal.     Breath sounds: Normal breath sounds.  Abdominal:     General: Bowel sounds are normal.      Palpations: Abdomen is soft.  Musculoskeletal:     Cervical back: Normal range of motion.  Lymphadenopathy:     Cervical: No cervical adenopathy.  Skin:    General: Skin is warm and dry.     Capillary Refill: Capillary refill takes less than 2 seconds.  Neurological:     Mental Status: She is alert and oriented to person, place, and time.  Psychiatric:        Behavior: Behavior normal.     Musculoskeletal Exam: C-spine was in good range of motion.  Shoulder joints, elbow joints Barras joints, MCPs PIPs and DIPs with good range of motion with no synovitis.  Hip joints, knee joints, ankles, MTPs and PIPs with good range of motion with no synovitis.  CDAI Exam: CDAI Score: 0  Patient Global: 0 mm; Provider Global: 0 mm Swollen: 0 ; Tender: 0  Joint Exam 12/31/2021   No joint exam has been documented for this visit   There is currently no information documented on the homunculus. Go to the Rheumatology activity and complete the homunculus joint exam.  Investigation: No additional findings.  Imaging: No results found.  Recent Labs: Lab Results  Component Value Date   WBC 6.1 09/27/2021   HGB 13.5 09/27/2021   PLT 261 09/27/2021   NA 138 09/27/2021   K 4.2 09/27/2021   CL 103 09/27/2021   CO2 27 09/27/2021   GLUCOSE 155 (H) 09/27/2021   BUN 11 09/27/2021   CREATININE 0.90 09/27/2021   BILITOT 0.4 09/27/2021   ALKPHOS 45 08/24/2017   AST 28 09/27/2021   ALT 44 (H) 09/27/2021   PROT 7.4 09/27/2021   ALBUMIN 3.7 08/24/2017   CALCIUM 9.8 09/27/2021   GFRAA 82 07/03/2021   QFTBGOLDPLUS NEGATIVE 08/16/2021    Speciality Comments: PLQ Eye Exam:11/25/2021 WNL @ [facility] Follow up in 6 months, methotrexate-fatigue nausea and brain fog Arava-hair loss, inadequate response  Procedures:  No procedures performed Allergies: Acetaminophen-codeine   Assessment / Plan:     Visit Diagnoses: Seropositive rheumatoid arthritis of multiple sites (HCC)-she is  clinically doing much better on Enbrel.  She had no synovitis on examination.  Patient discontinued hydroxychloroquine and has not noted any worsening of her symptoms.  High risk medication use - Enbrel 50 mg subcutaneous injections once weekly-initiated on 08/29/21, she discontinued Plaquenil.Marland Kitchen PLQ Eye Exam:11/25/21 -labs from September 27, 2021 were reviewed which were within normal limits except for mild elevation of LFTs.  Plan: CBC with Differential/Platelet, COMPLETE METABOLIC PANEL WITH GFR today and then every 3 months to monitor for drug toxicity.  She has been advised to hold Enbrel in case she develops an infection and resume after the infection resolves.  Annual skin examination to screen for skin cancer was advised.  Information regarding realization was also placed in the AVS.  Primary osteoarthritis of both hands-she had no tenderness on examination.  Primary osteoarthritis of both feet-denies discomfort.  Primary osteoarthritis of left hip-denies discomfort.  History of vitamin D deficiency-her last vitamin D level was  normal.  Primary insomnia - trazodone 150 mg at bedtime as needed for insomnia.  History of hypertension-blood pressure was elevated.  She has been advised to monitor blood pressure closely.  Increased risk of heart disease with the rheumatoid arthritis was discussed.  Dietary modification and exercise was emphasized.  Orders: Orders Placed This Encounter  Procedures   CBC with Differential/Platelet   COMPLETE METABOLIC PANEL WITH GFR   No orders of the defined types were placed in this encounter.    Follow-Up Instructions: Return in about 4 months (around 04/30/2022) for Rheumatoid arthritis.   Pollyann Savoy, MD  Note - This record has been created using Animal nutritionist.  Chart creation errors have been sought, but may not always  have been located. Such creation errors do not reflect on  the standard of medical care.

## 2021-12-31 ENCOUNTER — Other Ambulatory Visit: Payer: Self-pay

## 2021-12-31 ENCOUNTER — Encounter: Payer: Self-pay | Admitting: Rheumatology

## 2021-12-31 ENCOUNTER — Ambulatory Visit (INDEPENDENT_AMBULATORY_CARE_PROVIDER_SITE_OTHER): Payer: BC Managed Care – PPO | Admitting: Rheumatology

## 2021-12-31 VITALS — BP 150/93 | HR 63 | Ht 70.0 in | Wt 212.2 lb

## 2021-12-31 DIAGNOSIS — Z79899 Other long term (current) drug therapy: Secondary | ICD-10-CM | POA: Diagnosis not present

## 2021-12-31 DIAGNOSIS — M0579 Rheumatoid arthritis with rheumatoid factor of multiple sites without organ or systems involvement: Secondary | ICD-10-CM | POA: Diagnosis not present

## 2021-12-31 DIAGNOSIS — Z8639 Personal history of other endocrine, nutritional and metabolic disease: Secondary | ICD-10-CM

## 2021-12-31 DIAGNOSIS — M19041 Primary osteoarthritis, right hand: Secondary | ICD-10-CM

## 2021-12-31 DIAGNOSIS — M19072 Primary osteoarthritis, left ankle and foot: Secondary | ICD-10-CM

## 2021-12-31 DIAGNOSIS — F5101 Primary insomnia: Secondary | ICD-10-CM

## 2021-12-31 DIAGNOSIS — Z8679 Personal history of other diseases of the circulatory system: Secondary | ICD-10-CM

## 2021-12-31 DIAGNOSIS — M19042 Primary osteoarthritis, left hand: Secondary | ICD-10-CM | POA: Diagnosis not present

## 2021-12-31 DIAGNOSIS — M19071 Primary osteoarthritis, right ankle and foot: Secondary | ICD-10-CM | POA: Diagnosis not present

## 2021-12-31 DIAGNOSIS — M1612 Unilateral primary osteoarthritis, left hip: Secondary | ICD-10-CM

## 2021-12-31 LAB — COMPLETE METABOLIC PANEL WITH GFR
AG Ratio: 1.3 (calc) (ref 1.0–2.5)
ALT: 44 U/L — ABNORMAL HIGH (ref 6–29)
AST: 29 U/L (ref 10–35)
Albumin: 4.4 g/dL (ref 3.6–5.1)
Alkaline phosphatase (APISO): 69 U/L (ref 37–153)
BUN: 7 mg/dL (ref 7–25)
CO2: 29 mmol/L (ref 20–32)
Calcium: 9.9 mg/dL (ref 8.6–10.4)
Chloride: 104 mmol/L (ref 98–110)
Creat: 0.83 mg/dL (ref 0.50–1.03)
Globulin: 3.5 g/dL (calc) (ref 1.9–3.7)
Glucose, Bld: 84 mg/dL (ref 65–99)
Potassium: 4.3 mmol/L (ref 3.5–5.3)
Sodium: 139 mmol/L (ref 135–146)
Total Bilirubin: 0.4 mg/dL (ref 0.2–1.2)
Total Protein: 7.9 g/dL (ref 6.1–8.1)
eGFR: 83 mL/min/{1.73_m2} (ref >=60)

## 2021-12-31 LAB — CBC WITH DIFFERENTIAL/PLATELET
Absolute Monocytes: 666 cells/uL (ref 200–950)
Basophils Absolute: 50 cells/uL (ref 0–200)
Basophils Relative: 0.9 %
Eosinophils Absolute: 143 cells/uL (ref 15–500)
Eosinophils Relative: 2.6 %
HCT: 41.3 % (ref 35.0–45.0)
Hemoglobin: 13.3 g/dL (ref 11.7–15.5)
Lymphs Abs: 2937 cells/uL (ref 850–3900)
MCH: 28.7 pg (ref 27.0–33.0)
MCHC: 32.2 g/dL (ref 32.0–36.0)
MCV: 89 fL (ref 80.0–100.0)
MPV: 10.4 fL (ref 7.5–12.5)
Monocytes Relative: 12.1 %
Neutro Abs: 1705 cells/uL (ref 1500–7800)
Neutrophils Relative %: 31 %
Platelets: 242 10*3/uL (ref 140–400)
RBC: 4.64 10*6/uL (ref 3.80–5.10)
RDW: 12.4 % (ref 11.0–15.0)
Total Lymphocyte: 53.4 %
WBC: 5.5 10*3/uL (ref 3.8–10.8)

## 2021-12-31 NOTE — Patient Instructions (Signed)
Standing Labs We placed an order today for your standing lab work.   Please have your standing labs drawn in April and every 3 months  If possible, please have your labs drawn 2 weeks prior to your appointment so that the provider can discuss your results at your appointment.  Please note that you may see your imaging and lab results in MyChart before we have reviewed them. We may be awaiting multiple results to interpret others before contacting you. Please allow our office up to 72 hours to thoroughly review all of the results before contacting the office for clarification of your results.  We have open lab daily: Monday through Thursday from 1:30-4:30 PM and Friday from 1:30-4:00 PM at the office of Dr. Pollyann Savoy, [facility].   Please be advised, all patients with office appointments requiring lab work will take precedent over walk-in lab work.  If possible, please come for your lab work on Monday and Friday afternoons, as you may experience shorter wait times. The office is located at [address], [place], [place] [address] No appointment is necessary.   Labs are drawn by [facility]. Please bring your co-pay at the time of your lab draw.  You may receive a bill from [facility] for your lab work.  If you wish to have your labs drawn at another location, please call the office 24 hours in advance to send orders.  If you have any questions regarding directions or hours of operation,  please call [phone].   As a reminder, please drink plenty of water prior to coming for your lab work. Thanks!   Vaccines You are taking a medication(s) that can suppress your immune system.  The following immunizations are recommended: Flu annually Covid-19  Td/Tdap (tetanus, diphtheria, pertussis) every 10 years Pneumonia (Prevnar 15 then Pneumovax 23 at least 1 year apart.  Alternatively, can take Prevnar 20 without needing additional dose) Shingrix: 2 doses from 4 weeks  to 6 months apart  Please check with your PCP to make sure you are up to date.   If you have signs or symptoms of an infection or start antibiotics: First, call your PCP for workup of your infection. Hold your medication through the infection, until you complete your antibiotics, and until symptoms resolve if you take the following: Injectable medication (Actemra, Benlysta, Cimzia, Cosentyx, Enbrel, Humira, Kevzara, Orencia, Remicade, Simponi, Stelara, Taltz, Tremfya) Methotrexate Leflunomide (Arava) Mycophenolate (Cellcept) [place]borne [place], or Rinvoq   Please get annual skin examination to screen for skin cancer by dermatologist

## 2022-01-01 NOTE — Progress Notes (Signed)
CBC is normal.  CMP shows elevated ALT which is a stable.  Please advise patient to avoid Tylenol and NSAID use.  Please forward results to her PCP.

## 2022-01-28 ENCOUNTER — Other Ambulatory Visit: Payer: Self-pay | Admitting: Physician Assistant

## 2022-01-28 DIAGNOSIS — M0579 Rheumatoid arthritis with rheumatoid factor of multiple sites without organ or systems involvement: Secondary | ICD-10-CM

## 2022-01-28 NOTE — Telephone Encounter (Signed)
Next Visit: 05/05/2022  Last Visit: 12/31/2021  Last Fill: 11/20/2021  [id number] rheumatoid arthritis of multiple sites  Current Dose per office note 12/31/2021: Enbrel 50 mg subcutaneous injections once weekly  Labs: 12/31/2021 CBC is normal.  CMP shows elevated ALT which is a stable.   TB Gold: 08/16/2021 Neg    Okay to refill Enbrel?

## 2022-02-10 NOTE — Progress Notes (Signed)
Office Visit Note  Patient: Shelley Morrison             Date of Birth: July 24, 1966           MRN: [id number]             PCP: Javier Docker, MD Referring: Javier Docker, MD Visit Date: 02/24/2022 Occupation: @GUAROCC @  Subjective:  Discussed medications  History of Present Illness: Shelley Morrison is a 56 y.o. female with history of seropositive rheumatoid arthritis and osteoarthritis.  Patient remains on Enbrel 50 mg subcu injections once weekly.  She has not missed any doses of Enbrel recently.  She is tolerating without any side effects.  She presents today to discuss other treatment options.  She has noticed increased joint stiffness since discontinuing Plaquenil after her last office visit.  According to the patient she discontinued due to Plaquenil eye toxicity.  She has been having to take Tylenol several days a week for symptomatic relief.  She is having some pain and tenderness in both hands but her biggest concern is the amount of stiffness she experiences especially after sitting for prolonged period of time. She has not had any recent infections.   Activities of Daily Living:  Patient reports morning stiffness for all day. Patient Denies nocturnal pain.  Difficulty dressing/grooming: Denies Difficulty climbing stairs: Denies Difficulty getting out of chair: Denies Difficulty using hands for taps, buttons, cutlery, and/or writing: Denies  Review of Systems  Constitutional:  Negative for fatigue.  HENT:  Negative for mouth sores, mouth dryness and nose dryness.   Eyes:  Positive for dryness. Negative for pain and itching.  Respiratory:  Negative for shortness of breath and difficulty breathing.   Cardiovascular:  Negative for chest pain and palpitations.  Gastrointestinal:  Negative for blood in stool, constipation and diarrhea.  Endocrine: Negative for increased urination.  Genitourinary:  Negative for difficulty urinating.  Musculoskeletal:  Positive for joint  pain, joint pain, joint swelling and morning stiffness. Negative for myalgias, muscle tenderness and myalgias.  Skin:  Negative for color change, rash and redness.  Allergic/Immunologic: Negative for susceptible to infections.  Neurological:  Negative for dizziness, numbness, headaches, memory loss and weakness.  Hematological:  Negative for bruising/bleeding tendency.  Psychiatric/Behavioral:  Negative for confusion.    PMFS History:  Patient Active Problem List   Diagnosis Date Noted   Rheumatoid arthritis with rheumatoid factor of multiple sites without organ or systems involvement ([place]) 06/19/2017   High risk medication use 06/18/2017   Vitamin D deficiency 06/18/2017   Primary insomnia 05/19/2017   Essential hypertension 05/19/2017   ANA positive 05/08/2017   Rheumatoid factor positive 05/08/2017    Past Medical History:  Diagnosis Date   Hypertension    Lupus ([place])    Rheumatoid arthritis ([place])     Family History  Problem Relation Age of Onset   Healthy Son    Healthy Daughter    Past Surgical History:  Procedure Laterality Date   CHOLECYSTECTOMY     Social History   Social History Narrative   Not on file   Immunization History  Administered Date(s) Administered   PFIZER(Purple Top)SARS-COV-2 Vaccination 09/24/2020, 10/15/2020     Objective: Vital Signs: BP 137/78 (BP Location: Left Arm, Patient Position: Sitting, Cuff Size: Large)    Pulse 70    Ht 5\' 10"  (1.778 m)    Wt 216 lb 9.6 oz (98.2 kg)    BMI 31.08 kg/m    Physical Exam Vitals  and nursing note reviewed.  Constitutional:      Appearance: She is well-developed.  HENT:     Head: Normocephalic and atraumatic.  Eyes:     Conjunctiva/sclera: Conjunctivae normal.  Cardiovascular:     Rate and Rhythm: Normal rate and regular rhythm.     Heart sounds: Normal heart sounds.  Pulmonary:     Effort: Pulmonary effort is normal.     Breath sounds: Normal breath sounds.  Abdominal:     General: Bowel  sounds are normal.     Palpations: Abdomen is soft.  Musculoskeletal:     Cervical back: Normal range of motion.  Lymphadenopathy:     Cervical: No cervical adenopathy.  Skin:    General: Skin is warm and dry.     Capillary Refill: Capillary refill takes less than 2 seconds.  Neurological:     Mental Status: She is alert and oriented to person, place, and time.  Psychiatric:        Behavior: Behavior normal.     Musculoskeletal Exam: C-spine has good range of motion with no discomfort.  Shoulder joints, elbow joints, and wrist joints have good range of motion with no discomfort or tenderness.  Tenderness over the second and third MCP joints.  Some synovitis of the right second MCP joint.  Complete fist formation bilaterally.  PIP and DIP thickening consistent with osteoarthritis of both hands.  Hip joints have good range of motion with no groin pain.  Some tenderness palpation over the left trochanteric bursa.  Knee joints have good range of motion with no warmth or effusion.  Ankle joints have good range of motion with no tenderness or joint swelling.   CDAI Exam: CDAI Score: 4  Patient Global: 5 mm; Provider Global: 5 mm Swollen: 1 ; Tender: 2  Joint Exam 02/24/2022      Right  Left  MCP 2  Swollen Tender     MCP 3   Tender        Investigation: No additional findings.  Imaging: No results found.  Recent Labs: Lab Results  Component Value Date   WBC 5.5 12/31/2021   HGB 13.3 12/31/2021   PLT 242 12/31/2021   NA 139 12/31/2021   K 4.3 12/31/2021   CL 104 12/31/2021   CO2 29 12/31/2021   GLUCOSE 84 12/31/2021   BUN 7 12/31/2021   CREATININE 0.83 12/31/2021   BILITOT 0.4 12/31/2021   ALKPHOS 45 08/24/2017   AST 29 12/31/2021   ALT 44 (H) 12/31/2021   PROT 7.9 12/31/2021   ALBUMIN 3.7 08/24/2017   CALCIUM 9.9 12/31/2021   GFRAA 82 07/03/2021   QFTBGOLDPLUS NEGATIVE 08/16/2021    Speciality Comments: PLQ Eye Exam:11/25/2021 WNL @ [facility] Follow up  in 6 months, methotrexate-fatigue nausea and brain fog Arava-hair loss, inadequate response  Procedures:  No procedures performed Allergies: Acetaminophen-codeine   Assessment / Plan:     Visit Diagnoses: Seropositive rheumatoid arthritis of multiple sites [facility]): She has tenderness over the right second and third MCP joints and synovitis of the right second MCP.  She has been experiencing increased joint stiffness over the past several weeks.  Her symptoms are most severe after being sedentary.  She has been having take Tylenol several times per week for symptomatic relief.  She remains on Enbrel 50 mg sq injections once weekly.  She has been tolerating Enbrel without any side effects or injection site reactions.  She has noticed increased arthralgias and joint stiffness since  discontinuing Plaquenil after her last office visit.  She presented today to discuss other treatment options.  She is not a good candidate for arava or methotrexate due to history of elevated LFTs.  She does not want to discontinue Enbrel at this time so she would like to try combination therapy.  Indications, contraindications, potential side effects of sulfasalazine were discussed today in detail.  All questions were addressed and consent was obtained.  A prescription for sulfasalazine 500 mg 2 tablets twice daily will be sent to the pharmacy tomorrow pending lab results.  She will follow up in 6-8 weeks to assess her response.  Medication counseling:  Baseline Immunosuppressant Labs TB GOLD Quantiferon TB Gold Latest Ref Rng & Units 08/16/2021  Quantiferon TB Gold Plus NEGATIVE NEGATIVE   Hepatitis Panel Hepatitis Latest Ref Rng & Units 05/19/2017  Hep B Surface Ag NEGATIVE NEGATIVE  Hep B IgM NON REACTIVE NON REACTIVE  Hep C Ab NEGATIVE NEGATIVE  Hep A IgM NON REACTIVE NON REACTIVE   HIV Lab Results  Component Value Date   HIV NONREACTIVE 05/19/2017   Immunoglobulins Immunoglobulin Electrophoresis Latest Ref Rng  & Units 05/19/2017  IgG 694 - 1,618 mg/dL 1,984(H)  IgM 48 - 271 mg/dL 195   SPEP Serum Protein Electrophoresis Latest Ref Rng & Units 12/31/2021  Total Protein 6.1 - 8.1 g/dL 7.9  Albumin 3.8 - 4.8 g/dL -  Alpha-1 0.2 - 0.3 g/dL -  Alpha-2 0.5 - 0.9 g/dL -  Beta Globulin 0.4 - 0.6 g/dL -  Beta 2 0.2 - 0.5 g/dL -  Gamma Globulin 0.8 - 1.7 g/dL -  Interpretation - -   G6PD Lab Results  Component Value Date   G6PDH 18.6 05/19/2017    Chest x-ray: No active cardiopulmonary disease on 05/27/2017  Does the patient have an allergy to sulfa drugs? No  Patient was counseled on the purpose, proper use, and adverse effects of sulfasalazine including risk of infection and chance of nausea, headache, and sun sensitivity.  Also discussed risk of skin rash and advised patient to stop the medication and let us know if she develops a rash. Also discussed for the potential of discoloration of the urine, sweat, or tears.  Advised patient to avoid live vaccines.  Recommend annual influenza, Pneumovax 23, Prevnar 13, and Shingrix as indicated.   Reviewed the importance of frequent labs to monitor liver, kidneys, and blood counts.  Standing orders placed and patient to return 1 month after starting therapy and then every 3 months.  Provided patient with educational materials on sulfasalazine and answered all questions.  Patient consented to sulfasalazine use, and consent will be uploaded into the media tab.    Patient dose will be 500 mg 2 tablets twice daily.  Prescription will be sent to pharmacy pending lab results and insurance approval.  High risk medication use - Enbrel 50 mg subcutaneous injections once weekly-initiated on 08/29/21.  She will be adding on sulfasalazine as discussed above.  Discontinued Plaquenil due to Plaquenil eye toxicity per patient.  She is not a good candidate for methotrexate or leflunomide due to history of elevated LFTs.- Plan: CBC with Differential/Platelet, COMPLETE METABOLIC  PANEL WITH GFR CBC and CMP were drawn on 12/31/2021.  CBC and CMP updated today.  She will return for lab work in 1 month and every 3 months to monitor for drug toxicity.  G6PD within normal limits on 05/19/2017.  TB Gold negative on 08/16/2021 and will continue to be monitored yearly. She has  not had any recent infections.  Discussed the importance of holding Enbrel and sulfasalazine if she develops signs or symptoms of an infection and to resume once the infection is completely cleared.  She voiced understanding. Discussed the importance of yearly skin examinations while on Enbrel due to the increased risk for nonmelanoma skin cancers.  Primary osteoarthritis of both hands: She has PIP and DIP thickening consistent with osteoarthritis of both hands.  No tenderness or inflammation was noted.  Complete fist formation bilaterally.  She experiences intermittent stiffness in both hands.  Discussed the importance of joint protection and muscle strengthening.  Primary osteoarthritis of both feet: She is not experiencing any discomfort in her feet at this time.  Good range of motion of both ankle joints with no tenderness or joint swelling.  Primary osteoarthritis of left hip: She has good range of motion of the left hip joint with some tenderness over the left trochanteric bursa.  No groin pain currently.  History of vitamin D deficiency: She is taking vitamin D 2000 units daily.  Primary insomnia - She is taking trazodone 50-150 mg at bedtime as needed for insomnia.  History of hypertension: Blood pressure is 137/78 today in the office.  Orders: Orders Placed This Encounter  Procedures   CBC with Differential/Platelet   COMPLETE METABOLIC PANEL WITH GFR   No orders of the defined types were placed in this encounter.  Follow-Up Instructions: Return in about 8 weeks (around 04/21/2022) for Rheumatoid arthritis, Osteoarthritis.   Ofilia Neas, PA-C  Note - This record has been created using Dragon  software.  Chart creation errors have been sought, but may not always  have been located. Such creation errors do not reflect on  the standard of medical care.

## 2022-02-24 ENCOUNTER — Other Ambulatory Visit: Payer: Self-pay

## 2022-02-24 ENCOUNTER — Encounter: Payer: Self-pay | Admitting: Physician Assistant

## 2022-02-24 ENCOUNTER — Ambulatory Visit: Payer: BC Managed Care – PPO | Admitting: Physician Assistant

## 2022-02-24 VITALS — BP 137/78 | HR 70 | Ht 70.0 in | Wt 216.6 lb

## 2022-02-24 DIAGNOSIS — M0579 Rheumatoid arthritis with rheumatoid factor of multiple sites without organ or systems involvement: Secondary | ICD-10-CM | POA: Diagnosis not present

## 2022-02-24 DIAGNOSIS — M19041 Primary osteoarthritis, right hand: Secondary | ICD-10-CM

## 2022-02-24 DIAGNOSIS — Z79899 Other long term (current) drug therapy: Secondary | ICD-10-CM | POA: Diagnosis not present

## 2022-02-24 DIAGNOSIS — M19071 Primary osteoarthritis, right ankle and foot: Secondary | ICD-10-CM

## 2022-02-24 DIAGNOSIS — F5101 Primary insomnia: Secondary | ICD-10-CM

## 2022-02-24 DIAGNOSIS — M19042 Primary osteoarthritis, left hand: Secondary | ICD-10-CM

## 2022-02-24 DIAGNOSIS — M1612 Unilateral primary osteoarthritis, left hip: Secondary | ICD-10-CM

## 2022-02-24 DIAGNOSIS — M19072 Primary osteoarthritis, left ankle and foot: Secondary | ICD-10-CM

## 2022-02-24 DIAGNOSIS — Z8679 Personal history of other diseases of the circulatory system: Secondary | ICD-10-CM

## 2022-02-24 DIAGNOSIS — Z8639 Personal history of other endocrine, nutritional and metabolic disease: Secondary | ICD-10-CM

## 2022-02-24 NOTE — Patient Instructions (Addendum)
Standing Labs We placed an order today for your standing lab work.   Please have your standing labs drawn in 1 month then every 3 months   If possible, please have your labs drawn 2 weeks prior to your appointment so that the provider can discuss your results at your appointment.  Please note that you may see your imaging and lab results in [place] before we have reviewed them. We may be awaiting multiple results to interpret others before contacting you. Please allow our office up to 72 hours to thoroughly review all of the results before contacting the office for clarification of your results.  We have open lab daily: Monday through Thursday from 1:30-4:30 PM and Friday from 1:30-4:00 PM at the office of Dr. Bo Merino, [facility].   Please be advised, all patients with office appointments requiring lab work will take precedent over walk-in lab work.  If possible, please come for your lab work on Monday and Friday afternoons, as you may experience shorter wait times. The office is located at [address] [place], [place], [place] [address] No appointment is necessary.   Labs are drawn by [facility]. Please bring your co-pay at the time of your lab draw.  You may receive a bill from [place] for your lab work.  Please note if you are on Hydroxychloroquine and and an order has been placed for a Hydroxychloroquine level, you will need to have it drawn 4 hours or more after your last dose.  If you wish to have your labs drawn at another location, please call the office 24 hours in advance to send orders.  If you have any questions regarding directions or hours of operation,  please call [phone].   As a reminder, please drink plenty of water prior to coming for your lab work. Thanks!  Sulfasalazine tablets What is this medication? SULFASALAZINE (sul fa SAL a zeen) is used to treat ulcerative colitis. This medicine may be used for other purposes; ask your health care  provider or pharmacist if you have questions. COMMON BRAND NAME(S): Azulfidine, Sulfazine What should I tell my care team before I take this medication? They need to know if you have any of these conditions: asthma blood disorders or anemia glucose-6-phosphate dehydrogenase (G6PD) deficiency intestinal obstruction kidney disease liver disease porphyria urinary tract obstruction an unusual reaction to sulfasalazine, sulfa drugs, salicylates, or other medicines, foods, dyes, or preservatives pregnant or trying to get pregnant breast-feeding How should I use this medication? Take this medicine by mouth with a full glass of water. Follow the directions on the prescription label. If the medicine upsets your stomach, take it with food or milk. Take your medicine at regular intervals. Do not take your medicine more often than directed. Do not stop taking except on your doctor's advice. Talk to your pediatrician regarding the use of this medicine in children. While this drug may be prescribed for children as young as 6 years for selected conditions, precautions do apply. Patients over 43 years old may have a stronger reaction and need a smaller dose. Overdosage: If you think you have taken too much of this medicine contact a poison control center or emergency room at once. NOTE: This medicine is only for you. Do not share this medicine with others. What if I miss a dose? If you miss a dose, take it as soon as you can. If it is almost time for your next dose, take only that dose. Do not take double or  extra doses. What may interact with this medication? digoxin folic acid This list may not describe all possible interactions. Give your health care provider a list of all the medicines, herbs, non-prescription drugs, or dietary supplements you use. Also tell them if you smoke, drink alcohol, or use illegal drugs. Some items may interact with your medicine. What should I watch for while using this  medication? Visit your doctor or health care professional for regular checks on your progress. You will need frequent blood and urine checks. This medicine can make you more sensitive to the sun. Keep out of the sun. If you cannot avoid being in the sun, wear protective clothing and use sunscreen. Do not use sun lamps or tanning beds/booths. Drink plenty of water while taking this medicine. What side effects may I notice from receiving this medication? Side effects that you should report to your doctor or health care professional as soon as possible: allergic reactions like skin rash, itching or hives, swelling of the face, lips, or tongue fever, chills, or any other sign of infection painful, difficult, or reduced urination redness, blistering, peeling or loosening of the skin, including inside the mouth severe stomach pain unusual bleeding or bruising unusually weak or tired yellowing of the skin or eyes Side effects that usually do not require medical attention (report to your doctor or health care professional if they continue or are bothersome): headache loss of appetite nausea, vomiting orange color to the urine reduced sperm count This list may not describe all possible side effects. Call your doctor for medical advice about side effects. You may report side effects to FDA at 1-800-FDA-1088. Where should I keep my medication? Keep out of the reach of children. Store at room temperature between 15 and 30 degrees C (59 and 86 degrees F). Keep container tightly closed. Throw away any unused medicine after the expiration date. NOTE: This sheet is a summary. It may not cover all possible information. If you have questions about this medicine, talk to your doctor, pharmacist, or health care provider.  2022 Elsevier/Gold Standard (2008-09-01 00:00:00)

## 2022-02-24 NOTE — Telephone Encounter (Signed)
Per Sherron Ales, PA-C, patient will be starting SSZ pending lab results. Thanks!

## 2022-02-24 NOTE — Progress Notes (Signed)
Pharmacy Note  Subjective:  Patient presents today to [facility] for follow up office visit.  Patient seen by pharmacist for counseling on sulfasalazine for rheumatoid arthritis.  Prior therapy includes:hydroxychloroquine (stopped per ophthalmology rec), leflunomide (hair loss and inadequate response), methotrexate (fatigue, nausea, brain fog). She currently takes Enbrel 50mg  SQ weekly  Objective: CMP     Component Value Date/Time   NA 139 12/31/2021 1212   K 4.3 12/31/2021 1212   CL 104 12/31/2021 1212   CO2 29 12/31/2021 1212   GLUCOSE 84 12/31/2021 1212   BUN 7 12/31/2021 1212   CREATININE 0.83 12/31/2021 1212   CALCIUM 9.9 12/31/2021 1212   PROT 7.9 12/31/2021 1212   ALBUMIN 3.7 08/24/2017 1046   AST 29 12/31/2021 1212   ALT 44 (H) 12/31/2021 1212   ALKPHOS 45 08/24/2017 1046   BILITOT 0.4 12/31/2021 1212   GFRNONAA 71 07/03/2021 1538   GFRAA 82 07/03/2021 1538    CBC    Component Value Date/Time   WBC 5.5 12/31/2021 1212   RBC 4.64 12/31/2021 1212   HGB 13.3 12/31/2021 1212   HCT 41.3 12/31/2021 1212   PLT 242 12/31/2021 1212   MCV 89.0 12/31/2021 1212   MCH 28.7 12/31/2021 1212   MCHC 32.2 12/31/2021 1212   RDW 12.4 12/31/2021 1212   LYMPHSABS 2,937 12/31/2021 1212   MONOABS 450 08/24/2017 1046   EOSABS 143 12/31/2021 1212   BASOSABS 50 12/31/2021 1212     Baseline Immunosuppressant Therapy Labs TB GOLD Quantiferon TB Gold Latest Ref Rng & Units 08/16/2021  Quantiferon TB Gold Plus NEGATIVE NEGATIVE   Hepatitis Panel Hepatitis Latest Ref Rng & Units 05/19/2017  Hep B Surface Ag NEGATIVE NEGATIVE  Hep B IgM NON REACTIVE NON REACTIVE  Hep C Ab NEGATIVE NEGATIVE  Hep A IgM NON REACTIVE NON REACTIVE   HIV Lab Results  Component Value Date   HIV NONREACTIVE 05/19/2017   Immunoglobulins Immunoglobulin Electrophoresis Latest Ref Rng & Units 05/19/2017  IgG 694 - 1,618 mg/dL 05/21/2017)  IgM 48 - 1,364(B mg/dL 837   SPEP Serum Protein  Electrophoresis Latest Ref Rng & Units 12/31/2021  Total Protein 6.1 - 8.1 g/dL 7.9  Albumin 3.8 - 4.8 g/dL -  Alpha-1 0.2 - 0.3 g/dL -  Alpha-2 0.5 - 0.9 g/dL -  Beta Globulin 0.4 - 0.6 g/dL -  Beta 2 0.2 - 0.5 g/dL -  Gamma Globulin 0.8 - 1.7 g/dL -  Interpretation - -   G6PD Lab Results  Component Value Date   G6PDH 18.6 05/19/2017   Chest x-ray: 05/27/17 - No active cardiopulmonary disease.  Does the patient have an allergy to sulfa drugs? No  Assessment/Plan: Patient was counseled on the purpose, proper use, and adverse effects of sulfasalazine including risk of infection and chance of nausea, headache, and sun sensitivity.  Also discussed risk of skin rash and advised patient to stop the medication and let 05/29/17 know if she develops a rash. Also discussed for the potential of discoloration of the urine, sweat, or tears.  Advised patient to avoid live vaccines.  Recommend annual influenza, Pneumovax 23, Prevnar 13, and Shingrix as indicated.   Reviewed the importance of frequent labs to monitor liver, kidneys, and blood counts.  Standing orders placed and patient to return 1 month after starting therapy and then every 3 months.  Provided patient with educational materials on sulfasalazine and answered all questions.  Patient consented to sulfasalazine use, and consent will be uploaded into the  media tab.    Patient dose will be sulfasalazine 1000MG  twice daily.  Prescription will be sent to pharmacy pending lab results. I advised that she can start at 1 tab (500mg ) twice daily x 1 week then increase if she thinks she will have GI side effects. She verbalized understanding  She will continue Enbrel 50mg  SQ weekly  , PharmD, MPH, BCPS Clinical Pharmacist (Rheumatology and Pulmonology)

## 2022-02-25 LAB — CBC WITH DIFFERENTIAL/PLATELET
Absolute Monocytes: 592 cells/uL (ref 200–950)
Basophils Absolute: 43 cells/uL (ref 0–200)
Basophils Relative: 0.7 %
Eosinophils Absolute: 140 cells/uL (ref 15–500)
Eosinophils Relative: 2.3 %
HCT: 40.4 % (ref 35.0–45.0)
Hemoglobin: 13.1 g/dL (ref 11.7–15.5)
Lymphs Abs: 2721 cells/uL (ref 850–3900)
MCH: 28.7 pg (ref 27.0–33.0)
MCHC: 32.4 g/dL (ref 32.0–36.0)
MCV: 88.6 fL (ref 80.0–100.0)
MPV: 10.5 fL (ref 7.5–12.5)
Monocytes Relative: 9.7 %
Neutro Abs: 2605 cells/uL (ref 1500–7800)
Neutrophils Relative %: 42.7 %
Platelets: 234 10*3/uL (ref 140–400)
RBC: 4.56 10*6/uL (ref 3.80–5.10)
RDW: 12.3 % (ref 11.0–15.0)
Total Lymphocyte: 44.6 %
WBC: 6.1 10*3/uL (ref 3.8–10.8)

## 2022-02-25 LAB — COMPLETE METABOLIC PANEL WITH GFR
AG Ratio: 1.2 (calc) (ref 1.0–2.5)
ALT: 102 U/L — ABNORMAL HIGH (ref 6–29)
AST: 62 U/L — ABNORMAL HIGH (ref 10–35)
Albumin: 4.2 g/dL (ref 3.6–5.1)
Alkaline phosphatase (APISO): 69 U/L (ref 37–153)
BUN: 10 mg/dL (ref 7–25)
CO2: 26 mmol/L (ref 20–32)
Calcium: 9.7 mg/dL (ref 8.6–10.4)
Chloride: 104 mmol/L (ref 98–110)
Creat: 0.81 mg/dL (ref 0.50–1.03)
Globulin: 3.4 g/dL (calc) (ref 1.9–3.7)
Glucose, Bld: 132 mg/dL — ABNORMAL HIGH (ref 65–99)
Potassium: 4.3 mmol/L (ref 3.5–5.3)
Sodium: 138 mmol/L (ref 135–146)
Total Bilirubin: 0.4 mg/dL (ref 0.2–1.2)
Total Protein: 7.6 g/dL (ref 6.1–8.1)
eGFR: 86 mL/min/{1.73_m2} (ref >=60)

## 2022-02-25 MED ORDER — SULFASALAZINE 500 MG PO TABS: ORAL_TABLET | ORAL | 0 refills | Status: DC

## 2022-02-25 NOTE — Telephone Encounter (Signed)
Patient advised to repeat CBC and CMP in 2 weeks

## 2022-02-25 NOTE — Progress Notes (Signed)
CBC WNL.glucose is 132.  LFTs are very elevated and have trended up. Dr. Corliss Skains recommends stopping tylenol and tumeric.

## 2022-02-25 NOTE — Telephone Encounter (Signed)
CBC WNL.glucose is 132.  LFTs are very elevated and have trended up. Dr. Corliss Skains recommends stopping tylenol and tumeric.   sulfasalazine 1000MG  twice daily.  Prescription will be sent to pharmacy pending lab results. I advised that Shelley Morrison can start at 1 tab (500mg ) twice daily x 1 week then increase if Shelley Morrison thinks Shelley Morrison will have GI side effects.

## 2022-02-25 NOTE — Telephone Encounter (Signed)
Repeat CBC and CMP in 2 weeks.

## 2022-03-12 ENCOUNTER — Other Ambulatory Visit: Payer: Self-pay | Admitting: *Deleted

## 2022-03-12 DIAGNOSIS — Z79899 Other long term (current) drug therapy: Secondary | ICD-10-CM | POA: Diagnosis not present

## 2022-03-13 LAB — COMPLETE METABOLIC PANEL WITH GFR
AG Ratio: 1.3 (calc) (ref 1.0–2.5)
ALT: 76 U/L — ABNORMAL HIGH (ref 6–29)
AST: 37 U/L — ABNORMAL HIGH (ref 10–35)
Albumin: 4.2 g/dL (ref 3.6–5.1)
Alkaline phosphatase (APISO): 74 U/L (ref 37–153)
BUN: 9 mg/dL (ref 7–25)
CO2: 30 mmol/L (ref 20–32)
Calcium: 9.5 mg/dL (ref 8.6–10.4)
Chloride: 103 mmol/L (ref 98–110)
Creat: 0.96 mg/dL (ref 0.50–1.03)
Globulin: 3.2 g/dL (calc) (ref 1.9–3.7)
Glucose, Bld: 69 mg/dL (ref 65–99)
Potassium: 4.1 mmol/L (ref 3.5–5.3)
Sodium: 138 mmol/L (ref 135–146)
Total Bilirubin: 0.3 mg/dL (ref 0.2–1.2)
Total Protein: 7.4 g/dL (ref 6.1–8.1)
eGFR: 70 mL/min/{1.73_m2} (ref >=60)

## 2022-03-13 LAB — CBC WITH DIFFERENTIAL/PLATELET
Absolute Monocytes: 832 cells/uL (ref 200–950)
Basophils Absolute: 30 cells/uL (ref 0–200)
Basophils Relative: 0.5 %
Eosinophils Absolute: 171 cells/uL (ref 15–500)
Eosinophils Relative: 2.9 %
HCT: 38.3 % (ref 35.0–45.0)
Hemoglobin: 12.6 g/dL (ref 11.7–15.5)
Lymphs Abs: 2395 cells/uL (ref 850–3900)
MCH: 29.5 pg (ref 27.0–33.0)
MCHC: 32.9 g/dL (ref 32.0–36.0)
MCV: 89.7 fL (ref 80.0–100.0)
MPV: 10.6 fL (ref 7.5–12.5)
Monocytes Relative: 14.1 %
Neutro Abs: 2472 cells/uL (ref 1500–7800)
Neutrophils Relative %: 41.9 %
Platelets: 220 10*3/uL (ref 140–400)
RBC: 4.27 10*6/uL (ref 3.80–5.10)
RDW: 12.4 % (ref 11.0–15.0)
Total Lymphocyte: 40.6 %
WBC: 5.9 10*3/uL (ref 3.8–10.8)

## 2022-03-13 NOTE — Progress Notes (Signed)
CBC in normal. LFTs are better but still elevated.We will recheck labs in 3 months.

## 2022-03-20 ENCOUNTER — Other Ambulatory Visit: Payer: Self-pay | Admitting: Physician Assistant

## 2022-03-20 NOTE — Telephone Encounter (Signed)
Please clarify which instructions are correct. Is the patient taking SSZ 500 mg 2 tablets BID?

## 2022-03-20 NOTE — Telephone Encounter (Signed)
Next Visit: 04/24/2022 ? ?Last Visit: 02/24/2022 ? ?Last Fill: 02/25/2022 ? ?DX: Seropositive rheumatoid arthritis of multiple sites  ? ?Current Dose per office note 02/24/2022: sulfasalazine 1000MG  twice daily ? ?Labs: 03/12/2022 CBC in normal. LFTs are better but still elevated.We will recheck labs in 3 months. ? ?Okay to refill SSZ?  ?

## 2022-03-20 NOTE — Telephone Encounter (Signed)
Spoke with patient and she states she is taking 2 tablets twice daily.  ?

## 2022-04-10 NOTE — Progress Notes (Signed)
? ?Office Visit Note ? ?Patient: Shelley Morrison             ?Date of Birth: [birth year]           ?MRN: [id number]             ?PCP: Shelley Crease, MD ?Referring: Shelley Crease, MD ?Visit Date: 04/24/2022 ?Occupation: @GUAROCC @ ? ?Subjective:  ?Medication management ? ?History of Present Illness: Shelley Morrison is a 56 y.o. female with history of rheumatoid arthritis and osteoarthritis.  She states she is doing much better on combination of Enbrel and sulfasalazine.  She states she was taking Tylenol prior to starting sulfasalazine.  She has a stopped taking Tylenol now.  She has not noticed any joint swelling since she started sulfasalazine.  She continues to have some joint to stiffness. ? ?Activities of Daily Living:  ?Patient reports morning stiffness for 10 minutes.   ?Patient Denies nocturnal pain.  ?Difficulty dressing/grooming: Denies ?Difficulty climbing stairs: Denies ?Difficulty getting out of chair: Denies ?Difficulty using hands for taps, buttons, cutlery, and/or writing: Denies ? ?Review of Systems  ?Constitutional:  Positive for fatigue.  ?HENT:  Negative for mouth dryness.   ?Eyes:  Negative for dryness.  ?Respiratory:  Negative for shortness of breath.   ?Cardiovascular:  Negative for swelling in legs/feet.  ?Gastrointestinal:  Negative for constipation.  ?Endocrine: Positive for cold intolerance and heat intolerance.  ?Genitourinary:  Negative for difficulty urinating.  ?Musculoskeletal:  Positive for morning stiffness. Negative for joint pain, gait problem and joint pain.  ?Skin:  Negative for rash.  ?Allergic/Immunologic: Negative for susceptible to infections.  ?Neurological:  Negative for numbness.  ?Hematological:  Negative for bruising/bleeding tendency.  ?Psychiatric/Behavioral:  Positive for sleep disturbance.   ? ?PMFS History:  ?Patient Active Problem List  ? Diagnosis Date Noted  ? Rheumatoid arthritis with rheumatoid factor of multiple sites without organ or systems involvement  (HCC) 06/19/2017  ? High risk medication use 06/18/2017  ? Vitamin D deficiency 06/18/2017  ? Primary insomnia 05/19/2017  ? Essential hypertension 05/19/2017  ? ANA positive 05/08/2017  ? Rheumatoid factor positive 05/08/2017  ?  ?Past Medical History:  ?Diagnosis Date  ? Hypertension   ? Lupus (HCC)   ? Rheumatoid arthritis (HCC)   ?  ?Family History  ?Problem Relation Age of Onset  ? Healthy Son   ? Healthy Daughter   ? ?Past Surgical History:  ?Procedure Laterality Date  ? CHOLECYSTECTOMY    ? ?Social History  ? ?Social History Narrative  ? Not on file  ? ?Immunization History  ?Administered Date(s) Administered  ? PFIZER(Purple Top)SARS-COV-2 Vaccination 09/24/2020, 10/15/2020  ?  ? ?Objective: ?Vital Signs: BP (!) 151/84 (BP Location: Right Arm, Patient Position: Sitting, Cuff Size: Large)   Pulse 69   Resp 12   Ht 5\' 10"  (1.778 m)   Wt 211 lb (95.7 kg)   BMI 30.28 kg/m?   ? ?Physical Exam ?Vitals and nursing note reviewed.  ?Constitutional:   ?   Appearance: She is well-developed.  ?HENT:  ?   Head: Normocephalic and atraumatic.  ?Eyes:  ?   Conjunctiva/sclera: Conjunctivae normal.  ?Cardiovascular:  ?   Rate and Rhythm: Normal rate and regular rhythm.  ?   Heart sounds: Normal heart sounds.  ?Pulmonary:  ?   Effort: Pulmonary effort is normal.  ?   Breath sounds: Normal breath sounds.  ?Abdominal:  ?   General: Bowel sounds are normal.  ?   Palpations: Abdomen  is soft.  ?Musculoskeletal:  ?   Cervical back: Normal range of motion.  ?Lymphadenopathy:  ?   Cervical: No cervical adenopathy.  ?Skin: ?   General: Skin is warm and dry.  ?   Capillary Refill: Capillary refill takes less than 2 seconds.  ?Neurological:  ?   Mental Status: She is alert and oriented to person, place, and time.  ?Psychiatric:     ?   Behavior: Behavior normal.  ?  ? ?Musculoskeletal Exam: C-spine was in good range of motion.  Shoulder joints, elbow joints, wrist joints, MCPs PIPs and DIPs with good range of motion with no  synovitis.  She has some limitation with range of motion of her left hip.  Right hip joint and knee joints with good range of motion with no warmth swelling or effusion.  She had no tenderness over ankles or MTPs. ? ?CDAI Exam: ?CDAI Score: 0.4  ?Patient Global: 2 mm; Provider Global: 2 mm ?Swollen: 0 ; Tender: 0  ?Joint Exam 04/24/2022  ? ?No joint exam has been documented for this visit  ? ?There is currently no information documented on the homunculus. Go to the Rheumatology activity and complete the homunculus joint exam. ? ?Investigation: ?No additional findings. ? ?Imaging: ?No results found. ? ?Recent Labs: ?Lab Results  ?Component Value Date  ? WBC 5.9 03/12/2022  ? HGB 12.6 03/12/2022  ? PLT 220 03/12/2022  ? NA 138 03/12/2022  ? K 4.1 03/12/2022  ? CL 103 03/12/2022  ? CO2 30 03/12/2022  ? GLUCOSE 69 03/12/2022  ? BUN 9 03/12/2022  ? CREATININE 0.96 03/12/2022  ? BILITOT 0.3 03/12/2022  ? ALKPHOS 45 08/24/2017  ? AST 37 (H) 03/12/2022  ? ALT 76 (H) 03/12/2022  ? PROT 7.4 03/12/2022  ? ALBUMIN 3.7 08/24/2017  ? CALCIUM 9.5 03/12/2022  ? GFRAA 82 07/03/2021  ? QFTBGOLDPLUS NEGATIVE 08/16/2021  ? ? ?Speciality Comments: PLQ Eye Exam:11/25/2021 WNL @ [facility] Follow up in 6 months, methotrexate-fatigue nausea and brain fog ?Arava-hair loss, inadequate response ? ?Procedures:  ?No procedures performed ?Allergies: Acetaminophen-codeine  ? ?Assessment / Plan:     ?Visit Diagnoses: Seropositive rheumatoid arthritis of multiple sites (HCC)-she has been tolerating Enbrel 50 mg subcu weekly and sulfasalazine 500 mg 2 tablets p.o. twice daily without any side effects.  She had no synovitis on examination today.  She continues to have some joint stiffness but no joint swelling palpation. ? ?High risk medication use - sulfasalazine 500 mg 2 tablets twice daily, Enbrel 50 mg subcutaneous injections once weekly.  We will check labs in June and every 3 months to monitor for drug toxicity.  We will check LFTs  today.  LFT elevation is most likely related to Tylenol intake.  TB gold was negative on August 16, 2021.  She was advised to stop Enbrel and sulfasalazine in case she develops an infection and resume after the infection resolves.  Information about immunization was placed in the AVS.  She was advised to get annual skin examination to screen for skin cancer.  Use of sunscreen was also advised. ? ?Elevated LFTs-her LFTs were high in February which improved in March.  She believes the elevated LFTs were most likely related to Tylenol intake.  We will repeat LFTs today.  If LFTs are stable then we will continue current dose of sulfasalazine.  Plan: AST, ALT ? ?Primary osteoarthritis of both hands-she has mild DIP and PIP thickening with no synovitis was noted. ? ?Primary osteoarthritis  of left hip-she has limitation of motion of her left hip joint. ? ?Primary osteoarthritis of both feet-no synovitis was noted. ? ?History of vitamin D deficiency-she has been taking vitamin D supplement. ? ?Primary insomnia-good sleep hygiene was discussed. ? ?History of hypertension-her blood pressure was slightly elevated today.  She was advised to monitor blood pressure closely.  Increased risk of heart disease with rheumatoid arthritis was discussed.  Dietary modifications and exercises were discussed.  She has been going to the gym on a regular basis.  A handout was placed in the AVS. ? ?Orders: ?Orders Placed This Encounter  ?Procedures  ? AST  ? ALT  ? ?No orders of the defined types were placed in this encounter. ? ? ? ?Follow-Up Instructions: Return for Rheumatoid arthritis. ? ? ?Pollyann SavoyShaili Najai Waszak, MD ? ?Note - This record has been created using [employer] software.  ?Chart creation errors have been sought, but may not always  ?have been located. Such creation errors do not reflect on  ?the standard of medical care.  ?

## 2022-04-21 ENCOUNTER — Other Ambulatory Visit: Payer: Self-pay | Admitting: Physician Assistant

## 2022-04-21 DIAGNOSIS — M0579 Rheumatoid arthritis with rheumatoid factor of multiple sites without organ or systems involvement: Secondary | ICD-10-CM

## 2022-04-21 NOTE — Telephone Encounter (Signed)
Next Visit: 04/24/2022 ?  ?Last Visit: 02/24/2022 ?  ?Last Fill: 01/28/2022 ? ?DX: Seropositive rheumatoid arthritis of multiple sites  ?  ?Current Dose per office note 02/24/2022: Enbrel 50 mg subcutaneous injections once weekly ? ?Labs: 03/12/2022 CBC in normal. LFTs are better but still elevated.We will recheck labs in 3 months. ? ?TB Gold: 08/16/2021 Neg  ? ?Okay to refill Enbrel?  ?

## 2022-04-24 ENCOUNTER — Ambulatory Visit: Payer: BC Managed Care – PPO | Admitting: Rheumatology

## 2022-04-24 ENCOUNTER — Encounter: Payer: Self-pay | Admitting: Rheumatology

## 2022-04-24 VITALS — BP 151/84 | HR 69 | Resp 12 | Ht 70.0 in | Wt 211.0 lb

## 2022-04-24 DIAGNOSIS — R7989 Other specified abnormal findings of blood chemistry: Secondary | ICD-10-CM | POA: Diagnosis not present

## 2022-04-24 DIAGNOSIS — Z79899 Other long term (current) drug therapy: Secondary | ICD-10-CM

## 2022-04-24 DIAGNOSIS — Z8679 Personal history of other diseases of the circulatory system: Secondary | ICD-10-CM

## 2022-04-24 DIAGNOSIS — M19042 Primary osteoarthritis, left hand: Secondary | ICD-10-CM

## 2022-04-24 DIAGNOSIS — M19071 Primary osteoarthritis, right ankle and foot: Secondary | ICD-10-CM

## 2022-04-24 DIAGNOSIS — M19072 Primary osteoarthritis, left ankle and foot: Secondary | ICD-10-CM

## 2022-04-24 DIAGNOSIS — M0579 Rheumatoid arthritis with rheumatoid factor of multiple sites without organ or systems involvement: Secondary | ICD-10-CM | POA: Diagnosis not present

## 2022-04-24 DIAGNOSIS — M19041 Primary osteoarthritis, right hand: Secondary | ICD-10-CM | POA: Diagnosis not present

## 2022-04-24 DIAGNOSIS — Z8639 Personal history of other endocrine, nutritional and metabolic disease: Secondary | ICD-10-CM

## 2022-04-24 DIAGNOSIS — M1612 Unilateral primary osteoarthritis, left hip: Secondary | ICD-10-CM

## 2022-04-24 DIAGNOSIS — F5101 Primary insomnia: Secondary | ICD-10-CM

## 2022-04-24 NOTE — Patient Instructions (Addendum)
Standing Labs ?We placed an order today for your standing lab work.  ? ?Please have your standing labs drawn in June and every 3 months ? ?If possible, please have your labs drawn 2 weeks prior to your appointment so that the provider can discuss your results at your appointment. ? ?Please note that you may see your imaging and lab results in MyChart before we have reviewed them. ?We may be awaiting multiple results to interpret others before contacting you. ?Please allow our office up to 72 hours to thoroughly review all of the results before contacting the office for clarification of your results. ? ?We have open lab daily: ?Monday through Thursday from 1:30-4:30 PM and Friday from 1:30-4:00 PM ?at the office of Dr. Josiephine Simao, [facility].   ?Please be advised, all patients with office appointments requiring lab work will take precedent over walk-in lab work.  ?If possible, please come for your lab work on Monday and Friday afternoons, as you may experience shorter wait times. ?The office is located at [address], [address], [place], [place] [address] ?No appointment is necessary.   ?Labs are drawn by [facility]. Please bring your co-pay at the time of your lab draw.  You may receive a bill from [facility] for your lab work. ? ?Please note if you are on Hydroxychloroquine and and an order has been placed for a Hydroxychloroquine level, you will need to have it drawn 4 hours or more after your last dose. ? ?If you wish to have your labs drawn at another location, please call the office 24 hours in advance to send orders. ? ?If you have any questions regarding directions or hours of operation,  ?please call [phone].   ?As a reminder, please drink plenty of water prior to coming for your lab work. Thanks!  ? ?Vaccines ?You are taking a medication(s) that can suppress your immune system.  The following immunizations are recommended: ?Flu annually ?Covid-19  ?Td/Tdap (tetanus, diphtheria, pertussis)  every 10 years ?Pneumonia (Prevnar 15 then Pneumovax 23 at least 1 year apart.  Alternatively, can take Prevnar 20 without needing additional dose) ?Shingrix: 2 doses from 4 weeks to 6 months apart ? ?Please check with your PCP to make sure you are up to date.  ? ?If you have signs or symptoms of an infection or start antibiotics: ?First, call your PCP for workup of your infection. ?Hold your medication through the infection, until you complete your antibiotics, and until symptoms resolve if you take the following: ?Injectable medication (Actemra, Benlysta, Cimzia, Cosentyx, Enbrel, Humira, Kevzara, Orencia, Remicade, Simponi, Stelara, Taltz, Tremfya) ?Methotrexate ?Leflunomide (Arava) ?Mycophenolate (Cellcept) ?Xeljanz, Olumiant, or Rinvoq  ? ? ?Heart Disease Prevention  ?Your inflammatory disease increases your risk of heart disease which includes heart attack, stroke, atrial fibrillation (irregular heartbeats), high blood pressure, heart failure and atherosclerosis (plaque in the arteries).  It is important to reduce your risk by:  ? ?Keep blood pressure, cholesterol, and blood sugar at healthy levels  ? ?Smoking Cessation  ? ?Maintain a healthy weight  ?BMI 20-25  ? ?Eat a healthy diet  ?Plenty of fresh fruit, vegetables, and whole grains  ?Limit saturated fats, foods high in sodium, and added sugars  ?DASH and Mediterranean diet  ? ?Increase physical activity  ?Recommend moderate physically activity for 150 minutes per week/ 30 minutes a day for five days a week These can be broken up into three separate ten-minute sessions during the day.  ? ?Reduce Stress  ?Meditation,   slow breathing exercises, yoga, coloring books  ?Dental visits twice a year   ?

## 2022-04-25 LAB — ALT: ALT: 54 U/L — ABNORMAL HIGH (ref 6–29)

## 2022-04-25 LAB — AST: AST: 31 U/L (ref 10–35)

## 2022-04-25 NOTE — Progress Notes (Signed)
LFTs are improving.  AST is back to normal.  ALT is a still elevated.

## 2022-05-02 ENCOUNTER — Ambulatory Visit: Payer: BC Managed Care – PPO | Admitting: Rheumatology

## 2022-05-05 ENCOUNTER — Ambulatory Visit: Payer: BC Managed Care – PPO | Admitting: Rheumatology

## 2022-05-24 ENCOUNTER — Emergency Department (HOSPITAL_BASED_OUTPATIENT_CLINIC_OR_DEPARTMENT_OTHER)
Admission: EM | Admit: 2022-05-24 | Discharge: 2022-05-24 | Disposition: A | Discharge status: Home / Self Care | Payer: BC Managed Care – PPO | Attending: Emergency Medicine | Admitting: Emergency Medicine

## 2022-05-24 ENCOUNTER — Other Ambulatory Visit: Payer: Self-pay

## 2022-05-24 ENCOUNTER — Emergency Department (HOSPITAL_BASED_OUTPATIENT_CLINIC_OR_DEPARTMENT_OTHER): Payer: BC Managed Care – PPO

## 2022-05-24 ENCOUNTER — Encounter (HOSPITAL_BASED_OUTPATIENT_CLINIC_OR_DEPARTMENT_OTHER): Payer: Self-pay

## 2022-05-24 DIAGNOSIS — Z79899 Other long term (current) drug therapy: Secondary | ICD-10-CM | POA: Diagnosis not present

## 2022-05-24 DIAGNOSIS — J101 Influenza due to other identified influenza virus with other respiratory manifestations: Secondary | ICD-10-CM | POA: Diagnosis not present

## 2022-05-24 DIAGNOSIS — Z20822 Contact with and (suspected) exposure to covid-19: Secondary | ICD-10-CM | POA: Insufficient documentation

## 2022-05-24 DIAGNOSIS — R509 Fever, unspecified: Secondary | ICD-10-CM | POA: Diagnosis not present

## 2022-05-24 DIAGNOSIS — R059 Cough, unspecified: Secondary | ICD-10-CM | POA: Diagnosis not present

## 2022-05-24 DIAGNOSIS — I10 Essential (primary) hypertension: Secondary | ICD-10-CM | POA: Diagnosis not present

## 2022-05-24 LAB — URINALYSIS, ROUTINE W REFLEX MICROSCOPIC
Bilirubin Urine: NEGATIVE
Glucose, UA: NEGATIVE mg/dL
Hgb urine dipstick: NEGATIVE
Ketones, ur: NEGATIVE mg/dL
Leukocytes,Ua: NEGATIVE
Nitrite: NEGATIVE
Protein, ur: 100 mg/dL — AB
Specific Gravity, Urine: 1.026 (ref 1.005–1.030)
pH: 6 (ref 5.0–8.0)

## 2022-05-24 LAB — COMPREHENSIVE METABOLIC PANEL
ALT: 96 U/L — ABNORMAL HIGH (ref 0–44)
AST: 73 U/L — ABNORMAL HIGH (ref 15–41)
Albumin: 4.1 g/dL (ref 3.5–5.0)
Alkaline Phosphatase: 65 U/L (ref 38–126)
Anion gap: 10 (ref 5–15)
BUN: 9 mg/dL (ref 6–20)
CO2: 24 mmol/L (ref 22–32)
Calcium: 9 mg/dL (ref 8.9–10.3)
Chloride: 102 mmol/L (ref 98–111)
Creatinine, Ser: 0.91 mg/dL (ref 0.44–1.00)
GFR, Estimated: >60 mL/min (ref >60)
Glucose, Bld: 147 mg/dL — ABNORMAL HIGH (ref 70–99)
Potassium: 3.7 mmol/L (ref 3.5–5.1)
Sodium: 136 mmol/L (ref 135–145)
Total Bilirubin: 0.4 mg/dL (ref 0.3–1.2)
Total Protein: 7.5 g/dL (ref 6.5–8.1)

## 2022-05-24 LAB — RESP PANEL BY RT-PCR (FLU A&B, COVID) ARPGX2
Influenza A by PCR: POSITIVE — AB
Influenza B by PCR: NEGATIVE
SARS Coronavirus 2 by RT PCR: NEGATIVE

## 2022-05-24 LAB — CBC
HCT: 41 % (ref 36.0–46.0)
Hemoglobin: 12.9 g/dL (ref 12.0–15.0)
MCH: 28.7 pg (ref 26.0–34.0)
MCHC: 31.5 g/dL (ref 30.0–36.0)
MCV: 91.1 fL (ref 80.0–100.0)
Platelets: 186 10*3/uL (ref 150–400)
RBC: 4.5 MIL/uL (ref 3.87–5.11)
RDW: 13.2 % (ref 11.5–15.5)
WBC: 3.5 10*3/uL — ABNORMAL LOW (ref 4.0–10.5)
nRBC: 0 % (ref 0.0–0.2)

## 2022-05-24 LAB — LIPASE, BLOOD: Lipase: 34 U/L (ref 11–51)

## 2022-05-24 LAB — PREGNANCY, URINE: Preg Test, Ur: NEGATIVE

## 2022-05-24 MED ORDER — ONDANSETRON 4 MG PO TBDP
4.0000 mg | ORAL_TABLET | Freq: Once | ORAL | Status: AC
  Administered 2022-05-24: 4 mg via ORAL
  Filled 2022-05-24: qty 1

## 2022-05-24 MED ORDER — NAPROXEN 250 MG PO TABS
500.0000 mg | ORAL_TABLET | Freq: Once | ORAL | Status: AC
  Administered 2022-05-24: 500 mg via ORAL
  Filled 2022-05-24: qty 2

## 2022-05-24 MED ORDER — SODIUM CHLORIDE 0.9 % IV BOLUS
1000.0000 mL | Freq: Once | INTRAVENOUS | Status: AC
  Administered 2022-05-24: 1000 mL via INTRAVENOUS

## 2022-05-24 NOTE — ED Provider Notes (Signed)
[facility] EMERGENCY DEPT Provider Note   CSN: [id number] Arrival date & time: 05/24/22  0800     History  Chief Complaint  Patient presents with   Cough   Vomiting    Shelley Morrison is a 56 y.o. female.  HPI     56 year old female with history of lupus, rheumatoid arthritis, hypertension comes in with chief complaint of cough, vomiting and congestion.  Patient started getting sick 2 days ago with some upper respiratory infection symptoms.  Shelley Morrison also started developing nausea and vomiting along with diarrhea.  Shelley Morrison feels dehydrated.  Patient is also having cough, that is producing some yellow-green phlegm.  Shelley Morrison has chest pain with coughing.  Review of system is also positive for fevers and body aches.  No sick exposures.  Home Medications Prior to Admission medications   Medication Sig Start Date End Date Taking? Authorizing Provider  Acetaminophen (TYLENOL PO) Take by mouth as needed.    [provider]  atenolol (TENORMIN) 50 MG tablet Take 25 mg by mouth daily. 06/15/19   [provider]  Cholecalciferol (VITAMIN D) 50 MCG (2000 UT) CAPS Take by mouth.    [provider]  cyclobenzaprine (FLEXERIL) 10 MG tablet at bedtime as needed.  06/28/19   [provider]  escitalopram (LEXAPRO) 5 MG tablet Take 5 mg by mouth daily. 02/07/22   [provider]  Etanercept (ENBREL MINI) 50 MG/ML SOCT Inject 50 mg into the skin once a week. 04/21/22   Gearldine Bienenstock, PA-C  losartan (COZAAR) 25 MG tablet Take 50 mg by mouth daily. 03/09/21   [provider]  sulfaSALAzine (AZULFIDINE) 500 MG tablet Take 2 tablets (1,000 mg total) by mouth 2 (two) times daily. 03/20/22   Gearldine Bienenstock, PA-C  traZODone (DESYREL) 150 MG tablet 50 mg as needed. 06/16/19   [provider]  TURMERIC PO Take by mouth daily. Patient not taking: Reported on 04/24/2022    [provider]      Allergies    Acetaminophen-codeine    Review  of Systems   Review of Systems  All other systems reviewed and are negative.  Physical Exam Updated Vital Signs BP 130/78 (BP Location: Right Arm)   Pulse 69   Temp 98.3 F (36.8 C) (Oral)   Resp 17   SpO2 96%  Physical Exam Vitals and nursing note reviewed.  Constitutional:      Appearance: Shelley Morrison is well-developed.  HENT:     Head: Atraumatic.  Eyes:     Extraocular Movements: Extraocular movements intact.     Pupils: Pupils are equal, round, and reactive to light.  Cardiovascular:     Rate and Rhythm: Normal rate.  Pulmonary:     Effort: Pulmonary effort is normal.  Musculoskeletal:     Cervical back: Normal range of motion and neck supple.  Skin:    General: Skin is warm and dry.  Neurological:     Mental Status: Shelley Morrison is alert and oriented to person, place, and time.    ED Results / Procedures / Treatments   Labs (all labs ordered are listed, but only abnormal results are displayed) Labs Reviewed  RESP PANEL BY RT-PCR (FLU A&B, COVID) ARPGX2 - Abnormal; Notable for the following components:      Result Value   Influenza A by PCR POSITIVE (*)    All other components within normal limits  COMPREHENSIVE METABOLIC PANEL - Abnormal; Notable for the following components:   Glucose, Bld 147 (*)  AST 73 (*)    ALT 96 (*)    All other components within normal limits  CBC - Abnormal; Notable for the following components:   WBC 3.5 (*)    All other components within normal limits  URINALYSIS, ROUTINE W REFLEX MICROSCOPIC - Abnormal; Notable for the following components:   Protein, ur 100 (*)    Bacteria, UA RARE (*)    All other components within normal limits  LIPASE, BLOOD  PREGNANCY, URINE    EKG None  Radiology DG Chest Port 1 View  Result Date: 05/24/2022 CLINICAL DATA:  Cough, body aches and fevers. EXAM: PORTABLE CHEST 1 VIEW COMPARISON:  05/27/2017 FINDINGS: The heart size and mediastinal contours are within normal limits. Both lungs are clear. The  visualized skeletal structures are unremarkable. IMPRESSION: No active disease. Electronically Signed   By: Signa Kell M.D.   On: 05/24/2022 10:38    Procedures Procedures    Medications Ordered in ED Medications  naproxen (NAPROSYN) tablet 500 mg (500 mg Oral Given 05/24/22 1020)  sodium chloride 0.9 % bolus 1,000 mL (0 mLs Intravenous Stopped 05/24/22 1132)  ondansetron (ZOFRAN-ODT) disintegrating tablet 4 mg (4 mg Oral Given 05/24/22 1022)    ED Course/ Medical Decision Making/ A&P                           Medical Decision Making Amount and/or Complexity of Data Reviewed Labs: ordered. Radiology: ordered.  Risk Prescription drug management.   This patient presents to the ED with chief complaint(s) of cough, nausea, vomiting with pertinent past medical history of rheumatoid arthritis, lupus which further complicates the presenting complaint. The complaint involves an extensive differential diagnosis and also carries with it a high risk of complications and morbidity.    The differential diagnosis includes COVID-19, influenza, bacterial pneumonia, electrolyte abnormalities, renal failure, UTI, gastroenteritis  The initial plan is to get basic blood work-up and start oral challenge.  Patient feels dehydrated, will give her IV fluid as well.  Shelley Morrison is noted to have low-grade fever.  Clinically not toxic appearing or septic.  Shelley Morrison does have immunosuppressive condition, therefore will need reassessment if our work-up is reassuring.   Additional history obtained: Records reviewed Primary Care Documents -reviewed outpatient rheumatologist visits and reviewed medications.  Independent labs interpretation:  The following labs were independently interpreted: Fogelman count is normal.  Urine analysis is not indicative of any underlying infection.  Electrolytes are reassuring.  Patient has influenza A, but negative COVID-19.  Independent visualization of imaging: - I independently visualized  the following imaging with scope of interpretation limited to determining acute life threatening conditions related to emergency care: X-ray of the chest, which revealed no evidence of focal consolidation.  Treatment and Reassessment: Patient reassessed at 12:30 PM.  Shelley Morrison indicates that Shelley Morrison is feeling a lot better.  Discussed with her the influenza a positive finding.  Discussed with her option of giving her Tamiflu.  After discussing the side effects of Tamiflu and the benefits, Shelley Morrison has declined it.  Shelley Morrison is also declined Zofran.  Shelley Morrison feels that Shelley Morrison will be able to hydrate herself well.  Strict ER return precautions have been discussed, and patient is agreeing with the plan and is comfortable with the workup done and the recommendations from the ER.  Final Clinical Impression(s) / ED Diagnoses Final diagnoses:  Influenza A    Rx / DC Orders ED Discharge Orders     None  Derwood KaplanNanavati, Macil Crady, MD 05/24/22 1248

## 2022-05-24 NOTE — Discharge Instructions (Addendum)
The work-up in the ER is overall reassuring besides showing that you have influenza. Influenza should be self-limiting, but if you start having severe nausea and vomiting, worsening shortness of breath -please return to the ER. Take Tylenol and ibuprofen for symptom management and drink plenty of fluid.

## 2022-05-24 NOTE — ED Notes (Signed)
Crackers, water, and sprite provided at the bedside.

## 2022-05-24 NOTE — ED Notes (Signed)
Discharge instructions and follow up care reviewed and explained, pt verbalized understanding and had no further questions at discharge. Pt caox4 and ambulatory on departure.  ?

## 2022-05-24 NOTE — ED Triage Notes (Signed)
She reports cough with some n/v/d since this Thurs. She tells me she has had 3 episodes of vomiting; and 4 episodes of diarrhea in the past 24 hours.

## 2022-06-12 NOTE — Progress Notes (Deleted)
Office Visit Note  Patient: Shelley Morrison             Date of Birth: 1966/04/08           MRN: [id number]             PCP: Gilda Crease, MD Referring: Gilda Crease, MD Visit Date: 06/26/2022 Occupation: @GUAROCC @  Subjective:  No chief complaint on file.   History of Present Illness: Shelley Morrison is a 56 y.o. female ***   Activities of Daily Living:  Patient reports morning stiffness for *** {minute/hour:19697}.   Patient {ACTIONS;DENIES/REPORTS:21021675::"Denies"} nocturnal pain.  Difficulty dressing/grooming: {ACTIONS;DENIES/REPORTS:21021675::"Denies"} Difficulty climbing stairs: {ACTIONS;DENIES/REPORTS:21021675::"Denies"} Difficulty getting out of chair: {ACTIONS;DENIES/REPORTS:21021675::"Denies"} Difficulty using hands for taps, buttons, cutlery, and/or writing: {ACTIONS;DENIES/REPORTS:21021675::"Denies"}  No Rheumatology ROS completed.   PMFS History:  Patient Active Problem List   Diagnosis Date Noted   Rheumatoid arthritis with rheumatoid factor of multiple sites without organ or systems involvement (HCC) 06/19/2017   High risk medication use 06/18/2017   Vitamin D deficiency 06/18/2017   Primary insomnia 05/19/2017   Essential hypertension 05/19/2017   ANA positive 05/08/2017   Rheumatoid factor positive 05/08/2017    Past Medical History:  Diagnosis Date   Hypertension    Lupus (HCC)    Rheumatoid arthritis (HCC)     Family History  Problem Relation Age of Onset   Healthy Son    Healthy Daughter    Past Surgical History:  Procedure Laterality Date   CHOLECYSTECTOMY     Social History   Social History Narrative   Not on file   Immunization History  Administered Date(s) Administered   PFIZER(Purple Top)SARS-COV-2 Vaccination 09/24/2020, 10/15/2020     Objective: Vital Signs: There were no vitals taken for this visit.   Physical Exam   Musculoskeletal Exam: ***  CDAI Exam: CDAI Score: -- Patient Global: --; Provider Global:  -- Swollen: --; Tender: -- Joint Exam 06/26/2022   No joint exam has been documented for this visit   There is currently no information documented on the homunculus. Go to the Rheumatology activity and complete the homunculus joint exam.  Investigation: No additional findings.  Imaging: DG Chest Port 1 View  Result Date: 05/24/2022 CLINICAL DATA:  Cough, body aches and fevers. EXAM: PORTABLE CHEST 1 VIEW COMPARISON:  05/27/2017 FINDINGS: The heart size and mediastinal contours are within normal limits. Both lungs are clear. The visualized skeletal structures are unremarkable. IMPRESSION: No active disease. Electronically Signed   By: 05/29/2017 M.D.   On: 05/24/2022 10:38    Recent Labs: Lab Results  Component Value Date   WBC 3.5 (L) 05/24/2022   HGB 12.9 05/24/2022   PLT 186 05/24/2022   NA 136 05/24/2022   K 3.7 05/24/2022   CL 102 05/24/2022   CO2 24 05/24/2022   GLUCOSE 147 (H) 05/24/2022   BUN 9 05/24/2022   CREATININE 0.91 05/24/2022   BILITOT 0.4 05/24/2022   ALKPHOS 65 05/24/2022   AST 73 (H) 05/24/2022   ALT 96 (H) 05/24/2022   PROT 7.5 05/24/2022   ALBUMIN 4.1 05/24/2022   CALCIUM 9.0 05/24/2022   GFRAA 82 07/03/2021   QFTBGOLDPLUS NEGATIVE 08/16/2021    Speciality Comments: PLQ Eye Exam:11/25/2021 WNL @ [facility] Follow up in 6 months, methotrexate-fatigue nausea and brain fog Arava-hair loss, inadequate response  Procedures:  No procedures performed Allergies: Acetaminophen-codeine   Assessment / Plan:     Visit Diagnoses: Seropositive rheumatoid arthritis of multiple sites (HCC)  High risk medication use  Elevated LFTs  Primary osteoarthritis of both hands  Primary osteoarthritis of left hip  Primary osteoarthritis of both feet  History of vitamin D deficiency  Primary insomnia  History of hypertension  Orders: No orders of the defined types were placed in this encounter.  No orders of the defined types were placed in  this encounter.   Face-to-face time spent with patient was *** minutes. Greater than 50% of time was spent in counseling and coordination of care.  Follow-Up Instructions: No follow-ups on file.   Gearldine Bienenstock, PA-C  Note - This record has been created using Dragon software.  Chart creation errors have been sought, but may not always  have been located. Such creation errors do not reflect on  the standard of medical care.

## 2022-06-26 ENCOUNTER — Ambulatory Visit: Payer: BC Managed Care – PPO | Admitting: Physician Assistant

## 2022-06-26 DIAGNOSIS — M19071 Primary osteoarthritis, right ankle and foot: Secondary | ICD-10-CM

## 2022-06-26 DIAGNOSIS — M1612 Unilateral primary osteoarthritis, left hip: Secondary | ICD-10-CM

## 2022-06-26 DIAGNOSIS — Z8679 Personal history of other diseases of the circulatory system: Secondary | ICD-10-CM

## 2022-06-26 DIAGNOSIS — M0579 Rheumatoid arthritis with rheumatoid factor of multiple sites without organ or systems involvement: Secondary | ICD-10-CM

## 2022-06-26 DIAGNOSIS — R7989 Other specified abnormal findings of blood chemistry: Secondary | ICD-10-CM

## 2022-06-26 DIAGNOSIS — F5101 Primary insomnia: Secondary | ICD-10-CM

## 2022-06-26 DIAGNOSIS — Z79899 Other long term (current) drug therapy: Secondary | ICD-10-CM

## 2022-06-26 DIAGNOSIS — Z8639 Personal history of other endocrine, nutritional and metabolic disease: Secondary | ICD-10-CM

## 2022-06-26 DIAGNOSIS — M19041 Primary osteoarthritis, right hand: Secondary | ICD-10-CM

## 2022-06-26 NOTE — Progress Notes (Deleted)
Office Visit Note  Patient: Shelley Morrison             Date of Birth: October 01, 1966           MRN: [id number]             PCP: Gilda Crease, MD Referring: Gilda Crease, MD Visit Date: 07/07/2022 Occupation: @GUAROCC @  Subjective:    History of Present Illness: Shelley Morrison is a 56 y.o. female with history of seropositive rheumatoid arthritis and osteoarthritis.  She is taking sulfasalazine 500 mg 2 tablets twice daily and Enbrel 50 mg subcutaneous injections once weekly.   CBC and CMP were drawn on 05/24/2022.  CBC and CMP will be repeated in August and every 3 months to monitor for drug toxicity.  Standing orders for CBC and CMP remain in place.  TB Gold negative on 08/16/2021.  Future order for TB Gold placed today. Discussed the importance of holding Enbrel and sulfasalazine if she develops signs or symptoms of infection and to resume once the infection has completely cleared.  Activities of Daily Living:  Patient reports morning stiffness for *** {minute/hour:19697}.   Patient {ACTIONS;DENIES/REPORTS:21021675::"Denies"} nocturnal pain.  Difficulty dressing/grooming: {ACTIONS;DENIES/REPORTS:21021675::"Denies"} Difficulty climbing stairs: {ACTIONS;DENIES/REPORTS:21021675::"Denies"} Difficulty getting out of chair: {ACTIONS;DENIES/REPORTS:21021675::"Denies"} Difficulty using hands for taps, buttons, cutlery, and/or writing: {ACTIONS;DENIES/REPORTS:21021675::"Denies"}  No Rheumatology ROS completed.   PMFS History:  Patient Active Problem List   Diagnosis Date Noted   Rheumatoid arthritis with rheumatoid factor of multiple sites without organ or systems involvement (HCC) 06/19/2017   High risk medication use 06/18/2017   Vitamin D deficiency 06/18/2017   Primary insomnia 05/19/2017   Essential hypertension 05/19/2017   ANA positive 05/08/2017   Rheumatoid factor positive 05/08/2017    Past Medical History:  Diagnosis Date   Hypertension    Lupus (HCC)    Rheumatoid  arthritis (HCC)     Family History  Problem Relation Age of Onset   Healthy Son    Healthy Daughter    Past Surgical History:  Procedure Laterality Date   CHOLECYSTECTOMY     Social History   Social History Narrative   Not on file   Immunization History  Administered Date(s) Administered   PFIZER(Purple Top)SARS-COV-2 Vaccination 09/24/2020, 10/15/2020     Objective: Vital Signs: There were no vitals taken for this visit.   Physical Exam Vitals and nursing note reviewed.  Constitutional:      Appearance: She is well-developed.  HENT:     Head: Normocephalic and atraumatic.  Eyes:     Conjunctiva/sclera: Conjunctivae normal.  Cardiovascular:     Rate and Rhythm: Normal rate and regular rhythm.     Heart sounds: Normal heart sounds.  Pulmonary:     Effort: Pulmonary effort is normal.     Breath sounds: Normal breath sounds.  Abdominal:     General: Bowel sounds are normal.     Palpations: Abdomen is soft.  Musculoskeletal:     Cervical back: Normal range of motion.  Skin:    General: Skin is warm and dry.     Capillary Refill: Capillary refill takes less than 2 seconds.  Neurological:     Mental Status: She is alert and oriented to person, place, and time.  Psychiatric:        Behavior: Behavior normal.      Musculoskeletal Exam: ***  CDAI Exam: CDAI Score: -- Patient Global: --; Provider Global: -- Swollen: --; Tender: -- Joint Exam 07/07/2022   No joint exam  has been documented for this visit   There is currently no information documented on the homunculus. Go to the Rheumatology activity and complete the homunculus joint exam.  Investigation: No additional findings.  Imaging: No results found.  Recent Labs: Lab Results  Component Value Date   WBC 3.5 (L) 05/24/2022   HGB 12.9 05/24/2022   PLT 186 05/24/2022   NA 136 05/24/2022   K 3.7 05/24/2022   CL 102 05/24/2022   CO2 24 05/24/2022   GLUCOSE 147 (H) 05/24/2022   BUN 9 05/24/2022    CREATININE 0.91 05/24/2022   BILITOT 0.4 05/24/2022   ALKPHOS 65 05/24/2022   AST 73 (H) 05/24/2022   ALT 96 (H) 05/24/2022   PROT 7.5 05/24/2022   ALBUMIN 4.1 05/24/2022   CALCIUM 9.0 05/24/2022   GFRAA 82 07/03/2021   QFTBGOLDPLUS NEGATIVE 08/16/2021    Speciality Comments: PLQ Eye Exam:11/25/2021 WNL @ [facility] Follow up in 6 months, methotrexate-fatigue nausea and brain fog Arava-hair loss, inadequate response  Procedures:  No procedures performed Allergies: Acetaminophen-codeine   Assessment / Plan:     Visit Diagnoses: Seropositive rheumatoid arthritis of multiple sites (HCC)  High risk medication use  Elevated LFTs  Primary osteoarthritis of both hands  Primary osteoarthritis of left hip  Primary osteoarthritis of both feet  History of vitamin D deficiency  Primary insomnia  History of hypertension  Orders: No orders of the defined types were placed in this encounter.  No orders of the defined types were placed in this encounter.   Face-to-face time spent with patient was *** minutes. Greater than 50% of time was spent in counseling and coordination of care.  Follow-Up Instructions: No follow-ups on file.   Gearldine Bienenstock, PA-C  Note - This record has been created using Dragon software.  Chart creation errors have been sought, but may not always  have been located. Such creation errors do not reflect on  the standard of medical care.

## 2022-07-05 ENCOUNTER — Other Ambulatory Visit: Payer: Self-pay | Admitting: Physician Assistant

## 2022-07-07 ENCOUNTER — Ambulatory Visit: Payer: BC Managed Care – PPO | Admitting: Physician Assistant

## 2022-07-07 DIAGNOSIS — M0579 Rheumatoid arthritis with rheumatoid factor of multiple sites without organ or systems involvement: Secondary | ICD-10-CM

## 2022-07-07 DIAGNOSIS — Z8679 Personal history of other diseases of the circulatory system: Secondary | ICD-10-CM

## 2022-07-07 DIAGNOSIS — M1612 Unilateral primary osteoarthritis, left hip: Secondary | ICD-10-CM

## 2022-07-07 DIAGNOSIS — Z8639 Personal history of other endocrine, nutritional and metabolic disease: Secondary | ICD-10-CM

## 2022-07-07 DIAGNOSIS — Z79899 Other long term (current) drug therapy: Secondary | ICD-10-CM

## 2022-07-07 DIAGNOSIS — R7989 Other specified abnormal findings of blood chemistry: Secondary | ICD-10-CM

## 2022-07-07 DIAGNOSIS — F5101 Primary insomnia: Secondary | ICD-10-CM

## 2022-07-07 DIAGNOSIS — M19071 Primary osteoarthritis, right ankle and foot: Secondary | ICD-10-CM

## 2022-07-07 DIAGNOSIS — M19041 Primary osteoarthritis, right hand: Secondary | ICD-10-CM

## 2022-07-07 DIAGNOSIS — Z111 Encounter for screening for respiratory tuberculosis: Secondary | ICD-10-CM

## 2022-07-07 NOTE — Telephone Encounter (Signed)
Please schedule patient a follow up visit. Patient due July 2023. Thanks! Patient has cancelled appointment on 05/02/2022 and 07/07/2022. Please make patient aware if she cancels her next appointment she may not be reschedule.

## 2022-07-07 NOTE — Telephone Encounter (Signed)
Next Visit: Due July 2023. Message sent to the front to schedule. (Patient has cancelled appointment on 05/02/2022 and 07/07/2022. Please make patient aware if she cancels her next appointment she may not be reschedule.)  Last Visit: 04/24/2022  Last Fill: 03/20/2022  DX: Seropositive rheumatoid arthritis of multiple sites   Current Dose per office note 04/24/2022: sulfasalazine 500 mg 2 tablets twice daily  Labs: 05/24/2022 WBC 3.5, Glucose 147, AST 73, ALT 96  Okay to refill SSZ?

## 2022-07-07 NOTE — Telephone Encounter (Signed)
Patient will call back to reschedule.

## 2022-08-14 ENCOUNTER — Telehealth: Payer: Self-pay | Admitting: Pharmacist

## 2022-08-14 NOTE — Telephone Encounter (Signed)
Submitted a Prior Authorization RENEWAL request to [facility] for ENBREL via CoverMyMeds. Will update once we receive a response.  Key: [id number]  Chesley Mires, PharmD, MPH, BCPS, CPP Clinical Pharmacist (Rheumatology and Pulmonology)

## 2022-08-15 NOTE — Telephone Encounter (Signed)
Received notification from [facility] regarding a prior authorization for ENBREL. Authorization has been APPROVED from 08/14/2022 through 08/13/2023.  Patient can continue to fill through [facility]: [phone]  Chesley Mires, PharmD, MPH, BCPS, CPP Clinical Pharmacist (Rheumatology and Pulmonology)

## 2022-08-22 ENCOUNTER — Emergency Department (HOSPITAL_BASED_OUTPATIENT_CLINIC_OR_DEPARTMENT_OTHER)
Admission: EM | Admit: 2022-08-22 | Discharge: 2022-08-22 | Discharge status: Against Medical Advice | Payer: BC Managed Care – PPO | Attending: Emergency Medicine | Admitting: Emergency Medicine

## 2022-08-22 ENCOUNTER — Encounter (HOSPITAL_BASED_OUTPATIENT_CLINIC_OR_DEPARTMENT_OTHER): Payer: Self-pay

## 2022-08-22 ENCOUNTER — Other Ambulatory Visit: Payer: Self-pay

## 2022-08-22 DIAGNOSIS — M79605 Pain in left leg: Secondary | ICD-10-CM | POA: Diagnosis not present

## 2022-08-22 DIAGNOSIS — Z5321 Procedure and treatment not carried out due to patient leaving prior to being seen by health care provider: Secondary | ICD-10-CM | POA: Diagnosis not present

## 2022-08-22 NOTE — ED Triage Notes (Signed)
Patient here POV from Home.  Endorses Left Upper Leg Pain that began Today at approximately 1130.   Recent Diagnosis of Lupus a Few years Ago. No New Injury or Trauma.   NAD Noted during Triage. A&Ox4. GCS 15. Ambulatory.

## 2022-10-10 NOTE — Progress Notes (Deleted)
Office Visit Note  Patient: Shelley Morrison             Date of Birth: 11-07-1966           MRN: [id number]             PCP: Javier Docker, MD Referring: Javier Docker, MD Visit Date: 10/17/2022 Occupation: @GUAROCC @  Subjective:  No chief complaint on file.   History of Present Illness: Shelley Morrison is a 56 y.o. female ***   Activities of Daily Living:  Patient reports morning stiffness for *** {minute/hour:19697}.   Patient {ACTIONS;DENIES/REPORTS:21021675::"Denies"} nocturnal pain.  Difficulty dressing/grooming: {ACTIONS;DENIES/REPORTS:21021675::"Denies"} Difficulty climbing stairs: {ACTIONS;DENIES/REPORTS:21021675::"Denies"} Difficulty getting out of chair: {ACTIONS;DENIES/REPORTS:21021675::"Denies"} Difficulty using hands for taps, buttons, cutlery, and/or writing: {ACTIONS;DENIES/REPORTS:21021675::"Denies"}  No Rheumatology ROS completed.   PMFS History:  Patient Active Problem List   Diagnosis Date Noted   Rheumatoid arthritis with rheumatoid factor of multiple sites without organ or systems involvement ([place]) 06/19/2017   High risk medication use 06/18/2017   Vitamin D deficiency 06/18/2017   Primary insomnia 05/19/2017   Essential hypertension 05/19/2017   ANA positive 05/08/2017   Rheumatoid factor positive 05/08/2017    Past Medical History:  Diagnosis Date   Hypertension    Lupus ([place])    Rheumatoid arthritis ([place])     Family History  Problem Relation Age of Onset   Healthy Son    Healthy Daughter    Past Surgical History:  Procedure Laterality Date   CHOLECYSTECTOMY     Social History   Social History Narrative   Not on file   Immunization History  Administered Date(s) Administered   PFIZER(Purple Top)SARS-COV-2 Vaccination 09/24/2020, 10/15/2020     Objective: Vital Signs: There were no vitals taken for this visit.   Physical Exam   Musculoskeletal Exam: ***  CDAI Exam: CDAI Score: -- Patient Global: --; Provider Global:  -- Swollen: --; Tender: -- Joint Exam 10/17/2022   No joint exam has been documented for this visit   There is currently no information documented on the homunculus. Go to the Rheumatology activity and complete the homunculus joint exam.  Investigation: No additional findings.  Imaging: No results found.  Recent Labs: Lab Results  Component Value Date   WBC 3.5 (L) 05/24/2022   HGB 12.9 05/24/2022   PLT 186 05/24/2022   NA 136 05/24/2022   K 3.7 05/24/2022   CL 102 05/24/2022   CO2 24 05/24/2022   GLUCOSE 147 (H) 05/24/2022   BUN 9 05/24/2022   CREATININE 0.91 05/24/2022   BILITOT 0.4 05/24/2022   ALKPHOS 65 05/24/2022   AST 73 (H) 05/24/2022   ALT 96 (H) 05/24/2022   PROT 7.5 05/24/2022   ALBUMIN 4.1 05/24/2022   CALCIUM 9.0 05/24/2022   GFRAA 82 07/03/2021   QFTBGOLDPLUS NEGATIVE 08/16/2021    Speciality Comments: PLQ Eye Exam:11/25/2021 WNL @ [facility] Follow up in 6 months, methotrexate-fatigue nausea and brain fog Arava-hair loss, inadequate response  Procedures:  No procedures performed Allergies: Acetaminophen-codeine   Assessment / Plan:     Visit Diagnoses: Seropositive rheumatoid arthritis of multiple sites ([place])  High risk medication use  Elevated LFTs  Primary osteoarthritis of both hands  Primary osteoarthritis of left hip  Primary osteoarthritis of both feet  History of vitamin D deficiency  Primary insomnia  History of hypertension  Orders: No orders of the defined types were placed in this encounter.  No orders of the defined types were placed in this  encounter.   Face-to-face time spent with patient was *** minutes. Greater than 50% of time was spent in counseling and coordination of care.  Follow-Up Instructions: No follow-ups on file.   Gearldine Bienenstock, PA-C  Note - This record has been created using Dragon software.  Chart creation errors have been sought, but may not always  have been located. Such creation  errors do not reflect on  the standard of medical care.

## 2022-10-17 ENCOUNTER — Ambulatory Visit: Payer: BC Managed Care – PPO | Admitting: Physician Assistant

## 2022-10-17 DIAGNOSIS — M19071 Primary osteoarthritis, right ankle and foot: Secondary | ICD-10-CM

## 2022-10-17 DIAGNOSIS — M1612 Unilateral primary osteoarthritis, left hip: Secondary | ICD-10-CM

## 2022-10-17 DIAGNOSIS — M19042 Primary osteoarthritis, left hand: Secondary | ICD-10-CM

## 2022-10-17 DIAGNOSIS — Z8639 Personal history of other endocrine, nutritional and metabolic disease: Secondary | ICD-10-CM

## 2022-10-17 DIAGNOSIS — Z79899 Other long term (current) drug therapy: Secondary | ICD-10-CM

## 2022-10-17 DIAGNOSIS — M0579 Rheumatoid arthritis with rheumatoid factor of multiple sites without organ or systems involvement: Secondary | ICD-10-CM

## 2022-10-17 DIAGNOSIS — Z8679 Personal history of other diseases of the circulatory system: Secondary | ICD-10-CM

## 2022-10-17 DIAGNOSIS — R7989 Other specified abnormal findings of blood chemistry: Secondary | ICD-10-CM

## 2022-10-17 DIAGNOSIS — F5101 Primary insomnia: Secondary | ICD-10-CM

## 2022-10-20 NOTE — Progress Notes (Signed)
Office Visit Note  Patient: Shelley Morrison             Date of Birth: 1966-02-04           MRN: [id number]             PCP: Gilda Crease, MD Referring: Gilda Crease, MD Visit Date: 10/24/2022 Occupation: @GUAROCC @  Subjective:  Medication monitoring   History of Present Illness: Shelley Morrison is a 56 y.o. female with history of seropositive rheumatoid arthritis and osteoarthritis.  She remains on sulfasalazine 500 mg 2 tablets twice daily.  She is tolerating sulfasalazine without any side effects and has not missed any doses recently.  Patient reports that she discontinued Enbrel in July 2023 due to getting the flu and 2 weeks later being diagnosed with COVID-19.  She was concerned by the level of immunosuppression and has discontinued it does not plan on restarting at this time.  She denies any signs or symptoms of a flare since discontinuing Enbrel.  She is not experiencing any joint pain or joint swelling.  Her morning stiffness has been lasting about 30 minutes daily and typically resolves after having a shower.  She has been exercising on a daily basis.  She denies any nocturnal pain.  She denies any new medical conditions.   Activities of Daily Living:  Patient reports morning stiffness for 30 minutes.   Patient Denies nocturnal pain.  Difficulty dressing/grooming: Denies Difficulty climbing stairs: Denies Difficulty getting out of chair: Denies Difficulty using hands for taps, buttons, cutlery, and/or writing: Denies  Review of Systems  Constitutional:  Negative for fatigue.  HENT:  Negative for mouth sores and mouth dryness.   Eyes:  Positive for dryness.  Respiratory:  Negative for shortness of breath.   Cardiovascular:  Negative for chest pain and palpitations.  Gastrointestinal:  Negative for blood in stool, constipation and diarrhea.  Endocrine: Negative for increased urination.  Genitourinary:  Negative for involuntary urination.  Musculoskeletal:  Positive  for morning stiffness. Negative for joint pain, gait problem, joint pain, joint swelling, myalgias, muscle weakness, muscle tenderness and myalgias.  Skin:  Negative for color change, rash, hair loss and sensitivity to sunlight.  Allergic/Immunologic: Negative for susceptible to infections.  Neurological:  Negative for dizziness and headaches.  Hematological:  Negative for swollen glands.  Psychiatric/Behavioral:  Negative for depressed mood and sleep disturbance. The patient is not nervous/anxious.     PMFS History:  Patient Active Problem List   Diagnosis Date Noted   Rheumatoid arthritis with rheumatoid factor of multiple sites without organ or systems involvement (HCC) 06/19/2017   High risk medication use 06/18/2017   Vitamin D deficiency 06/18/2017   Primary insomnia 05/19/2017   Essential hypertension 05/19/2017   ANA positive 05/08/2017   Rheumatoid factor positive 05/08/2017    Past Medical History:  Diagnosis Date   Hypertension    Lupus (HCC)    Rheumatoid arthritis (HCC)     Family History  Problem Relation Age of Onset   Healthy Son    Healthy Daughter    Past Surgical History:  Procedure Laterality Date   CHOLECYSTECTOMY     Social History   Social History Narrative   Not on file   Immunization History  Administered Date(s) Administered   PFIZER(Purple Top)SARS-COV-2 Vaccination 09/24/2020, 10/15/2020     Objective: Vital Signs: BP (!) 151/90 (BP Location: Left Arm, Patient Position: Sitting, Cuff Size: Normal)   Pulse 76   Resp 15  Ht 5\' 10"  (1.778 m)   Wt 215 lb (97.5 kg)   BMI 30.85 kg/m    Physical Exam Vitals and nursing note reviewed.  Constitutional:      Appearance: She is well-developed.  HENT:     Head: Normocephalic and atraumatic.  Eyes:     Conjunctiva/sclera: Conjunctivae normal.  Cardiovascular:     Rate and Rhythm: Normal rate and regular rhythm.     Heart sounds: Normal heart sounds.  Pulmonary:     Effort: Pulmonary  effort is normal.     Breath sounds: Normal breath sounds.  Abdominal:     General: Bowel sounds are normal.     Palpations: Abdomen is soft.  Musculoskeletal:     Cervical back: Normal range of motion.  Skin:    General: Skin is warm and dry.     Capillary Refill: Capillary refill takes less than 2 seconds.  Neurological:     Mental Status: She is alert and oriented to person, place, and time.  Psychiatric:        Behavior: Behavior normal.      Musculoskeletal Exam: C-spine, thoracic spine, lumbar spine have good range of motion.  No midline spinal tenderness or SI joint tenderness.  Shoulder joints, elbow joints, wrist joints, MCPs, PIPs, DIPs have good range of motion with no synovitis.  Complete fist formation bilaterally.  Hip joints have good range of motion with no groin pain.  No tenderness over trochanteric bursa bilaterally.  Knee joints have good range of motion with no warmth or effusion.  Ankle joints have good range of motion with no tenderness or joint swelling.  CDAI Exam: CDAI Score: -- Patient Global: 2 mm; Provider Global: 2 mm Swollen: --; Tender: -- Joint Exam 10/24/2022   No joint exam has been documented for this visit   There is currently no information documented on the homunculus. Go to the Rheumatology activity and complete the homunculus joint exam.  Investigation: No additional findings.  Imaging: No results found.  Recent Labs: Lab Results  Component Value Date   WBC 3.5 (L) 05/24/2022   HGB 12.9 05/24/2022   PLT 186 05/24/2022   NA 136 05/24/2022   K 3.7 05/24/2022   CL 102 05/24/2022   CO2 24 05/24/2022   GLUCOSE 147 (H) 05/24/2022   BUN 9 05/24/2022   CREATININE 0.91 05/24/2022   BILITOT 0.4 05/24/2022   ALKPHOS 65 05/24/2022   AST 73 (H) 05/24/2022   ALT 96 (H) 05/24/2022   PROT 7.5 05/24/2022   ALBUMIN 4.1 05/24/2022   CALCIUM 9.0 05/24/2022   GFRAA 82 07/03/2021   QFTBGOLDPLUS NEGATIVE 08/16/2021    Speciality Comments:  PLQ Eye Exam:11/25/2021 WNL @ [facility] Follow up in 6 months, methotrexate-fatigue nausea and brain fog Arava-hair loss, inadequate response  Procedures:  No procedures performed Allergies: Acetaminophen-codeine   Assessment / Plan:     Visit Diagnoses: Seropositive rheumatoid arthritis of multiple sites [facility]): She has no joint tenderness or synovitis on examination today.  She has not had any signs or symptoms of a rheumatoid arthritis flare.  She has clinically been doing well taking sulfasalazine 500 mg 2 tablets by mouth twice daily.  She is tolerating sulfasalazine without any side effects and has not missed any doses recently.  She discontinued Enbrel in July 2023 due to recurrent infections.  She does not plan on resuming Enbrel at this time since her symptoms have been manageable on sulfasalazine as monotherapy.  She has been  exercising on a daily basis without difficulty.  She has not had any nocturnal pain.  No difficulty with ADLs.  She will remain on sulfasalazine as prescribed.  She will follow-up in the office in 3 months for lab work and an office visit.  High risk medication use - Sulfasalazine 500 mg 2 tablets by mouth twice daily. Enbrel initiated on 08/29/21-discontinued in July 2023-recurrent infections.  CBC and CMP updated on 05/24/22.  Orders for CBC and CMP were released today.  Her next lab work will be due in January every 3 months to monitor for drug toxicity. TB gold negative on 08/16/21.  Discussed the importance of holding SSZ if she develops signs or symptoms of an infection and to resume once infection is completely cleared. Patient received the annual flu shot in October 2023. She denies any new medical conditions.   - Plan: CBC with Differential/Platelet, COMPLETE METABOLIC PANEL WITH GFR  Elevated LFTs -AST was 73 and ALT was 96 on 05/24/2022.  CMP was ordered today.  Plan: COMPLETE METABOLIC PANEL WITH GFR  Primary osteoarthritis of both hands: She has  PIP and DIP thickening consistent with osteoarthritis of both hands.  No tenderness or inflammation noted.  Complete fist formation bilaterally.  Primary osteoarthritis of left hip: Good range of motion of the left hip joint on examination today.  No groin pain.  No tenderness over the left trochanteric bursa.  Primary osteoarthritis of both feet: Patient is planning on proceeding with a left bunionectomy in the future.  Advised to hold sulfasalazine at least 1 week prior to surgery and to be cleared by the orthopedist/podiatrist after surgery prior to resuming therapy.  History of vitamin D deficiency: Patient is taking vitamin D 2000 units daily.  Primary insomnia: She takes trazodone at bedtime as needed.  History of hypertension: Blood pressure was elevated today in the office: 151/90.  Orders: Orders Placed This Encounter  Procedures   CBC with Differential/Platelet   COMPLETE METABOLIC PANEL WITH GFR   Meds ordered this encounter  Medications   sulfaSALAzine (AZULFIDINE) 500 MG tablet    Sig: Take 2 tablets (1,000 mg total) by mouth 2 (two) times daily.    Dispense:  360 tablet    Refill:  0     Follow-Up Instructions: Return in about 3 months (around 01/24/2023) for Rheumatoid arthritis, Osteoarthritis.   Ofilia Neas, PA-C  Note - This record has been created using Dragon software.  Chart creation errors have been sought, but may not always  have been located. Such creation errors do not reflect on  the standard of medical care.

## 2022-10-24 ENCOUNTER — Encounter: Payer: Self-pay | Admitting: Physician Assistant

## 2022-10-24 ENCOUNTER — Ambulatory Visit: Payer: BC Managed Care – PPO | Attending: Physician Assistant | Admitting: Physician Assistant

## 2022-10-24 VITALS — BP 151/90 | HR 76 | Resp 15 | Ht 70.0 in | Wt 215.0 lb

## 2022-10-24 DIAGNOSIS — R7989 Other specified abnormal findings of blood chemistry: Secondary | ICD-10-CM | POA: Diagnosis not present

## 2022-10-24 DIAGNOSIS — Z8679 Personal history of other diseases of the circulatory system: Secondary | ICD-10-CM

## 2022-10-24 DIAGNOSIS — M19041 Primary osteoarthritis, right hand: Secondary | ICD-10-CM | POA: Diagnosis not present

## 2022-10-24 DIAGNOSIS — M19042 Primary osteoarthritis, left hand: Secondary | ICD-10-CM

## 2022-10-24 DIAGNOSIS — Z79899 Other long term (current) drug therapy: Secondary | ICD-10-CM | POA: Diagnosis not present

## 2022-10-24 DIAGNOSIS — M19071 Primary osteoarthritis, right ankle and foot: Secondary | ICD-10-CM

## 2022-10-24 DIAGNOSIS — M1612 Unilateral primary osteoarthritis, left hip: Secondary | ICD-10-CM

## 2022-10-24 DIAGNOSIS — M19072 Primary osteoarthritis, left ankle and foot: Secondary | ICD-10-CM

## 2022-10-24 DIAGNOSIS — M0579 Rheumatoid arthritis with rheumatoid factor of multiple sites without organ or systems involvement: Secondary | ICD-10-CM

## 2022-10-24 DIAGNOSIS — Z8639 Personal history of other endocrine, nutritional and metabolic disease: Secondary | ICD-10-CM

## 2022-10-24 DIAGNOSIS — F5101 Primary insomnia: Secondary | ICD-10-CM

## 2022-10-24 MED ORDER — SULFASALAZINE 500 MG PO TABS: 1000.0000 mg | ORAL_TABLET | Freq: Two times a day (BID) | ORAL | 0 refills | Status: DC

## 2022-10-24 NOTE — Patient Instructions (Signed)
Standing Labs We placed an order today for your standing lab work.   Please have your standing labs drawn in January and every 3 months   Please have your labs drawn 2 weeks prior to your appointment so that the provider can discuss your lab results at your appointment.  Please note that you may see your imaging and lab results in MyChart before we have reviewed them. We will contact you once all results are reviewed. Please allow our office up to 72 hours to thoroughly review all of the results before contacting the office for clarification of your results.  Lab hours are:   Monday through Thursday from 8:00 am -12:30 pm and 1:00 pm-5:00 pm and Friday from 8:00 am-12:00 pm.  Please be advised, all patients with office appointments requiring lab work will take precedent over walk-in lab work.   Labs are drawn by [facility]. Please bring your co-pay at the time of your lab draw.  You may receive a bill from [facility] for your lab work.  Please note if you are on Hydroxychloroquine and and an order has been placed for a Hydroxychloroquine level, you will need to have it drawn 4 hours or more after your last dose.  If you wish to have your labs drawn at another location, please call the office 24 hours in advance so we can fax the orders.  The office is located at [address], [address], [place], [place] [address] No appointment is necessary.    If you have any questions regarding directions or hours of operation,  please call [phone].   As a reminder, please drink plenty of water prior to coming for your lab work. Thanks!  

## 2022-10-25 LAB — CBC WITH DIFFERENTIAL/PLATELET
Absolute Monocytes: 735 cells/uL (ref 200–950)
Basophils Absolute: 40 cells/uL (ref 0–200)
Basophils Relative: 0.7 %
Eosinophils Absolute: 148 cells/uL (ref 15–500)
Eosinophils Relative: 2.6 %
HCT: 40.8 % (ref 35.0–45.0)
Hemoglobin: 13.4 g/dL (ref 11.7–15.5)
Lymphs Abs: 2189 cells/uL (ref 850–3900)
MCH: 29.2 pg (ref 27.0–33.0)
MCHC: 32.8 g/dL (ref 32.0–36.0)
MCV: 88.9 fL (ref 80.0–100.0)
MPV: 10.8 fL (ref 7.5–12.5)
Monocytes Relative: 12.9 %
Neutro Abs: 2588 cells/uL (ref 1500–7800)
Neutrophils Relative %: 45.4 %
Platelets: 247 10*3/uL (ref 140–400)
RBC: 4.59 10*6/uL (ref 3.80–5.10)
RDW: 12.3 % (ref 11.0–15.0)
Total Lymphocyte: 38.4 %
WBC: 5.7 10*3/uL (ref 3.8–10.8)

## 2022-10-25 LAB — COMPLETE METABOLIC PANEL WITH GFR
AG Ratio: 1.1 (calc) (ref 1.0–2.5)
ALT: 83 U/L — ABNORMAL HIGH (ref 6–29)
AST: 57 U/L — ABNORMAL HIGH (ref 10–35)
Albumin: 4 g/dL (ref 3.6–5.1)
Alkaline phosphatase (APISO): 77 U/L (ref 37–153)
BUN: 9 mg/dL (ref 7–25)
CO2: 28 mmol/L (ref 20–32)
Calcium: 9.8 mg/dL (ref 8.6–10.4)
Chloride: 105 mmol/L (ref 98–110)
Creat: 0.85 mg/dL (ref 0.50–1.03)
Globulin: 3.6 g/dL (calc) (ref 1.9–3.7)
Glucose, Bld: 117 mg/dL — ABNORMAL HIGH (ref 65–99)
Potassium: 4.5 mmol/L (ref 3.5–5.3)
Sodium: 139 mmol/L (ref 135–146)
Total Bilirubin: 0.4 mg/dL (ref 0.2–1.2)
Total Protein: 7.6 g/dL (ref 6.1–8.1)
eGFR: 81 mL/min/{1.73_m2} (ref >=60)

## 2022-10-27 ENCOUNTER — Telehealth: Payer: Self-pay | Admitting: *Deleted

## 2022-10-27 ENCOUNTER — Telehealth: Payer: Self-pay | Admitting: Rheumatology

## 2022-10-27 DIAGNOSIS — R7989 Other specified abnormal findings of blood chemistry: Secondary | ICD-10-CM

## 2022-10-27 NOTE — Telephone Encounter (Signed)
[facility] left a voicemail stating they received a referral for the patient and it was declined. They state the patient has seen [facility] in the past and she should see them.

## 2022-10-27 NOTE — Progress Notes (Signed)
CBC WNL. Glucose is 117. LFTs remain elevated-AST 57 and ALT is 83.  Please notify the patient and forward to PCP or if she has a gastroenterologist.   Avoid tylenol and alcohol use.

## 2022-10-27 NOTE — Telephone Encounter (Signed)
-----   Message from Ofilia Neas, PA-C sent at 10/27/2022 11:40 AM EDT ----- Ok to place new referral to GI.

## 2022-10-27 NOTE — Telephone Encounter (Signed)
Referral changed to [facility] and faxed, pending appt.

## 2022-10-27 NOTE — Progress Notes (Signed)
Ok to place new referral to GI.

## 2022-11-03 DIAGNOSIS — Z1231 Encounter for screening mammogram for malignant neoplasm of breast: Secondary | ICD-10-CM | POA: Diagnosis not present

## 2022-11-05 ENCOUNTER — Telehealth: Payer: Self-pay | Admitting: *Deleted

## 2022-11-05 NOTE — Telephone Encounter (Signed)
Patient advised FMLA paperwork has been completed and faxed. Confirmation received that it went through. Patient advised her copy is at the front desk for pick up.

## 2022-12-16 ENCOUNTER — Other Ambulatory Visit: Payer: Self-pay | Admitting: Physician Assistant

## 2022-12-16 DIAGNOSIS — Z800 Family history of malignant neoplasm of digestive organs: Secondary | ICD-10-CM | POA: Diagnosis not present

## 2022-12-16 DIAGNOSIS — R7989 Other specified abnormal findings of blood chemistry: Secondary | ICD-10-CM

## 2022-12-16 DIAGNOSIS — E559 Vitamin D deficiency, unspecified: Secondary | ICD-10-CM | POA: Diagnosis not present

## 2022-12-21 HISTORY — PX: COLONOSCOPY: SHX174

## 2022-12-24 DIAGNOSIS — D124 Benign neoplasm of descending colon: Secondary | ICD-10-CM | POA: Diagnosis not present

## 2022-12-24 DIAGNOSIS — Z1211 Encounter for screening for malignant neoplasm of colon: Secondary | ICD-10-CM | POA: Diagnosis not present

## 2022-12-24 DIAGNOSIS — D128 Benign neoplasm of rectum: Secondary | ICD-10-CM | POA: Diagnosis not present

## 2022-12-24 DIAGNOSIS — K648 Other hemorrhoids: Secondary | ICD-10-CM | POA: Diagnosis not present

## 2022-12-24 DIAGNOSIS — Z800 Family history of malignant neoplasm of digestive organs: Secondary | ICD-10-CM | POA: Diagnosis not present

## 2022-12-24 DIAGNOSIS — K635 Polyp of colon: Secondary | ICD-10-CM | POA: Diagnosis not present

## 2022-12-24 DIAGNOSIS — K5730 Diverticulosis of large intestine without perforation or abscess without bleeding: Secondary | ICD-10-CM | POA: Diagnosis not present

## 2022-12-31 ENCOUNTER — Ambulatory Visit
Admission: RE | Admit: 2022-12-31 | Discharge: 2022-12-31 | Disposition: A | Discharge status: Home / Self Care | Payer: BC Managed Care – PPO | Source: Ambulatory Visit | Attending: Physician Assistant | Admitting: Physician Assistant

## 2022-12-31 DIAGNOSIS — R7989 Other specified abnormal findings of blood chemistry: Secondary | ICD-10-CM

## 2023-01-01 DIAGNOSIS — Z1231 Encounter for screening mammogram for malignant neoplasm of breast: Secondary | ICD-10-CM | POA: Diagnosis not present

## 2023-01-01 DIAGNOSIS — Z01419 Encounter for gynecological examination (general) (routine) without abnormal findings: Secondary | ICD-10-CM | POA: Diagnosis not present

## 2023-01-01 DIAGNOSIS — Z1211 Encounter for screening for malignant neoplasm of colon: Secondary | ICD-10-CM | POA: Diagnosis not present

## 2023-01-01 DIAGNOSIS — Z6830 Body mass index (BMI) 30.0-30.9, adult: Secondary | ICD-10-CM | POA: Diagnosis not present

## 2023-01-01 DIAGNOSIS — Z124 Encounter for screening for malignant neoplasm of cervix: Secondary | ICD-10-CM | POA: Diagnosis not present

## 2023-01-01 DIAGNOSIS — K439 Ventral hernia without obstruction or gangrene: Secondary | ICD-10-CM | POA: Diagnosis not present

## 2023-01-09 NOTE — Progress Notes (Signed)
Office Visit Note  Patient: Shelley Morrison             Date of Birth: 1966-07-31           MRN: [id number]             PCP: Javier Docker, MD Referring: Javier Docker, MD Visit Date: 01/23/2023 Occupation: @GUAROCC @  Subjective:  Discuss use of sulfasalazine   History of Present Illness: Shelley Morrison is a 57 y.o. female with history of seropositive rheumatoid arthritis and osteoarthritis.  She is prescribed sulfasalazine 500 mg 2 tablets by mouth twice daily.  Patient reports that she has been holding sulfasalazine for about 1 month.  She states she underwent a right upper quadrant ultrasound and was diagnosed with fatty liver on 12/31/2022.  She states that she was apprehensive to restart on sulfasalazine until she discussed with [place].  She denies any signs or symptoms of a flare while off of sulfasalazine.  She is not experiencing any nocturnal pain or difficulty with ADLs.  Her morning stiffness has been lasting about 15 minutes daily.  She has not needed to take Tylenol recently for pain relief.  She is apprehensive to restart on sulfasalazine since she has clinically been doing well without it. She denies any recent or recurrent infections.     Activities of Daily Living:  Patient reports morning stiffness for 15 minutes.   Patient Denies nocturnal pain.  Difficulty dressing/grooming: Denies Difficulty climbing stairs: Denies Difficulty getting out of chair: Denies Difficulty using hands for taps, buttons, cutlery, and/or writing: Denies  Review of Systems  Constitutional: Negative.  Negative for fatigue.  HENT: Negative.  Negative for mouth sores and mouth dryness.   Eyes:  Positive for dryness.  Respiratory: Negative.  Negative for shortness of breath.   Cardiovascular: Negative.  Negative for chest pain and palpitations.  Gastrointestinal: Negative.  Negative for blood in stool, constipation and diarrhea.  Endocrine: Negative.  Negative for increased urination.   Genitourinary: Negative.  Negative for involuntary urination.  Musculoskeletal:  Positive for joint pain, joint pain and morning stiffness. Negative for gait problem, joint swelling, myalgias, muscle weakness, muscle tenderness and myalgias.  Skin: Negative.  Negative for color change, rash, hair loss and sensitivity to sunlight.  Allergic/Immunologic: Negative for susceptible to infections.  Neurological: Negative.  Negative for dizziness and headaches.  Hematological: Negative.  Negative for swollen glands.  Psychiatric/Behavioral:  Negative for depressed mood and sleep disturbance. The patient is nervous/anxious.     PMFS History:  Patient Active Problem List   Diagnosis Date Noted   Rheumatoid arthritis with rheumatoid factor of multiple sites without organ or systems involvement ([place]) 06/19/2017   High risk medication use 06/18/2017   Vitamin D deficiency 06/18/2017   Primary insomnia 05/19/2017   Essential hypertension 05/19/2017   ANA positive 05/08/2017   Rheumatoid factor positive 05/08/2017    Past Medical History:  Diagnosis Date   Hypertension    Lupus ([place])    Rheumatoid arthritis ([place])     Family History  Problem Relation Age of Onset   Healthy Son    Healthy Daughter    Past Surgical History:  Procedure Laterality Date   CHOLECYSTECTOMY     Social History   Social History Narrative   Not on file   Immunization History  Administered Date(s) Administered   PFIZER(Purple Top)SARS-COV-2 Vaccination 09/24/2020, 10/15/2020     Objective: Vital Signs: BP (!) 159/82 (BP Location: Left Arm, Patient Position: Sitting,  Cuff Size: Normal)   Pulse 74   Resp 16   Ht 5\' 10"  (1.778 m)   Wt 206 lb (93.4 kg)   BMI 29.56 kg/m    Physical Exam Vitals and nursing note reviewed.  Constitutional:      Appearance: She is well-developed.  HENT:     Head: Normocephalic and atraumatic.  Eyes:     Conjunctiva/sclera: Conjunctivae normal.  Cardiovascular:     Rate  and Rhythm: Normal rate and regular rhythm.     Heart sounds: Normal heart sounds.  Pulmonary:     Effort: Pulmonary effort is normal.     Breath sounds: Normal breath sounds.  Abdominal:     General: Bowel sounds are normal.     Palpations: Abdomen is soft.  Musculoskeletal:     Cervical back: Normal range of motion.  Skin:    General: Skin is warm and dry.     Capillary Refill: Capillary refill takes less than 2 seconds.  Neurological:     Mental Status: She is alert and oriented to person, place, and time.  Psychiatric:        Behavior: Behavior normal.      Musculoskeletal Exam: C-spine, thoracic, and lumbar spine good ROM.  Shoulder joints, elbow joints, wrist joints, Mcps, PIPs, and DIPs good ROM with no synovitis.  Complete fist formation bilaterally.  Hip joints, knee joints, and ankle joints have good ROM.  No warmth or effusion of knee joints.  No tenderness or swelling of ankle joints.    CDAI Exam: CDAI Score: -- Patient Global: --; Provider Global: -- Swollen: --; Tender: -- Joint Exam 01/23/2023   No joint exam has been documented for this visit   There is currently no information documented on the homunculus. Go to the Rheumatology activity and complete the homunculus joint exam.  Investigation: No additional findings.  Imaging: US Abdomen Limited RUQ (LIVER/GB)  Result Date: 12/31/2022 CLINICAL DATA:  Elevated LFTs EXAM: ULTRASOUND ABDOMEN LIMITED RIGHT UPPER QUADRANT COMPARISON:  None Available. FINDINGS: Gallbladder: Surgically absent Common bile duct: Diameter: 4 mm Liver: Increased echogenicity. No focal lesion. Portal vein is patent on color Doppler imaging with normal direction of blood flow towards the liver. Other: None. IMPRESSION: 1. Increased hepatic parenchymal echogenicity suggestive of steatosis. 2. Prior cholecystectomy. Electronically Signed   By: Lovey Newcomer M.D.   On: 12/31/2022 10:05    Recent Labs: Lab Results  Component Value Date   WBC  5.7 10/24/2022   HGB 13.4 10/24/2022   PLT 247 10/24/2022   NA 139 10/24/2022   K 4.5 10/24/2022   CL 105 10/24/2022   CO2 28 10/24/2022   GLUCOSE 117 (H) 10/24/2022   BUN 9 10/24/2022   CREATININE 0.85 10/24/2022   BILITOT 0.4 10/24/2022   ALKPHOS 65 05/24/2022   AST 57 (H) 10/24/2022   ALT 83 (H) 10/24/2022   PROT 7.6 10/24/2022   ALBUMIN 4.1 05/24/2022   CALCIUM 9.8 10/24/2022   GFRAA 82 07/03/2021   QFTBGOLDPLUS NEGATIVE 08/16/2021    Speciality Comments: PLQ Eye Exam:11/25/2021 WNL @ [facility] Follow up in 6 months, methotrexate-fatigue nausea and brain fog Arava-hair loss, inadequate response  Procedures:  No procedures performed Allergies: Acetaminophen-codeine   Assessment / Plan:     Visit Diagnoses: Seropositive rheumatoid arthritis of multiple sites [facility]): She has no joint tenderness or synovitis on examination today.  She has not had any signs or symptoms of a rheumatoid arthritis flare.  Her morning stiffness has  been lasting 15 minutes daily.  She has not had any nocturnal pain or difficulty with ADLs.  She has been holding sulfasalazine for over 1 month due to undergoing a right upper quadrant ultrasound on 12/31/2022 which was consistent with fatty liver.  She wanted to discuss with [place] if it was safe to continue sulfasalazine prior to resuming therapy.  Recommended dietary modifications given her new diagnosis of fatty liver.  Discussed that it is okay for her to resume sulfasalazine as prescribed with close lab monitoring.  She is apprehensive to restart on sulfasalazine since she has not had any signs or symptoms of a flare while off of therapy.  Discussed discussed that I would recommend restarting sulfasalazine at the therapeutic dose but for now she would like to try restarting sulfasalazine at 500 mg 1 tablet twice daily.  She was advised to notify us if she develops signs or symptoms of a flare on the reduced dose of sulfasalazine.  She will follow-up in  the office in 3 months or sooner if needed.  High risk medication use - Sulfasalazine 500 mg 1 tablet by mouth twice daily.  Enbrel initiated on 08/29/21-discontinued in July 2023-recurrent infections. CBC and CMP updated on 10/24/22. Orders for CBC and CMP released today.  Her next lab work will be due in April and every 3 months to monitor for drug toxicity. No recent or recurrent infections.  Discussed the importance of holding sulfasalazine if she develops signs or symptoms of an infection and to resume once the infection has completely cleared.    - Plan: COMPLETE METABOLIC PANEL WITH GFR, CBC with Differential/Platelet  Elevated LFTs -Right upper quadrant ultrasound performed on 12/31/2022 consistent with steatosis.  Recommended dietary modifications.  She was also encouraged to continue to try to avoid the use of Tylenol as well as alcohol use.  CMP updated today.  Plan: COMPLETE METABOLIC PANEL WITH GFR  Primary osteoarthritis of both hands: No tenderness or joint swelling.  PIP and DIP thickening noted.  Complete fist formation bilaterally.  Discussed the importance of joint protection and muscle strengthening.  Primary osteoarthritis of left hip: She has good range of motion of the left hip joint on examination today.  No groin pain.  No tenderness over the left trochanteric bursa.  Primary osteoarthritis of both feet: She is not experiencing any discomfort in her feet at this time.  She is wearing proper fitting shoes.  History of vitamin D deficiency: She is taking vitamin D 2000 units daily.  Primary insomnia - She takes trazodone 50 mg at bedtime as needed.  History of hypertension: Blood pressure is 159/82 today in the office.  Blood pressure was rechecked prior to the patient leaving.  She was advised to monitor her blood pressure closely and if it remains elevated she should follow-up with her PCP.  Orders: Orders Placed This Encounter  Procedures   COMPLETE METABOLIC PANEL WITH  GFR   CBC with Differential/Platelet   No orders of the defined types were placed in this encounter.     Follow-Up Instructions: Return in about 3 months (around 04/24/2023) for Rheumatoid arthritis, Osteoarthritis.   Gearldine Bienenstock, PA-C  Note - This record has been created using Dragon software.  Chart creation errors have been sought, but may not always  have been located. Such creation errors do not reflect on  the standard of medical care.

## 2023-01-16 ENCOUNTER — Other Ambulatory Visit: Payer: Self-pay | Admitting: Surgery

## 2023-01-16 DIAGNOSIS — R1900 Intra-abdominal and pelvic swelling, mass and lump, unspecified site: Secondary | ICD-10-CM | POA: Diagnosis not present

## 2023-01-21 DIAGNOSIS — G4700 Insomnia, unspecified: Secondary | ICD-10-CM | POA: Diagnosis not present

## 2023-01-21 DIAGNOSIS — R945 Abnormal results of liver function studies: Secondary | ICD-10-CM | POA: Diagnosis not present

## 2023-01-21 DIAGNOSIS — I10 Essential (primary) hypertension: Secondary | ICD-10-CM | POA: Diagnosis not present

## 2023-01-21 DIAGNOSIS — Z13220 Encounter for screening for lipoid disorders: Secondary | ICD-10-CM | POA: Diagnosis not present

## 2023-01-22 ENCOUNTER — Ambulatory Visit
Admission: RE | Admit: 2023-01-22 | Discharge: 2023-01-22 | Disposition: A | Discharge status: Home / Self Care | Payer: BC Managed Care – PPO | Source: Ambulatory Visit | Attending: Surgery | Admitting: Surgery

## 2023-01-22 DIAGNOSIS — R1900 Intra-abdominal and pelvic swelling, mass and lump, unspecified site: Secondary | ICD-10-CM

## 2023-01-22 DIAGNOSIS — I700 Atherosclerosis of aorta: Secondary | ICD-10-CM | POA: Diagnosis not present

## 2023-01-22 DIAGNOSIS — Z9049 Acquired absence of other specified parts of digestive tract: Secondary | ICD-10-CM | POA: Diagnosis not present

## 2023-01-22 DIAGNOSIS — D259 Leiomyoma of uterus, unspecified: Secondary | ICD-10-CM | POA: Diagnosis not present

## 2023-01-22 MED ORDER — IOPAMIDOL (ISOVUE-370) INJECTION 76%
80.0000 mL | Freq: Once | INTRAVENOUS | Status: AC | PRN
  Administered 2023-01-22: 80 mL via INTRAVENOUS

## 2023-01-23 ENCOUNTER — Encounter: Payer: Self-pay | Admitting: Physician Assistant

## 2023-01-23 ENCOUNTER — Ambulatory Visit: Payer: BC Managed Care – PPO | Attending: Physician Assistant | Admitting: Physician Assistant

## 2023-01-23 VITALS — BP 159/82 | HR 74 | Resp 16 | Ht 70.0 in | Wt 206.0 lb

## 2023-01-23 DIAGNOSIS — M19041 Primary osteoarthritis, right hand: Secondary | ICD-10-CM | POA: Diagnosis not present

## 2023-01-23 DIAGNOSIS — M0579 Rheumatoid arthritis with rheumatoid factor of multiple sites without organ or systems involvement: Secondary | ICD-10-CM

## 2023-01-23 DIAGNOSIS — M19072 Primary osteoarthritis, left ankle and foot: Secondary | ICD-10-CM

## 2023-01-23 DIAGNOSIS — Z79899 Other long term (current) drug therapy: Secondary | ICD-10-CM | POA: Diagnosis not present

## 2023-01-23 DIAGNOSIS — Z8639 Personal history of other endocrine, nutritional and metabolic disease: Secondary | ICD-10-CM

## 2023-01-23 DIAGNOSIS — M1612 Unilateral primary osteoarthritis, left hip: Secondary | ICD-10-CM

## 2023-01-23 DIAGNOSIS — M19042 Primary osteoarthritis, left hand: Secondary | ICD-10-CM

## 2023-01-23 DIAGNOSIS — M19071 Primary osteoarthritis, right ankle and foot: Secondary | ICD-10-CM

## 2023-01-23 DIAGNOSIS — R7989 Other specified abnormal findings of blood chemistry: Secondary | ICD-10-CM | POA: Diagnosis not present

## 2023-01-23 DIAGNOSIS — F5101 Primary insomnia: Secondary | ICD-10-CM

## 2023-01-23 DIAGNOSIS — Z8679 Personal history of other diseases of the circulatory system: Secondary | ICD-10-CM

## 2023-01-23 NOTE — Patient Instructions (Signed)
Standing Labs We placed an order today for your standing lab work.   Please have your standing labs drawn in April and every 3 months   Please have your labs drawn 2 weeks prior to your appointment so that the provider can discuss your lab results at your appointment.  Please note that you may see your imaging and lab results in [place] before we have reviewed them. We will contact you once all results are reviewed. Please allow our office up to 72 hours to thoroughly review all of the results before contacting the office for clarification of your results.  Lab hours are:   Monday through Thursday from 8:00 am -12:30 pm and 1:00 pm-5:00 pm and Friday from 8:00 am-12:00 pm.  Please be advised, all patients with office appointments requiring lab work will take precedent over walk-in lab work.   Labs are drawn by [facility]. Please bring your co-pay at the time of your lab draw.  You may receive a bill from [facility] for your lab work.  Please note if you are on Hydroxychloroquine and and an order has been placed for a Hydroxychloroquine level, you will need to have it drawn 4 hours or more after your last dose.  If you wish to have your labs drawn at another location, please call the office 24 hours in advance so we can fax the orders.  The office is located at [address] [place], [place], [place] [address] No appointment is necessary.    If you have any questions regarding directions or hours of operation,  please call [phone].   As a reminder, please drink plenty of water prior to coming for your lab work. Thanks!

## 2023-01-24 LAB — COMPLETE METABOLIC PANEL WITH GFR
AG Ratio: 1.2 (calc) (ref 1.0–2.5)
ALT: 121 U/L — ABNORMAL HIGH (ref 6–29)
AST: 78 U/L — ABNORMAL HIGH (ref 10–35)
Albumin: 4.1 g/dL (ref 3.6–5.1)
Alkaline phosphatase (APISO): 81 U/L (ref 37–153)
BUN: 8 mg/dL (ref 7–25)
CO2: 27 mmol/L (ref 20–32)
Calcium: 9.6 mg/dL (ref 8.6–10.4)
Chloride: 104 mmol/L (ref 98–110)
Creat: 0.74 mg/dL (ref 0.50–1.03)
Globulin: 3.5 g/dL (calc) (ref 1.9–3.7)
Glucose, Bld: 138 mg/dL — ABNORMAL HIGH (ref 65–99)
Potassium: 4.6 mmol/L (ref 3.5–5.3)
Sodium: 138 mmol/L (ref 135–146)
Total Bilirubin: 0.3 mg/dL (ref 0.2–1.2)
Total Protein: 7.6 g/dL (ref 6.1–8.1)
eGFR: 95 mL/min/{1.73_m2} (ref >=60)

## 2023-01-24 LAB — CBC WITH DIFFERENTIAL/PLATELET
Absolute Monocytes: 594 cells/uL (ref 200–950)
Basophils Absolute: 28 cells/uL (ref 0–200)
Basophils Relative: 0.5 %
Eosinophils Absolute: 110 cells/uL (ref 15–500)
Eosinophils Relative: 2 %
HCT: 40.5 % (ref 35.0–45.0)
Hemoglobin: 13.2 g/dL (ref 11.7–15.5)
Lymphs Abs: 2217 cells/uL (ref 850–3900)
MCH: 28.4 pg (ref 27.0–33.0)
MCHC: 32.6 g/dL (ref 32.0–36.0)
MCV: 87.3 fL (ref 80.0–100.0)
MPV: 10.7 fL (ref 7.5–12.5)
Monocytes Relative: 10.8 %
Neutro Abs: 2552 cells/uL (ref 1500–7800)
Neutrophils Relative %: 46.4 %
Platelets: 242 10*3/uL (ref 140–400)
RBC: 4.64 10*6/uL (ref 3.80–5.10)
RDW: 12.2 % (ref 11.0–15.0)
Total Lymphocyte: 40.3 %
WBC: 5.5 10*3/uL (ref 3.8–10.8)

## 2023-01-26 ENCOUNTER — Encounter: Payer: Self-pay | Admitting: Surgery

## 2023-01-26 NOTE — Progress Notes (Signed)
CBC WNL.  Glucose is 138.  LFTs remain elevated but have trended up to AST 78 and ALT 121.  Please forward labs to GI.  She recently underwent a RUQ ultrasound revealing steatosis.  Lab work will continue to be monitored closely.  She should avoid the use of tylenol and alcohol.

## 2023-01-30 DIAGNOSIS — Z13220 Encounter for screening for lipoid disorders: Secondary | ICD-10-CM | POA: Diagnosis not present

## 2023-01-30 DIAGNOSIS — I10 Essential (primary) hypertension: Secondary | ICD-10-CM | POA: Diagnosis not present

## 2023-03-16 DIAGNOSIS — R7401 Elevation of levels of liver transaminase levels: Secondary | ICD-10-CM | POA: Diagnosis not present

## 2023-03-16 DIAGNOSIS — K760 Fatty (change of) liver, not elsewhere classified: Secondary | ICD-10-CM | POA: Diagnosis not present

## 2023-03-16 DIAGNOSIS — N941 Unspecified dyspareunia: Secondary | ICD-10-CM | POA: Diagnosis not present

## 2023-03-16 DIAGNOSIS — R350 Frequency of micturition: Secondary | ICD-10-CM | POA: Diagnosis not present

## 2023-03-16 DIAGNOSIS — Z86010 Personal history of colonic polyps: Secondary | ICD-10-CM | POA: Diagnosis not present

## 2023-03-16 DIAGNOSIS — D259 Leiomyoma of uterus, unspecified: Secondary | ICD-10-CM | POA: Diagnosis not present

## 2023-03-16 IMAGING — DX DG CHEST 1V PORT
1 series · 1 of 1 positions shown · non-contrast
Comparison: 05/27/2017

CLINICAL DATA: Cough, body aches and fevers.

EXAM:
PORTABLE CHEST 1 VIEW

[chest ap]
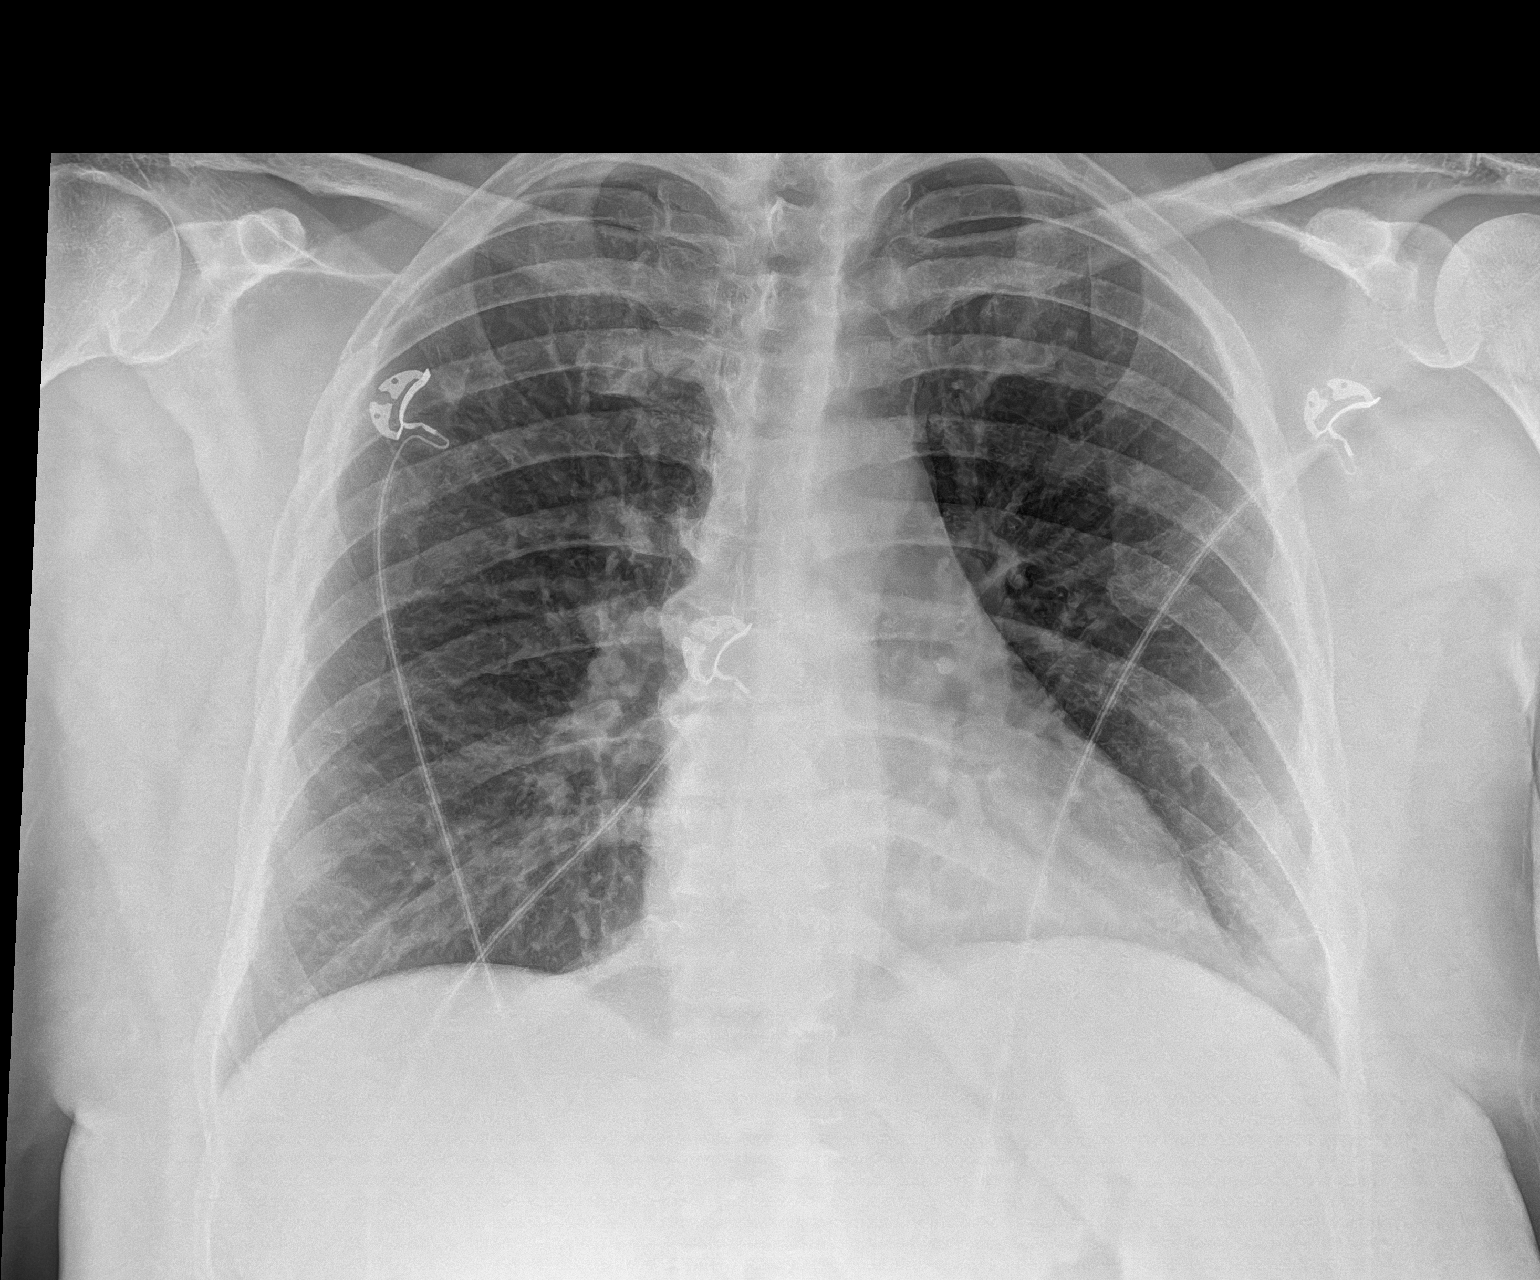

[1 of 1 positions shown; findings below may reference images not displayed]

FINDINGS: The heart size and mediastinal contours are within normal limits.
Both lungs are clear. The visualized skeletal structures are
unremarkable.
IMPRESSION: No active disease.

## 2023-04-10 NOTE — Progress Notes (Deleted)
Office Visit Note  Patient: Shelley Morrison             Date of Birth: [birth year]           MRN: [id number]             PCP: Gilda Crease, MD Referring: Gilda Crease, MD Visit Date: 04/24/2023 Occupation: @  Subjective:  No chief complaint on file.   History of Present Illness: Shelley Morrison is a 57 y.o. female ***     Activities of Daily Living:  Patient reports morning stiffness for *** {minute/hour:19697}.   Patient {ACTIONS;DENIES/REPORTS:21021675::"Denies"} nocturnal pain.  Difficulty dressing/grooming: {ACTIONS;DENIES/REPORTS:21021675::"Denies"} Difficulty climbing stairs: {ACTIONS;DENIES/REPORTS:21021675::"Denies"} Difficulty getting out of chair: {ACTIONS;DENIES/REPORTS:21021675::"Denies"} Difficulty using hands for taps, buttons, cutlery, and/or writing: {ACTIONS;DENIES/REPORTS:21021675::"Denies"}  No Rheumatology ROS completed.   PMFS History:  Patient Active Problem List   Diagnosis Date Noted   Rheumatoid arthritis with rheumatoid factor of multiple sites without organ or systems involvement 06/19/2017   High risk medication use 06/18/2017   Vitamin D deficiency 06/18/2017   Primary insomnia 05/19/2017   Essential hypertension 05/19/2017   ANA positive 05/08/2017   Rheumatoid factor positive 05/08/2017    Past Medical History:  Diagnosis Date   Hypertension    Lupus (HCC)    Rheumatoid arthritis (HCC)     Family History  Problem Relation Age of Onset   Healthy Son    Healthy Daughter    Past Surgical History:  Procedure Laterality Date   CHOLECYSTECTOMY     Social History   Social History Narrative   Not on file   Immunization History  Administered Date(s) Administered   PFIZER(Purple Top)SARS-COV-2 Vaccination 09/24/2020, 10/15/2020     Objective: Vital Signs: There were no vitals taken for this visit.   Physical Exam   Musculoskeletal Exam: ***  CDAI Exam: CDAI Score: -- Patient Global: --; Provider Global:  -- Swollen: --; Tender: -- Joint Exam 04/24/2023   No joint exam has been documented for this visit   There is currently no information documented on the homunculus. Go to the Rheumatology activity and complete the homunculus joint exam.  Investigation: No additional findings.  Imaging: No results found.  Recent Labs: Lab Results  Component Value Date   WBC 5.5 01/23/2023   HGB 13.2 01/23/2023   PLT 242 01/23/2023   NA 138 01/23/2023   K 4.6 01/23/2023   CL 104 01/23/2023   CO2 27 01/23/2023   GLUCOSE 138 (H) 01/23/2023   BUN 8 01/23/2023   CREATININE 0.74 01/23/2023   BILITOT 0.3 01/23/2023   ALKPHOS 65 05/24/2022   AST 78 (H) 01/23/2023   ALT 121 (H) 01/23/2023   PROT 7.6 01/23/2023   ALBUMIN 4.1 05/24/2022   CALCIUM 9.6 01/23/2023   GFRAA 82 07/03/2021   QFTBGOLDPLUS NEGATIVE 08/16/2021    Speciality Comments: PLQ Eye Exam:11/25/2021 WNL @ [facility] Follow up in 6 months, methotrexate-fatigue nausea and brain fog Arava-hair loss, inadequate response  Procedures:  No procedures performed Allergies: Acetaminophen-codeine   Assessment / Plan:     Visit Diagnoses: Seropositive rheumatoid arthritis of multiple sites  High risk medication use  Elevated LFTs  Primary osteoarthritis of both hands  Primary osteoarthritis of left hip  Primary osteoarthritis of both feet  History of vitamin D deficiency  Primary insomnia  History of hypertension  Orders: No orders of the defined types were placed in this encounter.  No orders of the defined types were placed in this encounter.  Face-to-face time spent with patient was *** minutes. Greater than 50% of time was spent in counseling and coordination of care.  Follow-Up Instructions: No follow-ups on file.   Gearldine Bienenstock, PA-C  Note - This record has been created using Dragon software.  Chart creation errors have been sought, but may not always  have been located. Such creation errors do  not reflect on  the standard of medical care.

## 2023-04-15 NOTE — H&P (Signed)
Shelley Morrison is a 57 y.o.  female, P: 2-0-0-2 presents for hysterectomy because of symptomatic uterine fibroids. The patient gives a long history of heavy and painful menstrual periods until menopause in 2014. She has known that she had uterine fibroids for many years but once her periods stopped, their effect were less of a concern. Over time however, the patient noticed worsening urinary frequency and constipation that, after evaluation, could only be attributed to the mass effect of her fibroids. The patient goes on to report severe dyspareunia such that she no longer active. She denies any pelvic pain, vaginitis symptoms, urinary urgency, incontinence  or abnormal uterine bleeding. A pelvic ultrasound, March 16, 2023 revealed: a uterine volume of 145 cc with #4 measurable fibroids: left lateral pedunculated-4.4 cm, left lateral subserosal -4.3 cm, fundal pedunculated-2.6 cm and submucosal-3.6 cm, endometrium obscured by submucosal fibroid, right ovary-2.19 cm and left ovary-2.44 cm. An endometrial biopsy performed April 17, 2023 returned benign findings with no hyperplasia or malignancy.  Given the chronicity and disruptive nature of her symptoms the patient wishes to proceed with definitive therapy in the form of hysterectomy.  Past Medical History  OB History: G: 2;  P: 2-0-0-2,  SVB: 1988 and 1997 with largest infant weighing 7 lbs. 8 oz.  GYN History: menarche: 57 YO;    LMP: 2014    Contraception;  Denies history of abnormal PAP smear . Last PAP smear-January 2024-normal  Medical History: Hypertension, Rheumatoid Arthritis, Adenomatous Polyps, Fatty Liver, Vitamin D and Lupus  Surgical History:  1997 Cholecystectomy Denies problems with anesthesia or history of blood transfusions  Family History: Colon Cancer and Hypertension  Social History: Single and employed in [employer];  Former Smoker and denies alcohol use   Medications: Atenolol 25 mg daily Flexeril 10 mg tid  prn Losartan 50 mg daily Trazodone 100 mg qhs Vitamin D daily  Allergies  Allergen Reactions   Acetaminophen-Codeine     Most narcotic per patient     Denies sensitivity to peanuts, shellfish, soy, latex or adhesives.   ROS: Admits to glasses, dentures in upper and lower jaw, constipation, urinary frequency and right hand joint swelling but denies headache, vision changes, nasal congestion, dysphagia, tinnitus, dizziness, hoarseness, cough,  chest pain, shortness of breath, nausea, vomiting, diarrhea,  urgency  dysuria, hematuria, vaginitis symptoms, pelvic pain, swelling of joints,easy bruising,  myalgias, arthralgias, skin rashes, unexplained weight loss and except as is mentioned in the history of present illness, patient's review of systems is otherwise negative.    Physical Exam  Bp: 132/78;  Weight: 210 lbs.; Height: 5'10";  BMI: 30.1  Neck: supple without masses or thyromegaly Lungs: clear to auscultation Heart: regular rate and rhythm Abdomen: soft, non-tender and no organomegaly Pelvic:EGBUS- wnl; vagina-normal rugae; uterus-normal size, cervix without lesions or motion tenderness; adnexae-no tenderness or masses Extremities:  no clubbing, cyanosis or edema   Assesment: Symptomatic Uterine Fibroids   Disposition:  The robot-assisted hysterectomy was reviewed with the patient along with the indication for her procedure. Benefits of the robotic approach include lesser postoperative pain, less blood loss during surgery, reduced risk of injury to other organs, due to better visualization with a 3-D HD 10 times magnifying camera; shorter hospital stay between 0-1 night and rapid recovery with return to daily routine in 2-3 weeks (however, nothing in the vagina for 6 weeks). Although the robot-assisted hysterectomy has a longer operative time than traditional laparotomy, the benefits usually outweigh the risks, in a patient with good medical  history,   Risks include bleeding,  infection, injury to other organs (bladder more vulnerable if previous cesarean delivery) , need for laparotomy, transient post-operative facial edema, increased risk of pelvic prolapse (associated with any hysterectomy) as well as earlier onset of menopause. Preservation of the ovaries was also reviewed and recommended. Finally, we recommended bilateral salpingectomy for lifelong reduction of ovarian cancer. A Miralax Bowel Prep was given to the patient to complete the day before her surgery.  The patient's questions were answered and she verbalized understanding of the risks and pre-operative instructions. The patient has consented to proceed with a Robot Assisted Laparoscopic Hysterectomy with Bilateral Salpingectomy at [facility] on Apr 30, 2023   CSN# [id number]   Donnie Gedeon J. Lowell Guitar, PA-C  for Dr. Crist Fat. Rivard

## 2023-04-16 ENCOUNTER — Other Ambulatory Visit: Payer: Self-pay

## 2023-04-16 ENCOUNTER — Encounter (HOSPITAL_BASED_OUTPATIENT_CLINIC_OR_DEPARTMENT_OTHER): Payer: Self-pay | Admitting: Obstetrics and Gynecology

## 2023-04-16 DIAGNOSIS — D259 Leiomyoma of uterus, unspecified: Secondary | ICD-10-CM | POA: Diagnosis not present

## 2023-04-16 DIAGNOSIS — Z01818 Encounter for other preprocedural examination: Secondary | ICD-10-CM | POA: Diagnosis not present

## 2023-04-16 NOTE — Progress Notes (Signed)
Your procedure is scheduled on Thursday, 04/30/2023.  Report to [facility] AT  7:30 AM.   Call this number if you have problems the morning of surgery  [phone].   OUR ADDRESS IS [address].  [facility] ARE LOCATED IN [facility].  PLEASE BRING YOUR INSURANCE CARD AND PHOTO ID DAY OF SURGERY.  ONLY 2 PEOPLE ARE ALLOWED IN  WAITING  ROOM                                      REMEMBER:  DO NOT EAT FOOD, CANDY GUM OR MINTS  AFTER MIDNIGHT THE NIGHT BEFORE YOUR SURGERY . YOU MAY HAVE CLEAR LIQUIDS FROM MIDNIGHT THE NIGHT BEFORE YOUR SURGERY UNTIL  6:30 AM. NO CLEAR LIQUIDS AFTER   6:30 AM DAY OF SURGERY.  YOU MAY  BRUSH YOUR TEETH MORNING OF SURGERY AND RINSE YOUR MOUTH OUT, NO CHEWING GUM CANDY OR MINTS.     CLEAR LIQUID DIET    Allowed      Water                                                                   Coffee and tea, regular and decaf  (NO cream or milk products of any type, may sweeten)                         Carbonated beverages, regular and diet                                    Sports drinks like Gatorade _____________________________________________________________________     TAKE ONLY THESE MEDICATIONS MORNING OF SURGERY:  Atenolol                                        DO NOT WEAR JEWERLY/  METAL/  PIERCINGS (INCLUDING NO PLASTIC PIERCINGS) DO NOT WEAR LOTIONS, POWDERS, PERFUMES OR NAIL POLISH ON YOUR FINGERNAILS. TOENAIL POLISH IS OK TO WEAR. DO NOT SHAVE FOR 48 HOURS PRIOR TO DAY OF SURGERY.  CONTACTS, GLASSES, OR DENTURES MAY NOT BE WORN TO SURGERY.  REMEMBER: NO SMOKING, VAPING ,  DRUGS OR ALCOHOL FOR 24 HOURS BEFORE YOUR SURGERY.                                    [facility] IS NOT RESPONSIBLE  FOR ANY BELONGINGS.                                                                    Marland Kitchen           [facility] - Preparing for Surgery Before  surgery, you can play an important role.  Because skin is not sterile,  your skin needs to be as free of germs as possible.  You can reduce the number of germs on your skin by washing with CHG (chlorahexidine gluconate) soap before surgery.  CHG is an antiseptic cleaner which kills germs and bonds with the skin to continue killing germs even after washing. Please DO NOT use if you have an allergy to CHG or antibacterial soaps.  If your skin becomes reddened/irritated stop using the CHG and inform your nurse when you arrive at Short Stay. Do not shave (including legs and underarms) for at least 48 hours prior to the first CHG shower.  You may shave your face/neck. Please follow these instructions carefully:  1.  Shower with CHG Soap the night before surgery and the  morning of Surgery.  2.  If you choose to wash your hair, wash your hair first as usual with your  normal  shampoo.  3.  After you shampoo, rinse your hair and body thoroughly to remove the  shampoo.                                        4.  Use CHG as you would any other liquid soap.  You can apply chg directly  to the skin and wash , chg soap provided, night before and morning of your surgery.  5.  Apply the CHG Soap to your body ONLY FROM THE NECK DOWN.   Do not use on face/ open                           Wound or open sores. Avoid contact with eyes, ears mouth and genitals (private parts).                       Wash face,  Genitals (private parts) with your normal soap.             6.  Wash thoroughly, paying special attention to the area where your surgery  will be performed.  7.  Thoroughly rinse your body with warm water from the neck down.  8.  DO NOT shower/wash with your normal soap after using and rinsing off  the CHG Soap.             9.  Pat yourself dry with a clean towel.            10.  Wear clean pajamas.            11.  Place clean sheets on your bed the night of your first shower and do not  sleep with pets. Day of Surgery : Do not apply any lotions/ powders the morning of surgery.   Please wear clean clothes to the hospital/surgery center.  IF YOU HAVE ANY SKIN IRRITATION OR PROBLEMS WITH THE SURGICAL SOAP, PLEASE GET A BAR OF GOLD DIAL SOAP AND SHOWER THE NIGHT BEFORE YOUR SURGERY AND THE MORNING OF YOUR SURGERY. PLEASE LET THE NURSE KNOW MORNING OF YOUR SURGERY IF YOU HAD ANY PROBLEMS WITH THE SURGICAL SOAP.   YOUR SURGEON MAY HAVE REQUESTED EXTENDED RECOVERY TIME AFTER YOUR SURGERY. IT COULD BE A  JUST A FEW HOURS  UP TO AN OVERNIGHT STAY.  YOUR SURGEON SHOULD HAVE DISCUSSED THIS WITH YOU PRIOR TO YOUR  SURGERY. IN THE EVENT YOU NEED TO STAY OVERNIGHT PLEASE REFER TO THE FOLLOWING GUIDELINES. YOU MAY HAVE UP TO 4 VISITORS  MAY VISIT IN THE EXTENDED RECOVERY ROOM UNTIL 800 PM ONLY.  ONE  VISITOR AGE 10 AND OVER MAY SPEND THE NIGHT AND MUST BE IN EXTENDED RECOVERY ROOM NO LATER THAN 800 PM . YOUR DISCHARGE TIME AFTER YOU SPEND THE NIGHT IS 900 AM THE MORNING AFTER YOUR SURGERY. YOU MAY PACK A SMALL OVERNIGHT BAG WITH TOILETRIES FOR YOUR OVERNIGHT STAY IF YOU WISH.  REGARDLESS OF IF YOU STAY OVER NIGHT OR ARE DISCHARGED THE SAME DAY YOU WILL BE REQUIRED TO HAVE A RESPONSIBLE ADULT (18 YRS OLD OR OLDER) STAY WITH YOU FOR AT LEAST THE FIRST 24 HOURS  YOUR PRESCRIPTION MEDICATIONS WILL BE PROVIDED DURING YOUR HOSPITAL STAY.  ________________________________________________________________________                                                        QUESTIONS Shelley Morrison PRE OP NURSE PHONE [phone].

## 2023-04-16 NOTE — Progress Notes (Signed)
Spoke w/ via phone for pre-op interview---[facility] Lab needs dos---- none              Lab results------04/27/23 lab appt for cbc, cmp, type & screen, EKG, 05/24/22 chest xray in Epic COVID test -----patient states asymptomatic no test needed Arrive at -------0730 on Thursday, 04/30/2023 NPO after MN NO Solid Food.  Clear liquids from MN until---0630 Med rec completed Medications to take morning of surgery -----Atenolol Diabetic medication -----n/a Patient instructed no nail polish to be worn day of surgery Patient instructed to bring photo id and insurance card day of surgery  Patient aware to have Driver (ride ) / caregiver    for 24 hours after surgery - sister, Serita Grit Patient Special Instructions -----Extended/ overnight stay instructions given. Pre-Op special Instructions -----Patient is aware that dentures must be removed prior to OR. Patient verbalized understanding of instructions that were given at this phone interview. Patient denies shortness of breath, chest pain, fever, cough at this phone interview.

## 2023-04-17 DIAGNOSIS — D259 Leiomyoma of uterus, unspecified: Secondary | ICD-10-CM | POA: Diagnosis not present

## 2023-04-24 ENCOUNTER — Ambulatory Visit: Payer: BC Managed Care – PPO | Admitting: Physician Assistant

## 2023-04-24 DIAGNOSIS — Z8679 Personal history of other diseases of the circulatory system: Secondary | ICD-10-CM

## 2023-04-24 DIAGNOSIS — M19071 Primary osteoarthritis, right ankle and foot: Secondary | ICD-10-CM

## 2023-04-24 DIAGNOSIS — M19041 Primary osteoarthritis, right hand: Secondary | ICD-10-CM

## 2023-04-24 DIAGNOSIS — Z79899 Other long term (current) drug therapy: Secondary | ICD-10-CM

## 2023-04-24 DIAGNOSIS — M0579 Rheumatoid arthritis with rheumatoid factor of multiple sites without organ or systems involvement: Secondary | ICD-10-CM

## 2023-04-24 DIAGNOSIS — R7989 Other specified abnormal findings of blood chemistry: Secondary | ICD-10-CM

## 2023-04-24 DIAGNOSIS — Z8639 Personal history of other endocrine, nutritional and metabolic disease: Secondary | ICD-10-CM

## 2023-04-24 DIAGNOSIS — F5101 Primary insomnia: Secondary | ICD-10-CM

## 2023-04-24 DIAGNOSIS — M1612 Unilateral primary osteoarthritis, left hip: Secondary | ICD-10-CM

## 2023-04-27 ENCOUNTER — Encounter (HOSPITAL_COMMUNITY)
Admission: RE | Admit: 2023-04-27 | Discharge: 2023-04-27 | Disposition: A | Discharge status: Home / Self Care | Payer: BC Managed Care – PPO | Source: Ambulatory Visit | Attending: Obstetrics and Gynecology | Admitting: Obstetrics and Gynecology

## 2023-04-27 DIAGNOSIS — I10 Essential (primary) hypertension: Secondary | ICD-10-CM

## 2023-04-27 DIAGNOSIS — D259 Leiomyoma of uterus, unspecified: Secondary | ICD-10-CM

## 2023-04-27 DIAGNOSIS — K760 Fatty (change of) liver, not elsewhere classified: Secondary | ICD-10-CM

## 2023-04-27 DIAGNOSIS — Z01818 Encounter for other preprocedural examination: Secondary | ICD-10-CM

## 2023-04-27 LAB — COMPREHENSIVE METABOLIC PANEL
ALT: 82 U/L — ABNORMAL HIGH (ref 0–44)
AST: 53 U/L — ABNORMAL HIGH (ref 15–41)
Albumin: 3.6 g/dL (ref 3.5–5.0)
Alkaline Phosphatase: 64 U/L (ref 38–126)
Anion gap: 5 (ref 5–15)
BUN: 8 mg/dL (ref 6–20)
CO2: 27 mmol/L (ref 22–32)
Calcium: 9 mg/dL (ref 8.9–10.3)
Chloride: 104 mmol/L (ref 98–111)
Creatinine, Ser: 0.86 mg/dL (ref 0.44–1.00)
GFR, Estimated: >60 mL/min (ref >60)
Glucose, Bld: 118 mg/dL — ABNORMAL HIGH (ref 70–99)
Potassium: 4 mmol/L (ref 3.5–5.1)
Sodium: 136 mmol/L (ref 135–145)
Total Bilirubin: 0.5 mg/dL (ref 0.3–1.2)
Total Protein: 7.6 g/dL (ref 6.5–8.1)

## 2023-04-27 LAB — CBC
HCT: 41.1 % (ref 36.0–46.0)
Hemoglobin: 13 g/dL (ref 12.0–15.0)
MCH: 28.8 pg (ref 26.0–34.0)
MCHC: 31.6 g/dL (ref 30.0–36.0)
MCV: 90.9 fL (ref 80.0–100.0)
Platelets: 222 10*3/uL (ref 150–400)
RBC: 4.52 MIL/uL (ref 3.87–5.11)
RDW: 12.7 % (ref 11.5–15.5)
WBC: 4.9 10*3/uL (ref 4.0–10.5)
nRBC: 0 % (ref 0.0–0.2)

## 2023-04-28 LAB — TYPE AND SCREEN: ABO/RH(D): B POS

## 2023-04-30 ENCOUNTER — Other Ambulatory Visit: Payer: Self-pay

## 2023-04-30 ENCOUNTER — Ambulatory Visit (HOSPITAL_BASED_OUTPATIENT_CLINIC_OR_DEPARTMENT_OTHER)
Admission: RE | Admit: 2023-04-30 | Discharge: 2023-04-30 | Disposition: A | Discharge status: Home / Self Care | Payer: BC Managed Care – PPO | Source: Ambulatory Visit | Attending: Obstetrics and Gynecology | Admitting: Obstetrics and Gynecology

## 2023-04-30 ENCOUNTER — Encounter (HOSPITAL_BASED_OUTPATIENT_CLINIC_OR_DEPARTMENT_OTHER): Payer: Self-pay | Admitting: Obstetrics and Gynecology

## 2023-04-30 ENCOUNTER — Encounter (HOSPITAL_BASED_OUTPATIENT_CLINIC_OR_DEPARTMENT_OTHER)
Admission: RE | Disposition: A | Discharge status: Home / Self Care | Payer: Self-pay | Source: Ambulatory Visit | Attending: Obstetrics and Gynecology

## 2023-04-30 ENCOUNTER — Ambulatory Visit (HOSPITAL_BASED_OUTPATIENT_CLINIC_OR_DEPARTMENT_OTHER): Payer: BC Managed Care – PPO | Admitting: Anesthesiology

## 2023-04-30 DIAGNOSIS — Z8249 Family history of ischemic heart disease and other diseases of the circulatory system: Secondary | ICD-10-CM | POA: Insufficient documentation

## 2023-04-30 DIAGNOSIS — Z87891 Personal history of nicotine dependence: Secondary | ICD-10-CM | POA: Diagnosis not present

## 2023-04-30 DIAGNOSIS — I10 Essential (primary) hypertension: Secondary | ICD-10-CM

## 2023-04-30 DIAGNOSIS — M069 Rheumatoid arthritis, unspecified: Secondary | ICD-10-CM | POA: Diagnosis not present

## 2023-04-30 DIAGNOSIS — D259 Leiomyoma of uterus, unspecified: Secondary | ICD-10-CM | POA: Diagnosis present

## 2023-04-30 DIAGNOSIS — N888 Other specified noninflammatory disorders of cervix uteri: Secondary | ICD-10-CM | POA: Insufficient documentation

## 2023-04-30 DIAGNOSIS — Z79899 Other long term (current) drug therapy: Secondary | ICD-10-CM | POA: Diagnosis not present

## 2023-04-30 DIAGNOSIS — Z01818 Encounter for other preprocedural examination: Secondary | ICD-10-CM

## 2023-04-30 DIAGNOSIS — D251 Intramural leiomyoma of uterus: Secondary | ICD-10-CM | POA: Insufficient documentation

## 2023-04-30 DIAGNOSIS — K760 Fatty (change of) liver, not elsewhere classified: Secondary | ICD-10-CM

## 2023-04-30 HISTORY — DX: Other specified abnormal findings of blood chemistry: R79.89

## 2023-04-30 HISTORY — DX: Presence of dental prosthetic device (complete) (partial): Z97.2

## 2023-04-30 HISTORY — DX: Presence of spectacles and contact lenses: Z97.3

## 2023-04-30 HISTORY — DX: Personal history of colonic polyps: Z86.010

## 2023-04-30 HISTORY — PX: VAGINAL HYSTERECTOMY: SHX2639

## 2023-04-30 HISTORY — DX: Personal history of adenomatous and serrated colon polyps: Z86.0101

## 2023-04-30 HISTORY — DX: Fatty (change of) liver, not elsewhere classified: K76.0

## 2023-04-30 HISTORY — PX: ROBOTIC ASSISTED LAPAROSCOPIC HYSTERECTOMY AND SALPINGECTOMY: SHX6379

## 2023-04-30 LAB — TYPE AND SCREEN: Antibody Screen: NEGATIVE

## 2023-04-30 LAB — ABO/RH: ABO/RH(D): B POS

## 2023-04-30 SURGERY — XI ROBOTIC ASSISTED LAPAROSCOPIC HYSTERECTOMY AND SALPINGECTOMY
Anesthesia: General | Site: Vagina

## 2023-04-30 MED ORDER — ACETAMINOPHEN 500 MG PO TABS: ORAL_TABLET | ORAL | 1 refills | Status: DC

## 2023-04-30 MED ORDER — FENTANYL CITRATE (PF) 100 MCG/2ML IJ SOLN
INTRAMUSCULAR | Status: DC | PRN
  Administered 2023-04-30: 50 ug via INTRAVENOUS

## 2023-04-30 MED ORDER — DOCUSATE SODIUM 100 MG PO CAPS
ORAL_CAPSULE | ORAL | Status: AC
  Filled 2023-04-30: qty 1

## 2023-04-30 MED ORDER — MENTHOL 3 MG MT LOZG: 1.0000 | LOZENGE | OROMUCOSAL | Status: DC | PRN

## 2023-04-30 MED ORDER — IBUPROFEN 600 MG PO TABS: ORAL_TABLET | ORAL | 1 refills | Status: DC

## 2023-04-30 MED ORDER — SODIUM CHLORIDE (PF) 0.9 % IJ SOLN
INTRAMUSCULAR | Status: DC | PRN
  Administered 2023-04-30: 10 mL via SURGICAL_CAVITY

## 2023-04-30 MED ORDER — KETAMINE HCL 50 MG/5ML IJ SOSY
PREFILLED_SYRINGE | INTRAMUSCULAR | Status: AC
  Filled 2023-04-30: qty 5

## 2023-04-30 MED ORDER — SCOPOLAMINE 1 MG/3DAYS TD PT72
1.0000 | MEDICATED_PATCH | TRANSDERMAL | Status: DC
  Administered 2023-04-30: 1.5 mg via TRANSDERMAL

## 2023-04-30 MED ORDER — ACETAMINOPHEN 160 MG/5ML PO SOLN: 325.0000 mg | ORAL | Status: DC | PRN

## 2023-04-30 MED ORDER — SUGAMMADEX SODIUM 200 MG/2ML IV SOLN
INTRAVENOUS | Status: DC | PRN
  Administered 2023-04-30: 200 mg via INTRAVENOUS

## 2023-04-30 MED ORDER — VASOPRESSIN 20 UNIT/ML IV SOLN
INTRAVENOUS | Status: DC | PRN
  Administered 2023-04-30: 16 mL via SURGICAL_CAVITY

## 2023-04-30 MED ORDER — OXYCODONE HCL 5 MG PO TABS
ORAL_TABLET | ORAL | Status: AC
  Filled 2023-04-30: qty 1

## 2023-04-30 MED ORDER — FENTANYL CITRATE (PF) 100 MCG/2ML IJ SOLN
INTRAMUSCULAR | Status: AC
  Filled 2023-04-30: qty 2

## 2023-04-30 MED ORDER — ACETAMINOPHEN 500 MG PO TABS
1000.0000 mg | ORAL_TABLET | ORAL | Status: AC
  Administered 2023-04-30: 1000 mg via ORAL

## 2023-04-30 MED ORDER — ACETAMINOPHEN 325 MG PO TABS
ORAL_TABLET | ORAL | Status: AC
  Filled 2023-04-30: qty 2

## 2023-04-30 MED ORDER — CELECOXIB 200 MG PO CAPS
ORAL_CAPSULE | ORAL | Status: AC
  Filled 2023-04-30: qty 2

## 2023-04-30 MED ORDER — POVIDONE-IODINE 10 % EX SWAB: 2.0000 | Freq: Once | CUTANEOUS | Status: DC

## 2023-04-30 MED ORDER — LIDOCAINE 20MG/ML (2%) 15 ML SYRINGE OPTIME
INTRAMUSCULAR | Status: DC | PRN
  Administered 2023-04-30: 1.5 mg/kg/h via INTRAVENOUS

## 2023-04-30 MED ORDER — IBUPROFEN 200 MG PO TABS
600.0000 mg | ORAL_TABLET | Freq: Four times a day (QID) | ORAL | Status: DC
  Administered 2023-04-30: 600 mg via ORAL

## 2023-04-30 MED ORDER — PROPOFOL 10 MG/ML IV BOLUS
INTRAVENOUS | Status: DC | PRN
  Administered 2023-04-30: 150 mg via INTRAVENOUS

## 2023-04-30 MED ORDER — ACETAMINOPHEN 325 MG PO TABS: 325.0000 mg | ORAL_TABLET | ORAL | Status: DC | PRN

## 2023-04-30 MED ORDER — ONDANSETRON HCL 4 MG PO TABS: 4.0000 mg | ORAL_TABLET | Freq: Four times a day (QID) | ORAL | Status: DC | PRN

## 2023-04-30 MED ORDER — SIMETHICONE 80 MG PO CHEW: 80.0000 mg | CHEWABLE_TABLET | Freq: Four times a day (QID) | ORAL | Status: DC | PRN

## 2023-04-30 MED ORDER — CELECOXIB 200 MG PO CAPS
400.0000 mg | ORAL_CAPSULE | ORAL | Status: AC
  Administered 2023-04-30: 400 mg via ORAL

## 2023-04-30 MED ORDER — ACETAMINOPHEN 325 MG PO TABS
650.0000 mg | ORAL_TABLET | Freq: Four times a day (QID) | ORAL | Status: DC
  Administered 2023-04-30: 650 mg via ORAL

## 2023-04-30 MED ORDER — STERILE WATER FOR IRRIGATION IR SOLN
Status: DC | PRN
  Administered 2023-04-30: 500 mL

## 2023-04-30 MED ORDER — LACTATED RINGERS IV SOLN: INTRAVENOUS | Status: DC

## 2023-04-30 MED ORDER — ONDANSETRON HCL 4 MG/2ML IJ SOLN
INTRAMUSCULAR | Status: DC | PRN
  Administered 2023-04-30: 4 mg via INTRAVENOUS

## 2023-04-30 MED ORDER — OXYCODONE HCL 5 MG PO TABS
5.0000 mg | ORAL_TABLET | Freq: Once | ORAL | Status: AC | PRN
  Administered 2023-04-30: 5 mg via ORAL

## 2023-04-30 MED ORDER — ROCURONIUM BROMIDE 10 MG/ML (PF) SYRINGE
PREFILLED_SYRINGE | INTRAVENOUS | Status: AC
  Filled 2023-04-30: qty 10

## 2023-04-30 MED ORDER — DEXAMETHASONE SODIUM PHOSPHATE 10 MG/ML IJ SOLN
INTRAMUSCULAR | Status: DC | PRN
  Administered 2023-04-30: 10 mg via INTRAVENOUS

## 2023-04-30 MED ORDER — SCOPOLAMINE 1 MG/3DAYS TD PT72
MEDICATED_PATCH | TRANSDERMAL | Status: AC
  Filled 2023-04-30: qty 1

## 2023-04-30 MED ORDER — OXYCODONE HCL 5 MG/5ML PO SOLN: 5.0000 mg | Freq: Once | ORAL | Status: AC | PRN

## 2023-04-30 MED ORDER — ROCURONIUM BROMIDE 10 MG/ML (PF) SYRINGE
PREFILLED_SYRINGE | INTRAVENOUS | Status: DC | PRN
  Administered 2023-04-30: 70 mg via INTRAVENOUS

## 2023-04-30 MED ORDER — KETAMINE HCL 10 MG/ML IJ SOLN
INTRAMUSCULAR | Status: DC | PRN
  Administered 2023-04-30: 5 mg via INTRAVENOUS
  Administered 2023-04-30: 10 mg via INTRAVENOUS
  Administered 2023-04-30: 5 mg via INTRAVENOUS
  Administered 2023-04-30: 20 mg via INTRAVENOUS
  Administered 2023-04-30: 10 mg via INTRAVENOUS

## 2023-04-30 MED ORDER — ENSURE PRE-SURGERY PO LIQD: 296.0000 mL | Freq: Once | ORAL | Status: DC

## 2023-04-30 MED ORDER — PROPOFOL 10 MG/ML IV BOLUS
INTRAVENOUS | Status: AC
  Filled 2023-04-30: qty 20

## 2023-04-30 MED ORDER — AMISULPRIDE (ANTIEMETIC) 5 MG/2ML IV SOLN: 10.0000 mg | Freq: Once | INTRAVENOUS | Status: DC | PRN

## 2023-04-30 MED ORDER — LIDOCAINE HCL (PF) 2 % IJ SOLN
INTRAMUSCULAR | Status: AC
  Filled 2023-04-30: qty 5

## 2023-04-30 MED ORDER — ONDANSETRON HCL 4 MG/2ML IJ SOLN: 4.0000 mg | Freq: Four times a day (QID) | INTRAMUSCULAR | Status: DC | PRN

## 2023-04-30 MED ORDER — ENSURE PRE-SURGERY PO LIQD: 592.0000 mL | Freq: Once | ORAL | Status: DC

## 2023-04-30 MED ORDER — IBUPROFEN 200 MG PO TABS
ORAL_TABLET | ORAL | Status: AC
  Filled 2023-04-30: qty 3

## 2023-04-30 MED ORDER — LIDOCAINE HCL 2 % IJ SOLN
INTRAMUSCULAR | Status: AC
  Filled 2023-04-30: qty 20

## 2023-04-30 MED ORDER — ACETAMINOPHEN 500 MG PO TABS
ORAL_TABLET | ORAL | Status: AC
  Filled 2023-04-30: qty 2

## 2023-04-30 MED ORDER — OXYCODONE HCL 5 MG PO TABS
5.0000 mg | ORAL_TABLET | ORAL | Status: DC | PRN
  Administered 2023-04-30 (×2): 5 mg via ORAL

## 2023-04-30 MED ORDER — PROMETHAZINE HCL 25 MG/ML IJ SOLN: 6.2500 mg | INTRAMUSCULAR | Status: DC | PRN

## 2023-04-30 MED ORDER — GABAPENTIN 300 MG PO CAPS
300.0000 mg | ORAL_CAPSULE | ORAL | Status: AC
  Administered 2023-04-30: 300 mg via ORAL

## 2023-04-30 MED ORDER — MIDAZOLAM HCL 5 MG/5ML IJ SOLN
INTRAMUSCULAR | Status: DC | PRN
  Administered 2023-04-30: 2 mg via INTRAVENOUS

## 2023-04-30 MED ORDER — GABAPENTIN 300 MG PO CAPS
ORAL_CAPSULE | ORAL | Status: AC
  Filled 2023-04-30: qty 1

## 2023-04-30 MED ORDER — LIDOCAINE 2% (20 MG/ML) 5 ML SYRINGE
INTRAMUSCULAR | Status: DC | PRN
  Administered 2023-04-30: 60 mg via INTRAVENOUS

## 2023-04-30 MED ORDER — DOCUSATE SODIUM 100 MG PO CAPS
100.0000 mg | ORAL_CAPSULE | Freq: Two times a day (BID) | ORAL | Status: DC
  Administered 2023-04-30: 100 mg via ORAL

## 2023-04-30 MED ORDER — CEFAZOLIN SODIUM-DEXTROSE 2-4 GM/100ML-% IV SOLN
INTRAVENOUS | Status: AC
  Filled 2023-04-30: qty 100

## 2023-04-30 MED ORDER — OXYCODONE HCL 5 MG PO TABS: ORAL_TABLET | ORAL | 0 refills | Status: DC

## 2023-04-30 MED ORDER — ACETAMINOPHEN 10 MG/ML IV SOLN: 1000.0000 mg | Freq: Once | INTRAVENOUS | Status: DC | PRN

## 2023-04-30 MED ORDER — MIDAZOLAM HCL 2 MG/2ML IJ SOLN
INTRAMUSCULAR | Status: AC
  Filled 2023-04-30: qty 2

## 2023-04-30 MED ORDER — DEXAMETHASONE SODIUM PHOSPHATE 10 MG/ML IJ SOLN
INTRAMUSCULAR | Status: AC
  Filled 2023-04-30: qty 1

## 2023-04-30 MED ORDER — CEFAZOLIN SODIUM-DEXTROSE 2-4 GM/100ML-% IV SOLN
2.0000 g | INTRAVENOUS | Status: AC
  Administered 2023-04-30: 2 g via INTRAVENOUS

## 2023-04-30 MED ORDER — ONDANSETRON HCL 4 MG/2ML IJ SOLN
INTRAMUSCULAR | Status: AC
  Filled 2023-04-30: qty 2

## 2023-04-30 MED ORDER — FENTANYL CITRATE (PF) 100 MCG/2ML IJ SOLN
25.0000 ug | INTRAMUSCULAR | Status: DC | PRN
  Administered 2023-04-30 (×2): 25 ug via INTRAVENOUS

## 2023-04-30 SURGICAL SUPPLY — 85 items
ADH SKN CLS APL DERMABOND .7 (GAUZE/BANDAGES/DRESSINGS) ×2
BAG DECANTER FOR FLEXI CONT (MISCELLANEOUS) IMPLANT
BAG DRN RND TRDRP ANRFLXCHMBR (UROLOGICAL SUPPLIES) ×2
BAG URINE DRAIN 2000ML AR STRL (UROLOGICAL SUPPLIES) ×2 IMPLANT
BARRIER ADHS 3X4 INTERCEED (GAUZE/BANDAGES/DRESSINGS) ×2 IMPLANT
BLADE SURG 10 STRL SS (BLADE) IMPLANT
BRR ADH 4X3 ABS CNTRL BYND (GAUZE/BANDAGES/DRESSINGS)
CATH FOLEY 3WAY  5CC 16FR (CATHETERS) ×2
CATH FOLEY 3WAY 5CC 16FR (CATHETERS) ×2 IMPLANT
COVER BACK TABLE 60X90IN (DRAPES) ×2 IMPLANT
COVER TIP SHEARS 8 DVNC (MISCELLANEOUS) ×2 IMPLANT
DEFOGGER SCOPE WARMER CLEARIFY (MISCELLANEOUS) ×2 IMPLANT
DERMABOND ADVANCED .7 DNX12 (GAUZE/BANDAGES/DRESSINGS) ×2 IMPLANT
DILATOR CANAL MILEX (MISCELLANEOUS) IMPLANT
DRAPE ARM DVNC X/XI (DISPOSABLE) ×8 IMPLANT
DRAPE COLUMN DVNC XI (DISPOSABLE) ×2 IMPLANT
DRAPE STERI URO 9X17 APER PCH (DRAPES) IMPLANT
DRAPE SURG IRRIG POUCH 19X23 (DRAPES) ×2 IMPLANT
DRAPE UTILITY XL STRL (DRAPES) ×2 IMPLANT
DRIVER NDL MEGA SUTCUT DVNCXI (INSTRUMENTS) ×2 IMPLANT
DRIVER NDLE MEGA SUTCUT DVNCXI (INSTRUMENTS) IMPLANT
DURAPREP 26ML APPLICATOR (WOUND CARE) ×2 IMPLANT
ELECT REM PT RETURN 9FT ADLT (ELECTROSURGICAL) ×4
ELECTRODE REM PT RTRN 9FT ADLT (ELECTROSURGICAL) ×2 IMPLANT
FORCEPS BPLR LNG DVNC XI (INSTRUMENTS) ×2 IMPLANT
FORCEPS LONG TIP 8 DVNC XI (FORCEP) ×2 IMPLANT
FORCEPS MICRO DMD BLK DVNC XI (FORCEP) IMPLANT
FORCEPS PROGRASP DVNC XI (FORCEP) ×2 IMPLANT
GAUZE 4X4 16PLY ~~LOC~~+RFID DBL (SPONGE) IMPLANT
GAUZE PETROLATUM 1 X8 (GAUZE/BANDAGES/DRESSINGS) ×2 IMPLANT
GLOVE BIO SURGEON STRL SZ7 (GLOVE) ×2 IMPLANT
GLOVE BIOGEL PI IND STRL 7.0 (GLOVE) ×6 IMPLANT
GLOVE ECLIPSE 6.5 STRL STRAW (GLOVE) ×6 IMPLANT
HOLDER FOLEY CATH W/STRAP (MISCELLANEOUS) IMPLANT
IRRIG SUCT STRYKERFLOW 2 WTIP (MISCELLANEOUS)
IRRIGATION SUCT STRKRFLW 2 WTP (MISCELLANEOUS) ×2 IMPLANT
IV NS 1000ML (IV SOLUTION) ×2
IV NS 1000ML BAXH (IV SOLUTION) IMPLANT
KIT PINK PAD W/HEAD ARE REST (MISCELLANEOUS) ×2
KIT PINK PAD W/HEAD ARM REST (MISCELLANEOUS) ×2 IMPLANT
KIT TURNOVER CYSTO (KITS) ×2 IMPLANT
LEGGING LITHOTOMY PAIR STRL (DRAPES) ×2 IMPLANT
NDL HYPO 18GX1.5 BLUNT FILL (NEEDLE) IMPLANT
NDL HYPO 22X1.5 SAFETY MO (MISCELLANEOUS) ×2 IMPLANT
NDL SPNL 22GX3.5 QUINCKE BK (NEEDLE) IMPLANT
NEEDLE HYPO 18GX1.5 BLUNT FILL (NEEDLE) ×2 IMPLANT
NEEDLE HYPO 22X1.5 SAFETY MO (MISCELLANEOUS) ×2 IMPLANT
NEEDLE SPNL 22GX3.5 QUINCKE BK (NEEDLE) ×2 IMPLANT
OBTURATOR OPTICAL STND 8 DVNC (TROCAR)
OBTURATOR OPTICALSTD 8 DVNC (TROCAR) ×2 IMPLANT
OCCLUDER COLPOPNEUMO (BALLOONS) ×2 IMPLANT
PACK ROBOT WH (CUSTOM PROCEDURE TRAY) ×2 IMPLANT
PACK ROBOTIC GOWN (GOWN DISPOSABLE) ×2 IMPLANT
PAD OB MATERNITY 4.3X12.25 (PERSONAL CARE ITEMS) ×2 IMPLANT
PAD PREP 24X48 CUFFED NSTRL (MISCELLANEOUS) ×2 IMPLANT
POUCH LAPAROSCOPIC INSTRUMENT (MISCELLANEOUS) ×2 IMPLANT
PROTECTOR NERVE ULNAR (MISCELLANEOUS) ×6 IMPLANT
SCISSORS MNPLR CVD DVNC XI (INSTRUMENTS) ×2 IMPLANT
SEAL UNIV 5-12 XI (MISCELLANEOUS) ×8 IMPLANT
SEALER VESSEL EXT DVNC XI (MISCELLANEOUS) IMPLANT
SET IRRIG Y TYPE TUR BLADDER L (SET/KITS/TRAYS/PACK) IMPLANT
SET TRI-LUMEN FLTR TB AIRSEAL (TUBING) ×2 IMPLANT
SLEEVE SCD COMPRESS KNEE MED (STOCKING) ×2 IMPLANT
SPIKE FLUID TRANSFER (MISCELLANEOUS) ×4 IMPLANT
STRIP CLOSURE SKIN 1/4X4 (GAUZE/BANDAGES/DRESSINGS) ×2 IMPLANT
SUT MNCRL AB 3-0 PS2 27 (SUTURE) ×4 IMPLANT
SUT VIC AB 0 CT1 18XCR BRD8 (SUTURE) IMPLANT
SUT VIC AB 0 CT1 27 (SUTURE) ×4
SUT VIC AB 0 CT1 27XBRD ANBCTR (SUTURE) ×4 IMPLANT
SUT VIC AB 0 CT1 8-18 (SUTURE) ×6
SUT VIC AB 2-0 CTB1 (SUTURE) IMPLANT
SUT VICRYL 0 TIES 12 18 (SUTURE) IMPLANT
SUT VICRYL 0 UR6 27IN ABS (SUTURE) ×2 IMPLANT
SUT VLOC 180 0 9IN  GS21 (SUTURE)
SUT VLOC 180 0 9IN GS21 (SUTURE) ×4 IMPLANT
SYR TB 1ML LL NO SAFETY (SYRINGE) IMPLANT
TIP RUMI ORANGE 6.7MMX12CM (TIP) IMPLANT
TIP UTERINE 5.1X6CM LAV DISP (MISCELLANEOUS) IMPLANT
TIP UTERINE 6.7X10CM GRN DISP (MISCELLANEOUS) IMPLANT
TIP UTERINE 6.7X6CM WHT DISP (MISCELLANEOUS) IMPLANT
TIP UTERINE 6.7X8CM BLUE DISP (MISCELLANEOUS) IMPLANT
TOWEL OR 17X24 6PK STRL BLUE (TOWEL DISPOSABLE) ×2 IMPLANT
TROCAR PORT AIRSEAL 8X120 (TROCAR) ×2 IMPLANT
WATER STERILE IRR 1000ML POUR (IV SOLUTION) ×2 IMPLANT
WATER STERILE IRR 500ML POUR (IV SOLUTION) IMPLANT

## 2023-04-30 NOTE — Interval H&P Note (Signed)
History and Physical Interval Note:  04/30/2023 9:22 AM  Shelley Morrison  has presented today for surgery, with the diagnosis of UTERINE FIBROIDS.  The various methods of treatment have been discussed with the patient and family. After consideration of risks, benefits and other options for treatment, the patient has consented to  Procedure(s): XI ROBOTIC ASSISTED LAPAROSCOPIC HYSTERECTOMY AND SALPINGECTOMY (Bilateral) as a surgical intervention.  The patient's history has been reviewed, patient examined, no change in status, stable for surgery.  I have reviewed the patient's chart and labs.  Questions were answered to the patient's satisfaction.     Dois Davenport A Adaleena Mooers

## 2023-04-30 NOTE — Discharge Instructions (Signed)
Call [facility] at [phone] if:   You have a temperature greater than or equal to 100.4 degrees Farenheit orally You have pain that is not made better by the pain medication given and taken as directed You have excessive bleeding or problems urinating  Take Colace (Docusate Sodium/Stool Softener) 100 mg 2-3 times daily while taking narcotic pain medicine to avoid constipation or until bowel movements are regular.  Take Ibuprofen 600 mg with food along with Acetaminophen 500 mg  take #2 tablets  every 6 hours for 5 days then as needed for pain  You may drive after 2 weeks You may walk up steps  You may shower tomorrow You may resume a regular diet   Do not lift over 15 pounds for 6 weeks Avoid anything in vagina for 6 weeks

## 2023-04-30 NOTE — Anesthesia Preprocedure Evaluation (Addendum)
Anesthesia Evaluation  Patient identified by MRN, date of birth, ID band Patient awake    Reviewed: Allergy & Precautions, NPO status , Patient's Chart, lab work & pertinent test results  Airway Mallampati: II  TM Distance: >3 FB Neck ROM: Full    Dental  (+) Edentulous Upper, Edentulous Lower   Pulmonary former smoker   breath sounds clear to auscultation       Cardiovascular hypertension, Pt. on home beta blockers  Rhythm:Regular Rate:Normal     Neuro/Psych negative neurological ROS  negative psych ROS   GI/Hepatic negative GI ROS, Neg liver ROS,,,  Endo/Other  negative endocrine ROS    Renal/GU negative Renal ROS     Musculoskeletal  (+) Arthritis , Rheumatoid disorders,    Abdominal   Peds  Hematology negative hematology ROS (+)   Anesthesia Other Findings   Reproductive/Obstetrics                             Anesthesia Physical Anesthesia Plan  ASA: 2  Anesthesia Plan: General   Post-op Pain Management: Tylenol PO (pre-op)*, Gabapentin PO (pre-op)* and Celebrex PO (pre-op)*   Induction: Intravenous  PONV Risk Score and Plan: 4 or greater and Ondansetron, Dexamethasone, Midazolam and Scopolamine patch - Pre-op  Airway Management Planned: Oral ETT  Additional Equipment: None  Intra-op Plan:   Post-operative Plan: Extubation in OR  Informed Consent: I have reviewed the patients History and Physical, chart, labs and discussed the procedure including the risks, benefits and alternatives for the proposed anesthesia with the patient or authorized representative who has indicated his/her understanding and acceptance.       Plan Discussed with: CRNA  Anesthesia Plan Comments:        Anesthesia Quick Evaluation

## 2023-04-30 NOTE — Transfer of Care (Signed)
Immediate Anesthesia Transfer of Care Note  Patient: Shelley Morrison  Procedure(s) Performed: ABORTED XI ROBOTIC ASSISTED LAPAROSCOPIC HYSTERECTOMY AND SALPINGECTOMY (Bilateral) HYSTERECTOMY VAGINAL WITH MYOMECTOMY AND  BILATERAL SALPINGECTOMY (Vagina )  Patient Location: PACU  Anesthesia Type:General  Level of Consciousness: awake, alert , and oriented  Airway & Oxygen Therapy: Patient Spontanous Breathing and Patient connected to nasal cannula oxygen  Post-op Assessment: Report given to RN and Post -op Vital signs reviewed and stable  Post vital signs: Reviewed and stable  Last Vitals:  Vitals Value Taken Time  BP 135/75 04/30/23 1215  Temp 36.7 C 04/30/23 1214  Pulse 62 04/30/23 1218  Resp 14 04/30/23 1218  SpO2 100 % 04/30/23 1218  Vitals shown include unvalidated device data.  Last Pain:  Vitals:   04/30/23 1214  TempSrc:   PainSc: Asleep      Patients Stated Pain Goal: 3 (04/30/23 0747)  Complications: No notable events documented.

## 2023-04-30 NOTE — Anesthesia Procedure Notes (Signed)
Procedure Name: Intubation Date/Time: 04/30/2023 9:39 AM  Performed by: Tameshia Bonneville D, CRNAPre-anesthesia Checklist: Patient identified, Emergency Drugs available, Suction available and Patient being monitored Patient Re-evaluated:Patient Re-evaluated prior to induction Oxygen Delivery Method: Circle system utilized Preoxygenation: Pre-oxygenation with 100% oxygen Induction Type: IV induction Ventilation: Mask ventilation without difficulty Laryngoscope Size: Mac and 3 Grade View: Grade I Tube type: Oral Tube size: 7.0 mm Number of attempts: 1 Airway Equipment and Method: Stylet and Oral airway Placement Confirmation: ETT inserted through vocal cords under direct vision, positive ETCO2 and breath sounds checked- equal and bilateral Secured at: 21 cm Tube secured with: Tape Dental Injury: Teeth and Oropharynx as per pre-operative assessment

## 2023-04-30 NOTE — Op Note (Signed)
Preoperative diagnosis:uterine fibroids  Postoperative diagnosis: Same   Anesthesia: General  Anesthesiologist: Dr Hart Rochester  Procedure: Total vaginal hysterectomy with bilateral salpingectomy  Surgeon: Dr. Dois Davenport Theoren Palka   Assistant: Henreitta Leber P.A.-C.   Estimated blood loss: 200 cc   Procedure:   After being informed of the planned procedure with possible complications including but not limited to infection, bleeding, injury to other organs, need for laparotomy, expected hospitals they and recovery time, informed consent was obtained and patient was taken to or #5.   She was given general anesthesia with endotracheal intubation without any complication. She was placed in the lithotomy position prepped and draped in a sterile fashion and a Foley catheter was inserted in her bladder.   Pelvic exam reveal a 10-week size uterus, irregular but surprisingly mobile with a descensus 3/4. Both adnexa palpate normal. Decision was made to proceed with vaginal approach instead of the planned robotic approach. The family was informed and the room instrumentation modified for total vaginal hysterectomy.  A weighted speculum is inserted in the vagina and the uterus was grasped with 1 Christella Hartigan forcep. We infiltrate the mucosa around the cervix using Vasopressine 20 Units/100 ml of Saline. We perform a circular incision around the cervix and then proceed with blunt and sharp dissection of the anterior and posterior vaginal mucosa. This identifies the posterior cul-de-sac which was sharply opened. The bladder was retracted upward . Both uterosacral ligaments are isolated on Masterson forcep, sectioned, sutured with a transfixed suture of 0 Vicryl maintained for future suspension. The anterior cul-de-sac is then dissected up and the peritoneum is opened sharply. This allows for easy isolation of the vascular pedicle on each side using Masterson forceps. The vascular pedicle is then sectioned and sutured with a  transfixed suture of 0 Vicryl. We can then isolate part of the broad ligament on each side using Masterson forceps. These pedicles are sectioned and sutured with a transfixed suture of 0 Vicryl. We now have easy access to the final pedicle incorporating the round ligament, the utero-ovarian ligament and tube. This last pedicle is then clamped with Rogers forcep, sectioned and doubly sutured with a transfixed suture of 0 Vicryl and a free tie of 0 Vicryl. The uterus is removed. We now identify a left sided broad ligament fibroid measuring 6 cm. It is grabbed with tenaculum and its serosa/broad ligament is infiltrated with vasopressine 20 units/100 cc of saline. The fibroid is then bluntly enucleated until freed and removed. Multiple figure-of eight sutures are required to maintain hemostasis of the broad ligament.    Both ovaries are visualized and normal. We have access to both tubes so we proceed with bilateral salpingectomy by isolating the mesosalpinx on a heaney forceps bilaterally. Each tube is then sharply removed and the pedicle is sutured with a transfix suture of 0 Vicryl and secured with a free tie of 0 Vicryl.   We then systematically verified hemostasis on all pedicles. Hemostasis is completed with figure-of-eight sutures. Hemostasis is then confirmed adequate. We proceed with closure of the posterior cul-de-sac by placing a suture reuniting the 2 uterosacral ligaments as well as the posterior cul-de-sac with 0 Vicryl. We then placed 2 vaginal suspension sutures using 0 Vicryl which will you reunite the posterior vaginal mucosa, the posterior peritoneum, the corresponding uterosacral ligaments and the anterior peritoneum. The sutures are then tied which closes the peritoneum at the same time.   The vaginal cuff is closed transversally with figure-of-eight sutures of 0-Vicryl.  Instruments and sponge count  is complete x2.   The procedure is well tolerated by the patient who is taken to recovery  room in a well and stable condition.  Dr Estanislado Pandy was present and scrubbed at all times. Surgical assistance was required due to the complexity of the anatomy and the vaginal approach of the procedure.    Estimated blood loss is 200 cc   Specimen: Uterus, 2 tubes and fibroid sent to pathology weighing 208 g.

## 2023-05-01 ENCOUNTER — Encounter (HOSPITAL_BASED_OUTPATIENT_CLINIC_OR_DEPARTMENT_OTHER): Payer: Self-pay | Admitting: Obstetrics and Gynecology

## 2023-05-01 NOTE — Anesthesia Postprocedure Evaluation (Signed)
Anesthesia Post Note  Patient: Shelley Morrison  Procedure(s) Performed: ABORTED XI ROBOTIC ASSISTED LAPAROSCOPIC HYSTERECTOMY AND SALPINGECTOMY (Bilateral) HYSTERECTOMY VAGINAL WITH MYOMECTOMY AND  BILATERAL SALPINGECTOMY (Vagina )     Patient location during evaluation: PACU Anesthesia Type: General Level of consciousness: awake and alert Pain management: pain level controlled Vital Signs Assessment: post-procedure vital signs reviewed and stable Respiratory status: spontaneous breathing, nonlabored ventilation, respiratory function stable and patient connected to nasal cannula oxygen Cardiovascular status: blood pressure returned to baseline and stable Postop Assessment: no apparent nausea or vomiting Anesthetic complications: no   No notable events documented.              Shelton Silvas

## 2023-05-04 LAB — SURGICAL PATHOLOGY

## 2023-05-28 NOTE — Progress Notes (Unsigned)
Office Visit Note  Patient: Shelley Morrison             Date of Birth: 17-Jul-1966           MRN: [id number]             PCP: Gilda Crease, MD Referring: Gilda Crease, MD Visit Date: 06/09/2023 Occupation: @GUAROCC @  Subjective:  Medication monitoring   History of Present Illness: Shelley Morrison is a 57 y.o. female with history of seropositive rheumatoid arthritis and osteoarthritis.  Patient underwent a laparoscopic hysterectomy on 04/30/2023 performed by Dr. Estanislado Pandy.  Patient states that she has been holding sulfasalazine for the past 6 weeks but has a postop visit tomorrow for clearance to resume.  Patient states over the past few days she has started to notice increased stiffness in both hands but denies any pain or tenderness at this time. She denies any recent or recurrent infections.   Activities of Daily Living:  Patient reports morning stiffness for 0 minutes.   Patient Denies nocturnal pain.  Difficulty dressing/grooming: Denies Difficulty climbing stairs: Denies Difficulty getting out of chair: Denies Difficulty using hands for taps, buttons, cutlery, and/or writing: Denies  Review of Systems  Constitutional:  Positive for fatigue.  HENT:  Negative for mouth sores and mouth dryness.   Eyes:  Negative for dryness.  Respiratory:  Negative for shortness of breath.   Cardiovascular:  Negative for chest pain and palpitations.  Gastrointestinal:  Negative for blood in stool, constipation and diarrhea.  Endocrine: Negative for increased urination.  Genitourinary:  Negative for involuntary urination.  Musculoskeletal:  Positive for joint pain, joint pain and joint swelling. Negative for gait problem, myalgias, muscle weakness, morning stiffness, muscle tenderness and myalgias.  Skin:  Negative for color change, rash, hair loss and sensitivity to sunlight.  Allergic/Immunologic: Negative for susceptible to infections.  Neurological:  Negative for dizziness and headaches.   Hematological:  Negative for swollen glands.  Psychiatric/Behavioral:  Positive for sleep disturbance. Negative for depressed mood. The patient is not nervous/anxious.     PMFS History:  Patient Active Problem List   Diagnosis Date Noted   Uterine fibroid 04/30/2023   Rheumatoid arthritis with rheumatoid factor of multiple sites without organ or systems involvement (HCC) 06/19/2017   High risk medication use 06/18/2017   Vitamin D deficiency 06/18/2017   Primary insomnia 05/19/2017   Essential hypertension 05/19/2017   ANA positive 05/08/2017   Rheumatoid factor positive 05/08/2017    Past Medical History:  Diagnosis Date   Elevated LFTs    Follows w/ [facility].   Fatty liver    Follows with [facility].   History of adenomatous polyp of colon    Hypertension    Follows w/ [facility].   Lupus (HCC) 2018   Follows with [facility].   Rheumatoid arthritis (HCC)    seropositive rheumatoid arthritis, follows with [facility]   Wears contact lenses    Wears dentures    full set   Wears glasses     Family History  Problem Relation Age of Onset   Healthy Son    Healthy Daughter    Past Surgical History:  Procedure Laterality Date   COLONOSCOPY  12/21/2022   with [facility]   LAPAROSCOPIC CHOLECYSTECTOMY  1997   ROBOTIC ASSISTED LAPAROSCOPIC HYSTERECTOMY AND SALPINGECTOMY Bilateral 04/30/2023   Procedure: ABORTED XI ROBOTIC ASSISTED LAPAROSCOPIC HYSTERECTOMY AND SALPINGECTOMY;  Surgeon: Silverio Lay, MD;  Location: [facility];  Service: Gynecology;  Laterality: Bilateral;   VAGINAL HYSTERECTOMY N/A 04/30/2023   Procedure: HYSTERECTOMY VAGINAL WITH MYOMECTOMY AND  BILATERAL SALPINGECTOMY;  Surgeon: Silverio Lay, MD;  Location: [facility];  Service: Gynecology;  Laterality: N/A;   Social History   Social History Narrative   Not on file   Immunization History  Administered Date(s) Administered   PFIZER(Purple  Top)SARS-COV-2 Vaccination 09/24/2020, 10/15/2020     Objective: Vital Signs: BP (!) 147/83 (BP Location: Left Arm, Patient Position: Sitting, Cuff Size: Normal)   Pulse 81   Resp 15   Ht 5\' 10"  (1.778 m)   Wt 200 lb 9.6 oz (91 kg)   LMP 09/14/2017   BMI 28.78 kg/m    Physical Exam Vitals and nursing note reviewed.  Constitutional:      Appearance: She is well-developed.  HENT:     Head: Normocephalic and atraumatic.  Eyes:     Conjunctiva/sclera: Conjunctivae normal.  Cardiovascular:     Rate and Rhythm: Normal rate and regular rhythm.     Heart sounds: Normal heart sounds.  Pulmonary:     Effort: Pulmonary effort is normal.     Breath sounds: Normal breath sounds.  Abdominal:     General: Bowel sounds are normal.     Palpations: Abdomen is soft.  Musculoskeletal:     Cervical back: Normal range of motion.  Lymphadenopathy:     Cervical: No cervical adenopathy.  Skin:    General: Skin is warm and dry.     Capillary Refill: Capillary refill takes less than 2 seconds.  Neurological:     Mental Status: She is alert and oriented to person, place, and time.  Psychiatric:        Behavior: Behavior normal.      Musculoskeletal Exam: C-spine, thoracic spine, lumbar spine have good range of motion.  Shoulder joints, elbow joints, wrist joints, MCPs, PIPs, DIPs have good range of motion with no synovitis.  Complete fist formation bilaterally.  Hip joints have good range of motion with no groin pain.  Knee joints have good range of motion with no warmth or effusion.  Ankle joints have good range of motion with no tenderness or joint swelling.  CDAI Exam: CDAI Score: -- Patient Global: 2 mm; Provider Global: 2 mm Swollen: --; Tender: -- Joint Exam 06/09/2023   No joint exam has been documented for this visit   There is currently no information documented on the homunculus. Go to the Rheumatology activity and complete the homunculus joint exam.  Investigation: No  additional findings.  Imaging: No results found.  Recent Labs: Lab Results  Component Value Date   WBC 4.9 04/27/2023   HGB 13.0 04/27/2023   PLT 222 04/27/2023   NA 136 04/27/2023   K 4.0 04/27/2023   CL 104 04/27/2023   CO2 27 04/27/2023   GLUCOSE 118 (H) 04/27/2023   BUN 8 04/27/2023   CREATININE 0.86 04/27/2023   BILITOT 0.5 04/27/2023   ALKPHOS 64 04/27/2023   AST 53 (H) 04/27/2023   ALT 82 (H) 04/27/2023   PROT 7.6 04/27/2023   ALBUMIN 3.6 04/27/2023   CALCIUM 9.0 04/27/2023   GFRAA 82 07/03/2021   QFTBGOLDPLUS NEGATIVE 08/16/2021    Speciality Comments: PLQ Eye Exam:11/25/2021 WNL @ [facility] Follow up in 6 months, methotrexate-fatigue nausea and brain fog Arava-hair loss, inadequate response  Procedures:  No procedures performed Allergies: Acetaminophen-codeine   Assessment / Plan:     Visit Diagnoses: Seropositive  rheumatoid arthritis of multiple sites [facility]): She has no synovitis on examination today.  Over the past few days she has had some increase stiffness in both hands to the point that she is aware she has some inflammation but has not noticed any pain or joint tenderness.  She has been off of sulfasalazine for the past 6 weeks due to undergoing laparoscopic hysterectomy on 04/30/2023.  She is scheduled for a postop follow-up visit tomorrow at which time she is hoping to have clearance to resume sulfasalazine as prescribed.  She previously was tolerating sulfasalazine without any side effects and was taking 500 mg 1 tablet twice daily.  She does not currently need a refill but will notify us when she does.  She will remain on sulfasalazine as monotherapy.  She was advised to notify us if she develops signs or symptoms of a flare.  She will follow-up in the office in 3 months or sooner if needed.  High risk medication use - Sulfasalazine 500 mg 1 tablet by mouth twice daily. Previous therapy: Enbrel initiated on 08/29/21-discontinued in July  2023-recurrent infections. CBC and CMP updated on 04/27/23.  Plan on updating CBC and CMP today and then every 3 months to monitor for drug toxicity. No recent or recurrent infections.  Discussed the importance of holding sulfasalazine if she develops signs or symptoms of an infection and to resume once the infection has completely cleared.   - Plan: COMPLETE METABOLIC PANEL WITH GFR, CBC with Differential/Platelet  Elevated LFTs -Evaluated by [facility] for elevated LFTs on 03/16/23.  Felt to be due to fatty liver disease recommended low-fat/low-cholesterol diet, regular exercise, and weight loss.   Cmp updated today. Plan: COMPLETE METABOLIC PANEL WITH GFR  Primary osteoarthritis of both hands: She is experiencing some increase stiffness in both hands but has no active synovitis or joint tenderness on examination today.  Discussed the importance of joint protection and muscle strengthening.  Primary osteoarthritis of left hip: She has good range of motion of the left hip with no discomfort at this time.  Primary osteoarthritis of both feet: She is not experiencing any increased discomfort in her feet at this time.  History of vitamin D deficiency: She is taking vitamin D 1000 units daily.  Primary insomnia -She takes Trazodone 50 mg at bedtime as needed for insomnia.  History of hypertension: Blood pressure was elevated today in the office at 143/81 and was rechecked prior to leaving.  She has been monitoring her blood pressures on a daily basis at home and it has been well-controlled.  She plans on continue to monitor blood pressure closely and if remains elevated she will reach out to her PCP.  Orders: Orders Placed This Encounter  Procedures   COMPLETE METABOLIC PANEL WITH GFR   CBC with Differential/Platelet   No orders of the defined types were placed in this encounter.     Follow-Up Instructions: Return in about 3 months (around 09/09/2023) for Rheumatoid arthritis,  Osteoarthritis.   Gearldine Bienenstock, PA-C  Note - This record has been created using Dragon software.  Chart creation errors have been sought, but may not always  have been located. Such creation errors do not reflect on  the standard of medical care.

## 2023-06-09 ENCOUNTER — Ambulatory Visit: Payer: BC Managed Care – PPO | Attending: Physician Assistant | Admitting: Physician Assistant

## 2023-06-09 ENCOUNTER — Encounter: Payer: Self-pay | Admitting: Physician Assistant

## 2023-06-09 VITALS — BP 147/83 | HR 81 | Resp 15 | Ht 70.0 in | Wt 200.6 lb

## 2023-06-09 DIAGNOSIS — M19072 Primary osteoarthritis, left ankle and foot: Secondary | ICD-10-CM

## 2023-06-09 DIAGNOSIS — M19041 Primary osteoarthritis, right hand: Secondary | ICD-10-CM

## 2023-06-09 DIAGNOSIS — M19042 Primary osteoarthritis, left hand: Secondary | ICD-10-CM

## 2023-06-09 DIAGNOSIS — Z8679 Personal history of other diseases of the circulatory system: Secondary | ICD-10-CM

## 2023-06-09 DIAGNOSIS — R7989 Other specified abnormal findings of blood chemistry: Secondary | ICD-10-CM | POA: Diagnosis not present

## 2023-06-09 DIAGNOSIS — M1612 Unilateral primary osteoarthritis, left hip: Secondary | ICD-10-CM

## 2023-06-09 DIAGNOSIS — F5101 Primary insomnia: Secondary | ICD-10-CM

## 2023-06-09 DIAGNOSIS — M0579 Rheumatoid arthritis with rheumatoid factor of multiple sites without organ or systems involvement: Secondary | ICD-10-CM | POA: Diagnosis not present

## 2023-06-09 DIAGNOSIS — Z79899 Other long term (current) drug therapy: Secondary | ICD-10-CM | POA: Diagnosis not present

## 2023-06-09 DIAGNOSIS — M19071 Primary osteoarthritis, right ankle and foot: Secondary | ICD-10-CM

## 2023-06-09 DIAGNOSIS — Z8639 Personal history of other endocrine, nutritional and metabolic disease: Secondary | ICD-10-CM

## 2023-06-09 LAB — CBC WITH DIFFERENTIAL/PLATELET: Basophils Absolute: 43 cells/uL (ref 0–200)

## 2023-06-09 NOTE — Patient Instructions (Signed)
Standing Labs We placed an order today for your standing lab work.   Please have your standing labs drawn in September and every 3 months   Please have your labs drawn 2 weeks prior to your appointment so that the provider can discuss your lab results at your appointment, if possible.  Please note that you may see your imaging and lab results in MyChart before we have reviewed them. We will contact you once all results are reviewed. Please allow our office up to 72 hours to thoroughly review all of the results before contacting the office for clarification of your results.  WALK-IN LAB HOURS  Monday through Thursday from 8:00 am -12:30 pm and 1:00 pm-5:00 pm and Friday from 8:00 am-12:00 pm.  Patients with office visits requiring labs will be seen before walk-in labs.  You may encounter longer than normal wait times. Please allow additional time. Wait times may be shorter on  Monday and Thursday afternoons.  We do not book appointments for walk-in labs. We appreciate your patience and understanding with our staff.   Labs are drawn by [facility]. Please bring your co-pay at the time of your lab draw.  You may receive a bill from [facility] for your lab work.  Please note if you are on Hydroxychloroquine and and an order has been placed for a Hydroxychloroquine level,  you will need to have it drawn 4 hours or more after your last dose.  If you wish to have your labs drawn at another location, please call the office 24 hours in advance so we can fax the orders.  The office is located at [address], [address], [place], [place] [address]   If you have any questions regarding directions or hours of operation,  please call [phone].   As a reminder, please drink plenty of water prior to coming for your lab work. Thanks!  

## 2023-06-10 LAB — CBC WITH DIFFERENTIAL/PLATELET
Absolute Monocytes: 616 cells/uL (ref 200–950)
Basophils Relative: 0.7 %
Eosinophils Absolute: 189 cells/uL (ref 15–500)
Eosinophils Relative: 3.1 %
HCT: 36.8 % (ref 35.0–45.0)
Hemoglobin: 11.8 g/dL (ref 11.7–15.5)
Lymphs Abs: 3123 cells/uL (ref 850–3900)
MCH: 28.5 pg (ref 27.0–33.0)
MCHC: 32.1 g/dL (ref 32.0–36.0)
MCV: 88.9 fL (ref 80.0–100.0)
MPV: 10.5 fL (ref 7.5–12.5)
Monocytes Relative: 10.1 %
Neutro Abs: 2129 cells/uL (ref 1500–7800)
Neutrophils Relative %: 34.9 %
Platelets: 250 10*3/uL (ref 140–400)
RBC: 4.14 10*6/uL (ref 3.80–5.10)
RDW: 11.9 % (ref 11.0–15.0)
Total Lymphocyte: 51.2 %
WBC: 6.1 10*3/uL (ref 3.8–10.8)

## 2023-06-10 LAB — COMPLETE METABOLIC PANEL WITH GFR
AG Ratio: 1.3 (calc) (ref 1.0–2.5)
ALT: 63 U/L — ABNORMAL HIGH (ref 6–29)
AST: 41 U/L — ABNORMAL HIGH (ref 10–35)
Albumin: 4 g/dL (ref 3.6–5.1)
Alkaline phosphatase (APISO): 73 U/L (ref 37–153)
BUN/Creatinine Ratio: 8 (calc) (ref 6–22)
BUN: 6 mg/dL — ABNORMAL LOW (ref 7–25)
CO2: 28 mmol/L (ref 20–32)
Calcium: 9.7 mg/dL (ref 8.6–10.4)
Chloride: 103 mmol/L (ref 98–110)
Creat: 0.8 mg/dL (ref 0.50–1.03)
Globulin: 3.2 g/dL (calc) (ref 1.9–3.7)
Glucose, Bld: 100 mg/dL — ABNORMAL HIGH (ref 65–99)
Potassium: 4.1 mmol/L (ref 3.5–5.3)
Sodium: 138 mmol/L (ref 135–146)
Total Bilirubin: 0.3 mg/dL (ref 0.2–1.2)
Total Protein: 7.2 g/dL (ref 6.1–8.1)
eGFR: 86 mL/min/{1.73_m2} (ref >=60)

## 2023-06-10 NOTE — Progress Notes (Signed)
CBC WNL  LFTs remain elevated  but have improved.  Rest of CMP WNL.

## 2023-07-16 ENCOUNTER — Other Ambulatory Visit: Payer: Self-pay | Admitting: Physician Assistant

## 2023-07-20 ENCOUNTER — Other Ambulatory Visit: Payer: Self-pay | Admitting: Physician Assistant

## 2023-07-30 DIAGNOSIS — R945 Abnormal results of liver function studies: Secondary | ICD-10-CM | POA: Diagnosis not present

## 2023-07-30 DIAGNOSIS — Z0000 Encounter for general adult medical examination without abnormal findings: Secondary | ICD-10-CM | POA: Diagnosis not present

## 2023-07-30 DIAGNOSIS — E785 Hyperlipidemia, unspecified: Secondary | ICD-10-CM | POA: Diagnosis not present

## 2023-07-30 DIAGNOSIS — E559 Vitamin D deficiency, unspecified: Secondary | ICD-10-CM | POA: Diagnosis not present

## 2023-07-30 DIAGNOSIS — R7309 Other abnormal glucose: Secondary | ICD-10-CM | POA: Diagnosis not present

## 2023-07-30 DIAGNOSIS — R799 Abnormal finding of blood chemistry, unspecified: Secondary | ICD-10-CM | POA: Diagnosis not present

## 2023-07-31 ENCOUNTER — Other Ambulatory Visit: Payer: Self-pay | Admitting: Family Medicine

## 2023-07-31 DIAGNOSIS — E2839 Other primary ovarian failure: Secondary | ICD-10-CM

## 2023-08-18 DIAGNOSIS — M542 Cervicalgia: Secondary | ICD-10-CM | POA: Diagnosis not present

## 2023-08-18 DIAGNOSIS — L304 Erythema intertrigo: Secondary | ICD-10-CM | POA: Diagnosis not present

## 2023-08-18 DIAGNOSIS — N62 Hypertrophy of breast: Secondary | ICD-10-CM | POA: Diagnosis not present

## 2023-08-19 ENCOUNTER — Other Ambulatory Visit: Payer: Self-pay | Admitting: Physician Assistant

## 2023-08-19 MED ORDER — SULFASALAZINE 500 MG PO TABS: 500.0000 mg | ORAL_TABLET | Freq: Two times a day (BID) | ORAL | 0 refills | Status: DC

## 2023-08-19 NOTE — Telephone Encounter (Signed)
Patient contacted the office to request a medication refill.   1. Name of Medication: Sulfasalazine  2. How are you currently taking this medication (dosage and times per day)? 500mg    3. What pharmacy would you like for that to be sent to? [facility]

## 2023-08-19 NOTE — Telephone Encounter (Signed)
Last Fill: 10/25/2023  Labs: 06/09/2023 CBC WNL LFTs remain elevated  but have improved.  Rest of CMP WNL.  Next Visit: Due September 2024. Message sent to the front to schedule.   Last Visit: 06/09/2023  DX: Seropositive rheumatoid arthritis of multiple sites   Current Dose per office note 06/09/2023: Sulfasalazine 500 mg 1 tablet by mouth twice daily.   Okay to refill Sulfasalazine?

## 2023-08-19 NOTE — Telephone Encounter (Signed)
Please schedule patient a follow up visit. Patient due September 2024. Thanks!

## 2023-08-27 NOTE — Progress Notes (Unsigned)
Office Visit Note  Patient: Shelley Morrison             Date of Birth: 1966-06-18           MRN: [id number]             PCP: Gilda Crease, MD Referring: Gilda Crease, MD Visit Date: 09/10/2023 Occupation: @GUAROCC @  Subjective:  Flare   History of Present Illness: Shelley Morrison is a 57 y.o. female with history of seropositive rheumatoid arthritis and osteoarthritis.  Patient remains on Sulfasalazine 500 mg 1 tablet by mouth twice daily.  She is tolerating sulfasalazine without any side effects or interruptions.  Patient presents today experiencing a flare involving multiple joints.  According to the patient the flare started about 1 week ago.  Last week she had to miss 4 days of work due to the severity of her symptoms.  She is currently having pain and inflammation in both hands and both wrist joints.  She is also having warmth and swelling in both knee joints.  She denies any identifiable trigger for the flare.  She is been taking Tylenol as needed for pain relief. She denies any new medical conditions.  She denies any recent or recurrent infections.   Activities of Daily Living:  Patient reports joint stiffness all day  Patient Reports nocturnal pain.  Difficulty dressing/grooming: Denies Difficulty climbing stairs: Denies Difficulty getting out of chair: Reports Difficulty using hands for taps, buttons, cutlery, and/or writing: Reports  Review of Systems  Constitutional:  Positive for fatigue.  HENT:  Negative for mouth sores and mouth dryness.   Eyes:  Positive for dryness.  Respiratory:  Negative for shortness of breath.   Cardiovascular:  Negative for chest pain and palpitations.  Gastrointestinal:  Negative for blood in stool, constipation and diarrhea.  Endocrine: Negative for increased urination.  Genitourinary:  Negative for involuntary urination.  Musculoskeletal:  Positive for joint pain, joint pain, joint swelling, myalgias, muscle weakness, morning  stiffness and myalgias. Negative for gait problem and muscle tenderness.  Skin:  Positive for sensitivity to sunlight. Negative for color change, rash and hair loss.  Allergic/Immunologic: Negative for susceptible to infections.  Neurological:  Negative for dizziness and headaches.  Hematological:  Negative for swollen glands.  Psychiatric/Behavioral:  Positive for sleep disturbance. Negative for depressed mood. The patient is nervous/anxious.     PMFS History:  Patient Active Problem List   Diagnosis Date Noted   Uterine fibroid 04/30/2023   Rheumatoid arthritis with rheumatoid factor of multiple sites without organ or systems involvement (HCC) 06/19/2017   High risk medication use 06/18/2017   Vitamin D deficiency 06/18/2017   Primary insomnia 05/19/2017   Essential hypertension 05/19/2017   ANA positive 05/08/2017   Rheumatoid factor positive 05/08/2017    Past Medical History:  Diagnosis Date   Elevated LFTs    Follows w/ [facility].   Fatty liver    Follows with [facility].   History of adenomatous polyp of colon    Hypertension    Follows w/ [facility].   Lupus (HCC) 2018   Follows with [facility].   Rheumatoid arthritis (HCC)    seropositive rheumatoid arthritis, follows with [facility]   Wears contact lenses    Wears dentures    full set   Wears glasses     Family History  Problem Relation Age of Onset   Healthy Son    Healthy Daughter    Past  Surgical History:  Procedure Laterality Date   COLONOSCOPY  12/21/2022   with [facility]   LAPAROSCOPIC CHOLECYSTECTOMY  1997   ROBOTIC ASSISTED LAPAROSCOPIC HYSTERECTOMY AND SALPINGECTOMY Bilateral 04/30/2023   Procedure: ABORTED XI ROBOTIC ASSISTED LAPAROSCOPIC HYSTERECTOMY AND SALPINGECTOMY;  Surgeon: Silverio Lay, MD;  Location: [facility] [place];  Service: Gynecology;  Laterality: Bilateral;   VAGINAL HYSTERECTOMY N/A 04/30/2023   Procedure: HYSTERECTOMY VAGINAL WITH  MYOMECTOMY AND  BILATERAL SALPINGECTOMY;  Surgeon: Silverio Lay, MD;  Location: [facility] [place];  Service: Gynecology;  Laterality: N/A;   Social History   Social History Narrative   Not on file   Immunization History  Administered Date(s) Administered   PFIZER(Purple Top)SARS-COV-2 Vaccination 09/24/2020, 10/15/2020     Objective: Vital Signs: BP (!) 146/78 (BP Location: Left Arm, Patient Position: Sitting, Cuff Size: Normal)   Pulse 88   Resp 14   Ht 5\' 10"  (1.778 m)   Wt 207 lb (93.9 kg)   LMP 09/14/2017   BMI 29.70 kg/m    Physical Exam Vitals and nursing note reviewed.  Constitutional:      Appearance: She is well-developed.  HENT:     Head: Normocephalic and atraumatic.  Eyes:     Conjunctiva/sclera: Conjunctivae normal.  Cardiovascular:     Rate and Rhythm: Normal rate and regular rhythm.     Heart sounds: Normal heart sounds.  Pulmonary:     Effort: Pulmonary effort is normal.     Breath sounds: Normal breath sounds.  Abdominal:     General: Bowel sounds are normal.     Palpations: Abdomen is soft.  Musculoskeletal:     Cervical back: Normal range of motion.  Lymphadenopathy:     Cervical: No cervical adenopathy.  Skin:    General: Skin is warm and dry.     Capillary Refill: Capillary refill takes less than 2 seconds.  Neurological:     Mental Status: She is alert and oriented to person, place, and time.  Psychiatric:        Behavior: Behavior normal.      Musculoskeletal Exam: C-spine, thoracic spine, and lumbar spine good ROM.  Painful limited range of motion of both shoulder joints with tenderness upon palpation bilaterally.  Elbow joints have good range of motion with no tenderness or inflammation.  Tenderness and inflammation noted in the right wrist.  Limited extension of the right wrist joint noted.  Tenderness and synovitis involving several MCP joints as described below.  Tenderness over several PIP joints.  Difficulty making a  complete fist.  Hip joints have good range of motion with no groin pain.  Warmth and swelling noted in both knees.  Ankle joints have good range of motion with no tenderness or joint swelling.  CDAI Exam: CDAI Score: 36  Patient Global: 70 / 100; Provider Global: 70 / 100 Swollen: 6 ; Tender: 16  Joint Exam 09/10/2023      Right  Left  Glenohumeral   Tender   Tender  Wrist  Swollen Tender   Tender  MCP 1   Tender   Tender  MCP 2  Swollen Tender   Tender  MCP 3  Swollen Tender     MCP 4  Swollen Tender     PIP 2 (finger)   Tender   Tender  PIP 3 (finger)   Tender   Tender  Knee  Swollen Tender  Swollen Tender     Investigation: No additional findings.  Imaging: No results found.  Recent Labs: Lab Results  Component Value Date   WBC 6.1 06/09/2023   HGB 11.8 06/09/2023   PLT 250 06/09/2023   NA 138 06/09/2023   K 4.1 06/09/2023   CL 103 06/09/2023   CO2 28 06/09/2023   GLUCOSE 100 (H) 06/09/2023   BUN 6 (L) 06/09/2023   CREATININE 0.80 06/09/2023   BILITOT 0.3 06/09/2023   ALKPHOS 64 04/27/2023   AST 41 (H) 06/09/2023   ALT 63 (H) 06/09/2023   PROT 7.2 06/09/2023   ALBUMIN 3.6 04/27/2023   CALCIUM 9.7 06/09/2023   GFRAA 82 07/03/2021   QFTBGOLDPLUS NEGATIVE 08/16/2021    Speciality Comments: PLQ Eye Exam:11/25/2021 WNL @ [facility] Follow up in 6 months, methotrexate-fatigue nausea and brain fog Arava-hair loss, inadequate response  Procedures:  No procedures performed Allergies: Acetaminophen-codeine   Assessment / Plan:     Visit Diagnoses: Seropositive rheumatoid arthritis of multiple sites [facility]): Patient presents today experiencing a flare involving multiple joints.  Her flare started 1 week ago with no identifiable trigger.  She is having painful and limited range of motion of both shoulder joints, limited extension of the left wrist, and inflammation involving both hands and both knee joints.  She is currently taking sulfasalazine 500 mg 1  tablet by mouth twice daily.  She is tolerating sulfasalazine without any side effects and has not had any interruptions in therapy.  Different treatment options were discussed today including increasing the dose of sulfasalazine versus adding a medication on his combination therapy.  Patient would like to remain on the current dose of sulfasalazine as prescribed since she has clinically been doing well prior to this flare.  A prednisone taper starting at 20 mg tapering by 5 mg every 4 days was sent to the pharmacy today.  Instructions were provided including taking prednisone first thing in the morning with food and to avoid the use of NSAIDs.  She was vies notify us if she starts to have recurrent flares.  She will follow-up in the office in 3 months or sooner if needed.  High risk medication use - Sulfasalazine 500 mg 1 tablet by mouth twice daily.Previous therapy: Enbrel initiated on 08/29/21-discontinued in July 2023-recurrent infections.  CBC and CMP updated on 06/09/2023.  Patient requested to come back for lab work this month once she is feeling better. No recent or recurrent infections. Discussed the importance of holding sulfasalazine if she develops signs or symptoms of an infection and to resume once the infection has completely cleared. - Plan: CBC with Differential/Platelet, COMPLETE METABOLIC PANEL WITH GFR  Elevated LFTs - Evaluated by [facility] for elevated LFTs on 03/16/23.  Felt to be due to fatty liver recommended low-fat/low-cholesterol diet, regular exercise, and weight loss. Future order for CMP was placed today to be drawn this month.- Plan: COMPLETE METABOLIC PANEL WITH GFR  Primary osteoarthritis of both hands: PIP and DIP thickening consistent with osteoarthritis of both hands.  Patient presents today experiencing a rheumatoid arthritis flare involving both hands.  Limited extension of the right wrist was noted.  Tenderness and synovitis of several MCP joints noted.  A prednisone  taper was sent to the pharmacy as discussed above.  Primary osteoarthritis of left hip: Good range of motion of the left hip with no groin pain currently.  Primary osteoarthritis of both feet: She is not experiencing any increased discomfort in her feet at this time.  She is good range of motion of both ankle joints with no tenderness  or synovitis.  History of vitamin D deficiency: She is taking vitamin D 2000 units daily.  Primary insomnia - She takes Trazodone 50 mg at bedtime as needed.   History of hypertension: Blood pressure was 146/78 today in the office.  Patient was advised to monitor blood pressure closely.  Orders: Orders Placed This Encounter  Procedures   CBC with Differential/Platelet   COMPLETE METABOLIC PANEL WITH GFR   Meds ordered this encounter  Medications   predniSONE (DELTASONE) 5 MG tablet    Sig: Take 4 tablets by mouth daily x4 days, 3 tablets daily x4 days, 2 tablets daily x4 days, 1 tablet daily x4 days.    Dispense:  40 tablet    Refill:  0      Follow-Up Instructions: Return in about 3 months (around 12/10/2023) for Rheumatoid arthritis.   Gearldine Bienenstock, PA-C  Note - This record has been created using Dragon software.  Chart creation errors have been sought, but may not always  have been located. Such creation errors do not reflect on  the standard of medical care.

## 2023-09-10 ENCOUNTER — Encounter: Payer: Self-pay | Admitting: Physician Assistant

## 2023-09-10 ENCOUNTER — Ambulatory Visit: Payer: BC Managed Care – PPO | Attending: Physician Assistant | Admitting: Physician Assistant

## 2023-09-10 VITALS — BP 146/78 | HR 88 | Resp 14 | Ht 70.0 in | Wt 207.0 lb

## 2023-09-10 DIAGNOSIS — Z8679 Personal history of other diseases of the circulatory system: Secondary | ICD-10-CM

## 2023-09-10 DIAGNOSIS — M19072 Primary osteoarthritis, left ankle and foot: Secondary | ICD-10-CM

## 2023-09-10 DIAGNOSIS — M0579 Rheumatoid arthritis with rheumatoid factor of multiple sites without organ or systems involvement: Secondary | ICD-10-CM | POA: Diagnosis not present

## 2023-09-10 DIAGNOSIS — M19041 Primary osteoarthritis, right hand: Secondary | ICD-10-CM

## 2023-09-10 DIAGNOSIS — M1612 Unilateral primary osteoarthritis, left hip: Secondary | ICD-10-CM

## 2023-09-10 DIAGNOSIS — M19071 Primary osteoarthritis, right ankle and foot: Secondary | ICD-10-CM

## 2023-09-10 DIAGNOSIS — Z79899 Other long term (current) drug therapy: Secondary | ICD-10-CM | POA: Diagnosis not present

## 2023-09-10 DIAGNOSIS — M19042 Primary osteoarthritis, left hand: Secondary | ICD-10-CM

## 2023-09-10 DIAGNOSIS — R7989 Other specified abnormal findings of blood chemistry: Secondary | ICD-10-CM | POA: Diagnosis not present

## 2023-09-10 DIAGNOSIS — Z8639 Personal history of other endocrine, nutritional and metabolic disease: Secondary | ICD-10-CM

## 2023-09-10 DIAGNOSIS — F5101 Primary insomnia: Secondary | ICD-10-CM

## 2023-09-10 MED ORDER — PREDNISONE 5 MG PO TABS: ORAL_TABLET | ORAL | 0 refills | Status: DC

## 2023-09-10 NOTE — Patient Instructions (Signed)
Standing Labs We placed an order today for your standing lab work.   Please have your standing labs drawn in September and every 3 months   Please have your labs drawn 2 weeks prior to your appointment so that the provider can discuss your lab results at your appointment, if possible.  Please note that you may see your imaging and lab results in MyChart before we have reviewed them. We will contact you once all results are reviewed. Please allow our office up to 72 hours to thoroughly review all of the results before contacting the office for clarification of your results.  WALK-IN LAB HOURS  Monday through Thursday from 8:00 am -12:30 pm and 1:00 pm-5:00 pm and Friday from 8:00 am-12:00 pm.  Patients with office visits requiring labs will be seen before walk-in labs.  You may encounter longer than normal wait times. Please allow additional time. Wait times may be shorter on  Monday and Thursday afternoons.  We do not book appointments for walk-in labs. We appreciate your patience and understanding with our staff.   Labs are drawn by [facility]. Please bring your co-pay at the time of your lab draw.  You may receive a bill from [facility] for your lab work.  Please note if you are on Hydroxychloroquine and and an order has been placed for a Hydroxychloroquine level,  you will need to have it drawn 4 hours or more after your last dose.  If you wish to have your labs drawn at another location, please call the office 24 hours in advance so we can fax the orders.  The office is located at [address], [address], [place], [place] [address]   If you have any questions regarding directions or hours of operation,  please call [phone].   As a reminder, please drink plenty of water prior to coming for your lab work. Thanks!  

## 2023-10-29 ENCOUNTER — Ambulatory Visit: Payer: BC Managed Care – PPO | Admitting: Physician Assistant

## 2023-11-09 DIAGNOSIS — Z1231 Encounter for screening mammogram for malignant neoplasm of breast: Secondary | ICD-10-CM | POA: Diagnosis not present

## 2023-11-27 NOTE — Progress Notes (Unsigned)
Office Visit Note  Patient: Shelley Morrison             Date of Birth: 02/25/1966           MRN: [id number]             PCP: Gilda Crease, MD Referring: Gilda Crease, MD Visit Date: 12/10/2023 Occupation: @GUAROCC @  Subjective:  left hip pain   History of Present Illness: Shelley Morrison is a 57 y.o. female with history of seropositive rheumatoid arthritis.  Patient remains on  Sulfasalazine 500 mg 1 tablet by mouth twice daily.  She is tolerating sulfasalazine without any side effects.  She has not had any gaps in therapy recently.  Patient presents today with increased pain in the left hip which started 4 weeks ago.  She denies any injury or fall prior to the onset of symptoms.  She is not having to take Tylenol more frequently due to severity of pain.  She is been experiencing nocturnal pain as well as difficulty when rising from a seated position due to the discomfort.  Patient states for the first 2 weeks she was walking with a limp but her symptoms have gradually started to improve.  She has tried applying heat for pain relief and has also tried going for a massage.  Patient denies any other joint pain or joint swelling at this time.    Activities of Daily Living:  Patient reports morning stiffness for  15 minutes  Patient Reports nocturnal pain.  Difficulty dressing/grooming: Denies Difficulty climbing stairs: Reports Difficulty getting out of chair: Reports Difficulty using hands for taps, buttons, cutlery, and/or writing: Denies  Review of Systems  Constitutional:  Negative for fatigue.  HENT:  Negative for mouth sores, mouth dryness and nose dryness.   Eyes:  Positive for dryness. Negative for pain and visual disturbance.  Respiratory:  Negative for cough, hemoptysis, shortness of breath and difficulty breathing.   Cardiovascular:  Negative for chest pain, palpitations, hypertension and swelling in legs/feet.  Gastrointestinal:  Negative for blood in stool,  constipation and diarrhea.  Endocrine: Negative for increased urination.  Genitourinary:  Negative for painful urination.  Musculoskeletal:  Positive for joint pain, joint pain and morning stiffness. Negative for joint swelling, myalgias, muscle weakness, muscle tenderness and myalgias.  Skin:  Negative for color change, pallor, rash, hair loss, nodules/bumps, skin tightness, ulcers and sensitivity to sunlight.  Neurological:  Negative for dizziness, numbness, headaches and weakness.  Hematological:  Negative for swollen glands.  Psychiatric/Behavioral:  Positive for sleep disturbance. Negative for depressed mood. The patient is not nervous/anxious.     PMFS History:  Patient Active Problem List   Diagnosis Date Noted   Uterine fibroid 04/30/2023   Rheumatoid arthritis with rheumatoid factor of multiple sites without organ or systems involvement (HCC) 06/19/2017   High risk medication use 06/18/2017   Vitamin D deficiency 06/18/2017   Primary insomnia 05/19/2017   Essential hypertension 05/19/2017   ANA positive 05/08/2017   Rheumatoid factor positive 05/08/2017    Past Medical History:  Diagnosis Date   Elevated LFTs    Follows w/ [facility].   Fatty liver    Follows with [facility].   History of adenomatous polyp of colon    Hypertension    Follows w/ [facility].   Lupus 2018   Follows with [facility].   Rheumatoid arthritis (HCC)    seropositive rheumatoid arthritis, follows with [facility]   Wears  contact lenses    Wears dentures    full set   Wears glasses     Family History  Problem Relation Age of Onset   Healthy Son    Healthy Daughter    Past Surgical History:  Procedure Laterality Date   COLONOSCOPY  12/21/2022   with [facility]   LAPAROSCOPIC CHOLECYSTECTOMY  1997   ROBOTIC ASSISTED LAPAROSCOPIC HYSTERECTOMY AND SALPINGECTOMY Bilateral 04/30/2023   Procedure: ABORTED XI ROBOTIC ASSISTED LAPAROSCOPIC HYSTERECTOMY AND  SALPINGECTOMY;  Surgeon: Silverio Lay, MD;  Location: [facility];  Service: Gynecology;  Laterality: Bilateral;   VAGINAL HYSTERECTOMY N/A 04/30/2023   Procedure: HYSTERECTOMY VAGINAL WITH MYOMECTOMY AND  BILATERAL SALPINGECTOMY;  Surgeon: Silverio Lay, MD;  Location: [facility];  Service: Gynecology;  Laterality: N/A;   Social History   Social History Narrative   Not on file   Immunization History  Administered Date(s) Administered   PFIZER(Purple Top)SARS-COV-2 Vaccination 09/24/2020, 10/15/2020     Objective: Vital Signs: BP 137/87 (BP Location: Left Arm, Patient Position: Sitting, Cuff Size: Normal)   Pulse 82   Resp 15   Ht 5\' 10"  (1.778 m)   Wt 209 lb (94.8 kg)   LMP 09/14/2017   BMI 29.99 kg/m    Physical Exam Vitals and nursing note reviewed.  Constitutional:      Appearance: She is well-developed.  HENT:     Head: Normocephalic and atraumatic.  Eyes:     Conjunctiva/sclera: Conjunctivae normal.  Cardiovascular:     Rate and Rhythm: Normal rate and regular rhythm.     Heart sounds: Normal heart sounds.  Pulmonary:     Effort: Pulmonary effort is normal.     Breath sounds: Normal breath sounds.  Abdominal:     General: Bowel sounds are normal.     Palpations: Abdomen is soft.  Musculoskeletal:     Cervical back: Normal range of motion.  Lymphadenopathy:     Cervical: No cervical adenopathy.  Skin:    General: Skin is warm and dry.     Capillary Refill: Capillary refill takes less than 2 seconds.  Neurological:     Mental Status: She is alert and oriented to person, place, and time.  Psychiatric:        Behavior: Behavior normal.      Musculoskeletal Exam: C-spine, thoracic spine, lumbar spine have good range of motion.  No midline spinal tenderness.  No SI joint tenderness.  Shoulder joints, elbow joints, wrist joints, MCPs, PIPs, DIPs have good range of motion with no synovitis.  CMC, PIP, DIP thickening consistent with  osteoarthritis of both hands.  Complete fist formation bilaterally.  Right hip has good range of motion with no discomfort.  Left hip has slightly limited range of motion.  Tenderness over the left trochanteric bursa noted.  Knee joints have good range of motion with crepitus bilaterally.  No warmth or effusion noted.  Ankle joints have good range of motion with no tenderness or joint swelling.  CDAI Exam: CDAI Score: -- Patient Global: --; Provider Global: -- Swollen: --; Tender: -- Joint Exam 12/10/2023   No joint exam has been documented for this visit   There is currently no information documented on the homunculus. Go to the Rheumatology activity and complete the homunculus joint exam.  Investigation: No additional findings.  Imaging: No results found.  Recent Labs: Lab Results  Component Value Date   WBC 6.1 06/09/2023   HGB 11.8 06/09/2023   PLT 250 06/09/2023  NA 138 06/09/2023   K 4.1 06/09/2023   CL 103 06/09/2023   CO2 28 06/09/2023   GLUCOSE 100 (H) 06/09/2023   BUN 6 (L) 06/09/2023   CREATININE 0.80 06/09/2023   BILITOT 0.3 06/09/2023   ALKPHOS 64 04/27/2023   AST 41 (H) 06/09/2023   ALT 63 (H) 06/09/2023   PROT 7.2 06/09/2023   ALBUMIN 3.6 04/27/2023   CALCIUM 9.7 06/09/2023   GFRAA 82 07/03/2021   QFTBGOLDPLUS NEGATIVE 08/16/2021    Speciality Comments: PLQ Eye Exam:11/25/2021 WNL @ [facility] Follow up in 6 months, methotrexate-fatigue nausea and brain fog Arava-hair loss, inadequate response  Procedures:  Large Joint Inj: L greater trochanter on 12/10/2023 3:17 PM Indications: pain Details: 27 G 1.5 in needle, lateral approach  Arthrogram: No  Medications: 1.5 mL lidocaine 1 %; 40 mg triamcinolone acetonide 40 MG/ML Aspirate: 0 mL Outcome: tolerated well, no immediate complications Procedure, treatment alternatives, risks and benefits explained, specific risks discussed. Consent was given by the patient. Immediately prior to  procedure a time out was called to verify the correct patient, procedure, equipment, support staff and site/side marked as required. Patient was prepped and draped in the usual sterile fashion.     Allergies: Acetaminophen-codeine   Assessment / Plan:     Visit Diagnoses: Seropositive rheumatoid arthritis of multiple sites [facility]): She has no synovitis on examination today.  She has not had any signs or symptoms of a rheumatoid arthritis flare.  She has been taking sulfasalazine 500 mg 1 tablet twice daily.  She is tolerating sulfasalazine without any side effects.  She has not missed any doses of sulfasalazine recently.  No recent or recurrent infections.  She presents today with discomfort in the left hip consistent with trochanter bursitis.  A left trochanteric bursa cortisone injection was performed today.  She is advised to notify us if her symptoms persist or worsen.  She will remain on sulfasalazine as prescribed.  She was advised to notify us if she develops signs or symptoms of a flare.  She will follow-up in the office in 5 months or sooner if needed.  High risk medication use - Sulfasalazine 500 mg 1 tablet by mouth twice daily.Previous therapy: Enbrel initiated on 08/29/21-discontinued in July 2023-recurrent infections.  CBC and CMP updated on 06/09/23. Orders for CBC and CMP released today. Her next lab work will be due in March and every 3 months.   Discussed the importance of holding sulfasalazine if she develops signs or symptoms of an infection and to resume once the infection has completely cleared.  - Plan: CBC with Differential/Platelet, COMPLETE METABOLIC PANEL WITH GFR  Elevated LFTs - Evaluated by [facility] for elevated LFTs on 03/16/23.  She has been having to take Tylenol as needed for pain relief.  Plan to check CMP today.- Plan: COMPLETE METABOLIC PANEL WITH GFR  Primary osteoarthritis of both hands: She has PIP and DIP thickening consistent with osteoarthritis of both hands.  She  has no synovitis on examination today.  She is able to make a complete fist bilaterally.  Primary osteoarthritis of left hip: X-rays of the left hip were ordered on 08/06/2020 which were consistent with mild osteoarthritis.  No chondrocalcinosis noted.  No SI joint narrowing noted.  Trochanteric bursitis, left hip: Presents today with discomfort on the lateral aspect of her left hip x 4 weeks.  No injury or fall prior to the onset of symptoms.  She has had difficulty rising from a seated position as  well as ambulating for long distances due to the discomfort.  She has also had difficulty sleeping at night due to nocturnal pain.  On examination she has slightly limited range of motion of the left hip as well as tenderness over the left trochanteric bursa.  X-rays of the left hip were consistent with mild osteoarthritis on 08/06/2020.  She is not currently experiencing any groin pain with range of motion of the left hip.  Different treatment options were discussed.  Patient requested a left trochanteric bursa cortisone injection today.  She tolerated the procedure well.  Procedure note was completed above.  Aftercare was discussed.  She was advised to notify us if her symptoms persist or worsen.  Once her symptoms have improved she was encouraged to perform daily stretching exercises.  She was given a handout of these exercises to perform. FMLA paperwork will be filled out again as requested.  She has had to miss work due to the severity of discomfort. Paperwork for a temporary handicap placard was also provided for the patient.  Primary osteoarthritis of both feet: She is not experiencing any increased discomfort in her feet at this time.  She has good range of motion of both ankle joints with no joint tenderness or joint swelling.  She is wearing proper fitting shoes.  History of vitamin D deficiency: She is taking vitamin D 2000 units daily.  Primary insomnia -She takes Trazodone 50 mg at bedtime as  needed.  History of hypertension - Advised patient to monitor blood pressure closely following the cortisone injection today.  Orders: Orders Placed This Encounter  Procedures   Large Joint Inj   CBC with Differential/Platelet   COMPLETE METABOLIC PANEL WITH GFR   No orders of the defined types were placed in this encounter.    Follow-Up Instructions: Return in about 5 months (around 05/09/2024) for Rheumatoid arthritis.   Gearldine Bienenstock, PA-C  Note - This record has been created using Dragon software.  Chart creation errors have been sought, but may not always  have been located. Such creation errors do not reflect on  the standard of medical care.

## 2023-12-10 ENCOUNTER — Ambulatory Visit: Payer: BC Managed Care – PPO | Attending: Physician Assistant | Admitting: Physician Assistant

## 2023-12-10 ENCOUNTER — Encounter: Payer: Self-pay | Admitting: Physician Assistant

## 2023-12-10 VITALS — BP 137/87 | HR 82 | Resp 15 | Ht 70.0 in | Wt 209.0 lb

## 2023-12-10 DIAGNOSIS — M19072 Primary osteoarthritis, left ankle and foot: Secondary | ICD-10-CM

## 2023-12-10 DIAGNOSIS — M19042 Primary osteoarthritis, left hand: Secondary | ICD-10-CM

## 2023-12-10 DIAGNOSIS — F5101 Primary insomnia: Secondary | ICD-10-CM

## 2023-12-10 DIAGNOSIS — M1612 Unilateral primary osteoarthritis, left hip: Secondary | ICD-10-CM

## 2023-12-10 DIAGNOSIS — Z79899 Other long term (current) drug therapy: Secondary | ICD-10-CM

## 2023-12-10 DIAGNOSIS — M7062 Trochanteric bursitis, left hip: Secondary | ICD-10-CM

## 2023-12-10 DIAGNOSIS — R7989 Other specified abnormal findings of blood chemistry: Secondary | ICD-10-CM

## 2023-12-10 DIAGNOSIS — M0579 Rheumatoid arthritis with rheumatoid factor of multiple sites without organ or systems involvement: Secondary | ICD-10-CM | POA: Diagnosis not present

## 2023-12-10 DIAGNOSIS — M19041 Primary osteoarthritis, right hand: Secondary | ICD-10-CM | POA: Diagnosis not present

## 2023-12-10 DIAGNOSIS — Z8639 Personal history of other endocrine, nutritional and metabolic disease: Secondary | ICD-10-CM

## 2023-12-10 DIAGNOSIS — M19071 Primary osteoarthritis, right ankle and foot: Secondary | ICD-10-CM

## 2023-12-10 DIAGNOSIS — Z8679 Personal history of other diseases of the circulatory system: Secondary | ICD-10-CM

## 2023-12-10 MED ORDER — LIDOCAINE HCL 1 % IJ SOLN
1.5000 mL | INTRAMUSCULAR | Status: AC | PRN
Start: 2023-12-10 — End: 2023-12-10
  Administered 2023-12-10: 1.5 mL

## 2023-12-10 MED ORDER — TRIAMCINOLONE ACETONIDE 40 MG/ML IJ SUSP
40.0000 mg | INTRAMUSCULAR | Status: AC | PRN
Start: 2023-12-10 — End: 2023-12-10
  Administered 2023-12-10: 40 mg via INTRA_ARTICULAR

## 2023-12-10 NOTE — Patient Instructions (Addendum)
Standing Labs We placed an order today for your standing lab work.   Please have your standing labs drawn in March and every 3 months   Please have your labs drawn 2 weeks prior to your appointment so that the provider can discuss your lab results at your appointment, if possible.  Please note that you may see your imaging and lab results in MyChart before we have reviewed them. We will contact you once all results are reviewed. Please allow our office up to 72 hours to thoroughly review all of the results before contacting the office for clarification of your results.  WALK-IN LAB HOURS  Monday through Thursday from 8:00 am -12:30 pm and 1:00 pm-5:00 pm and Friday from 8:00 am-12:00 pm.  Patients with office visits requiring labs will be seen before walk-in labs.  You may encounter longer than normal wait times. Please allow additional time. Wait times may be shorter on  Monday and Thursday afternoons.  We do not book appointments for walk-in labs. We appreciate your patience and understanding with our staff.   Labs are drawn by [facility]. Please bring your co-pay at the time of your lab draw.  You may receive a bill from [facility] for your lab work.  Please note if you are on Hydroxychloroquine and and an order has been placed for a Hydroxychloroquine level,  you will need to have it drawn 4 hours or more after your last dose.  If you wish to have your labs drawn at another location, please call the office 24 hours in advance so we can fax the orders.  The office is located at [address], [address], [place], [place] [address]   If you have any questions regarding directions or hours of operation,  please call [phone].   As a reminder, please drink plenty of water prior to coming for your lab work. Thanks!   Hip Bursitis Rehab Ask your health care provider which exercises are safe for you. Do exercises exactly as told by your health care provider and adjust them as directed. It is  normal to feel mild stretching, pulling, tightness, or discomfort as you do these exercises. Stop right away if you feel sudden pain or your pain gets worse. Do not begin these exercises until told by your health care provider. Stretching exercise This exercise warms up your muscles and joints and improves the movement and flexibility of your hip. This exercise also helps to relieve pain and stiffness. Iliotibial band stretch An iliotibial band is a strong band of muscle tissue that runs from the outer side of your hip to the outer side of your thigh and knee. Lie on your side with your left / right leg in the top position. Bend your left / right knee and grab your ankle. Stretch out your bottom arm to help you balance. Slowly bring your knee back so your thigh is slightly behind your body. Slowly lower your knee toward the floor until you feel a gentle stretch on the outside of your left / right thigh. If you do not feel a stretch and your knee will not lower more toward the floor, place the heel of your other foot on top of your knee and pull your knee down toward the floor with your foot. Hold this position for __________ seconds. Slowly return to the starting position. Repeat __________ times. Complete this exercise __________ times a day. Strengthening exercises These exercises build strength and endurance in your hip and pelvis. Endurance is  the ability to use your muscles for a long time, even after they get tired. Bridge This exercise strengthens the muscles that move your thigh backward (hip extensors). Lie on your back on a firm surface with your knees bent and your feet flat on the floor. Tighten your buttocks muscles and lift your buttocks off the floor until your trunk is level with your thighs. Do not arch your back. You should feel the muscles working in your buttocks and the back of your thighs. If you do not feel these muscles, slide your feet 1-2 inches (2.5-5 cm) farther away  from your buttocks. If this exercise is too easy, try doing it with your arms crossed over your chest. Hold this position for __________ seconds. Slowly lower your hips to the starting position. Let your muscles relax completely after each repetition. Repeat __________ times. Complete this exercise __________ times a day. Squats This exercise strengthens the muscles in front of your thigh and knee (quadriceps). Stand in front of a table, with your feet and knees pointing straight ahead. You may rest your hands on the table for balance but not for support. Slowly bend your knees and lower your hips like you are going to sit in a chair. Keep your weight over your heels, not over your toes. Keep your lower legs upright so they are parallel with the table legs. Do not let your hips go lower than your knees. Do not bend lower than told by your health care provider. If your hip pain increases, do not bend as low. Hold the squat position for __________ seconds. Slowly push with your legs to return to standing. Do not use your hands to pull yourself to standing. Repeat __________ times. Complete this exercise __________ times a day. Hip hike  Stand sideways on a bottom step. Stand on your left / right leg with your other foot unsupported next to the step. You can hold on to the railing or wall for balance if needed. Keep your knees straight and your torso square. Then lift your left / right hip up toward the ceiling. Hold this position for __________ seconds. Slowly let your left / right hip lower toward the floor, past the starting position. Your foot should get closer to the floor. Do not lean or bend your knees. Repeat __________ times. Complete this exercise __________ times a day. Single leg stand This exercise increases your balance. Without shoes, stand near a railing or in a doorway. You may hold on to the railing or door frame as needed for balance. Squeeze your left / right buttock  muscles, then lift up your other foot. Do not let your left / right hip push out to the side. It is helpful to stand in front of a mirror for this exercise so you can watch your hip. Hold this position for __________ seconds. Repeat __________ times. Complete this exercise __________ times a day. This information is not intended to replace advice given to you by your health care provider. Make sure you discuss any questions you have with your health care provider. Document Revised: 11/27/2021 Document Reviewed: 11/27/2021 Elsevier Patient Education  2024 [employer].

## 2023-12-11 LAB — COMPLETE METABOLIC PANEL WITH GFR
AG Ratio: 1.2 (calc) (ref 1.0–2.5)
ALT: 74 U/L — ABNORMAL HIGH (ref 6–29)
AST: 38 U/L — ABNORMAL HIGH (ref 10–35)
Albumin: 4.1 g/dL (ref 3.6–5.1)
Alkaline phosphatase (APISO): 74 U/L (ref 37–153)
BUN: 11 mg/dL (ref 7–25)
CO2: 30 mmol/L (ref 20–32)
Calcium: 10.3 mg/dL (ref 8.6–10.4)
Chloride: 102 mmol/L (ref 98–110)
Creat: 0.82 mg/dL (ref 0.50–1.03)
Globulin: 3.5 g/dL (ref 1.9–3.7)
Glucose, Bld: 116 mg/dL — ABNORMAL HIGH (ref 65–99)
Potassium: 3.9 mmol/L (ref 3.5–5.3)
Sodium: 139 mmol/L (ref 135–146)
Total Bilirubin: 0.3 mg/dL (ref 0.2–1.2)
Total Protein: 7.6 g/dL (ref 6.1–8.1)
eGFR: 83 mL/min/{1.73_m2} (ref >=60)

## 2023-12-11 LAB — CBC WITH DIFFERENTIAL/PLATELET
Absolute Lymphocytes: 3323 {cells}/uL (ref 850–3900)
Absolute Monocytes: 543 {cells}/uL (ref 200–950)
Basophils Absolute: 40 {cells}/uL (ref 0–200)
Basophils Relative: 0.6 %
Eosinophils Absolute: 201 {cells}/uL (ref 15–500)
Eosinophils Relative: 3 %
HCT: 40.5 % (ref 35.0–45.0)
Hemoglobin: 13.3 g/dL (ref 11.7–15.5)
MCH: 29.4 pg (ref 27.0–33.0)
MCHC: 32.8 g/dL (ref 32.0–36.0)
MCV: 89.4 fL (ref 80.0–100.0)
MPV: 11.1 fL (ref 7.5–12.5)
Monocytes Relative: 8.1 %
Neutro Abs: 2593 {cells}/uL (ref 1500–7800)
Neutrophils Relative %: 38.7 %
Platelets: 236 10*3/uL (ref 140–400)
RBC: 4.53 10*6/uL (ref 3.80–5.10)
RDW: 12.1 % (ref 11.0–15.0)
Total Lymphocyte: 49.6 %
WBC: 6.7 10*3/uL (ref 3.8–10.8)

## 2023-12-11 NOTE — Progress Notes (Signed)
CBC WNL  Glucose is 116.   AST and ALT are both elevated-stable. Try to avoid the use of tylenol and alcohol use.

## 2024-02-26 ENCOUNTER — Other Ambulatory Visit: Payer: Self-pay | Admitting: Rheumatology

## 2024-02-26 NOTE — Telephone Encounter (Signed)
 Last Fill: 08/19/2023  Labs: 12/10/2023 CBC WNL  Glucose is 116.  AST and ALT are both elevated-stable.   Next Visit: 05/10/2024  Last Visit: 12/10/2023  DX: Seropositive rheumatoid arthritis of multiple sites   Current Dose per office note 12/10/2023: Sulfasalazine 500 mg 1 tablet by mouth twice daily.   Okay to refill Sulfasalazine?

## 2024-03-31 ENCOUNTER — Other Ambulatory Visit: Payer: BC Managed Care – PPO

## 2024-04-26 NOTE — Progress Notes (Signed)
 Office Visit Note  Patient: Shelley Morrison             Date of Birth: 08-16-1966           MRN: [id number]             PCP: Bufford Carne, FNP Referring: Cleo Dace, MD Visit Date: 05/10/2024 Occupation: @GUAROCC @  Subjective:  Pain in joints   History of Present Illness: Shelley Morrison is a 58 y.o. female with seropositive rheumatoid arthritis and osteoarthritis.    She returns today after her last visit in December 2024.She states she had a good response to left trochanteric bursa injection but it lasted only for few days and the symptoms came back.  She has been having discomfort walking and also sleeping on the left side.  She would like to have her handicap placard prescription renewed.  She continues to have some stiffness in her hands due to underlying osteoarthritis.  She states that she is wearing [place] shoes her discomfort in her feet has improved.  She states she has been taking sulfasalazine  500 mg 1 tablet twice a day without any interruption.    Activities of Daily Living:  Patient reports morning stiffness for 15 minutes.   Patient Denies nocturnal pain.  Difficulty dressing/grooming: Denies Difficulty climbing stairs: Denies Difficulty getting out of chair: Denies Difficulty using hands for taps, buttons, cutlery, and/or writing: Reports  Review of Systems  Constitutional:  Negative for fatigue.  HENT:  Negative for mouth sores and mouth dryness.   Eyes:  Positive for dryness.  Respiratory:  Negative for shortness of breath.   Cardiovascular:  Negative for chest pain and palpitations.  Gastrointestinal:  Positive for constipation. Negative for blood in stool and diarrhea.  Endocrine: Negative for increased urination.  Genitourinary:  Negative for involuntary urination.  Musculoskeletal:  Positive for joint pain, joint pain and morning stiffness. Negative for gait problem, joint swelling, myalgias, muscle weakness, muscle tenderness and myalgias.  Skin:   Negative for color change, rash, hair loss and sensitivity to sunlight.  Allergic/Immunologic: Negative for susceptible to infections.  Neurological:  Negative for dizziness and headaches.  Hematological:  Negative for swollen glands.  Psychiatric/Behavioral:  Positive for sleep disturbance. Negative for depressed mood. The patient is not nervous/anxious.     PMFS History:  Patient Active Problem List   Diagnosis Date Noted   Uterine fibroid 04/30/2023   Rheumatoid arthritis with rheumatoid factor of multiple sites without organ or systems involvement (HCC) 06/19/2017   High risk medication use 06/18/2017   Vitamin D  deficiency 06/18/2017   Primary insomnia 05/19/2017   Essential hypertension 05/19/2017   ANA positive 05/08/2017   Rheumatoid factor positive 05/08/2017    Past Medical History:  Diagnosis Date   Elevated LFTs    Follows w/ [facility].   Fatty liver    Follows with [facility].   History of adenomatous polyp of colon    Hypertension    Follows w/ [facility].   Lupus 2018   Follows with [facility].   Rheumatoid arthritis (HCC)    seropositive rheumatoid arthritis, follows with [facility]   Wears contact lenses    Wears dentures    full set   Wears glasses     Family History  Problem Relation Age of Onset   Healthy Son    Healthy Daughter    Past Surgical History:  Procedure Laterality Date   COLONOSCOPY  12/21/2022  with [facility]   LAPAROSCOPIC CHOLECYSTECTOMY  1997   ROBOTIC ASSISTED LAPAROSCOPIC HYSTERECTOMY AND SALPINGECTOMY Bilateral 04/30/2023   Procedure: ABORTED XI ROBOTIC ASSISTED LAPAROSCOPIC HYSTERECTOMY AND SALPINGECTOMY;  Surgeon: Ona Bidding, MD;  Location: [facility] [place];  Service: Gynecology;  Laterality: Bilateral;   VAGINAL HYSTERECTOMY N/A 04/30/2023   Procedure: HYSTERECTOMY VAGINAL WITH MYOMECTOMY AND  BILATERAL SALPINGECTOMY;  Surgeon: Ona Bidding, MD;  Location: [facility];  Service: Gynecology;  Laterality: N/A;   Social History   Social History Narrative   Not on file   Immunization History  Administered Date(s) Administered   PFIZER(Purple Top)SARS-COV-2 Vaccination 09/24/2020, 10/15/2020     Objective: Vital Signs: BP 138/82 (BP Location: Left Arm, Patient Position: Sitting, Cuff Size: Large)   Pulse 77   Resp 14   Ht 5\' 10"  (1.778 m)   Wt 209 lb (94.8 kg)   LMP 09/14/2017   BMI 29.99 kg/m    Physical Exam Vitals and nursing note reviewed.  Constitutional:      Appearance: She is well-developed.  HENT:     Head: Normocephalic and atraumatic.  Eyes:     Conjunctiva/sclera: Conjunctivae normal.  Cardiovascular:     Rate and Rhythm: Normal rate and regular rhythm.     Heart sounds: Normal heart sounds.  Pulmonary:     Effort: Pulmonary effort is normal.     Breath sounds: Normal breath sounds.  Abdominal:     General: Bowel sounds are normal.     Palpations: Abdomen is soft.  Musculoskeletal:     Cervical back: Normal range of motion.  Lymphadenopathy:     Cervical: No cervical adenopathy.  Skin:    General: Skin is warm and dry.     Capillary Refill: Capillary refill takes less than 2 seconds.  Neurological:     Mental Status: She is alert and oriented to person, place, and time.  Psychiatric:        Behavior: Behavior normal.      Musculoskeletal Exam: Cervical, thoracic and lumbar spine were in good range of motion.  Shoulder joints, elbow joints, wrist joints, MCPs PIPs and DIPs with good range of motion with no synovitis.  She had bilateral CMC, PIP and DIP thickening.  No synovitis was noted.  Hip joints had some limitation with range of motion and tenderness over left trochanteric bursa.  Knee joints with good range of motion without any warmth swelling or effusion.  There was no tenderness over ankles or MTPs.  CDAI Exam: CDAI Score: -- Patient Global: 0 / 100; Provider Global: 0 / 100 Swollen: --; Tender:  -- Joint Exam 05/10/2024   No joint exam has been documented for this visit   There is currently no information documented on the homunculus. Go to the Rheumatology activity and complete the homunculus joint exam.  Investigation: No additional findings.  Imaging: No results found.  Recent Labs: Lab Results  Component Value Date   WBC 6.7 12/10/2023   HGB 13.3 12/10/2023   PLT 236 12/10/2023   NA 139 12/10/2023   K 3.9 12/10/2023   CL 102 12/10/2023   CO2 30 12/10/2023   GLUCOSE 116 (H) 12/10/2023   BUN 11 12/10/2023   CREATININE 0.82 12/10/2023   BILITOT 0.3 12/10/2023   ALKPHOS 64 04/27/2023   AST 38 (H) 12/10/2023   ALT 74 (H) 12/10/2023   PROT 7.6 12/10/2023   ALBUMIN 3.6 04/27/2023   CALCIUM 10.3 12/10/2023   GFRAA 82 07/03/2021  QFTBGOLDPLUS NEGATIVE 08/16/2021    Speciality Comments: PLQ Eye Exam:11/25/2021 WNL @ [facility] Follow up in 6 months, methotrexate -fatigue nausea and brain fog Arava -hair loss, inadequate response  Procedures:  No procedures performed Allergies: Acetaminophen -codeine   Assessment / Plan:     Visit Diagnoses: Seropositive rheumatoid arthritis of multiple sites (HCC)-patient denies having a flare of rheumatoid arthritis.  She continues to have some stiffness or discomfort in her joints but she has not noticed any joint swelling.  She complains of stiffness in her bilateral hands and her left trochanteric bursa.  She states her foot pain has improved since she has been wearing proper fitting shoes.  No synovitis was noted on the examination today.  High risk medication use - Sulfasalazine  500 mg 1 tablet by mouth twice daily.December 10, 2023 CBC was normal.  CMP showed elevated LFTs AST 38 and ALT 74.  Patient relates it to fatty liver.  Will recheck labs today.  She was advised to get labs every 3 months.  Information immunization was also placed in the AVS.  Previous therapy: Enbrel  initiated on 08/29/21-discontinued in July  2023-recurrent infections. - Plan: CBC with Differential/Platelet, Comprehensive metabolic panel with GFR  Elevated LFTs - Evaluated by [facility] for elevated LFTs on 03/16/23.  Ultrasound liver on December 31, 2022 was suggestive of fatty liver.  She also had CT scan abdomen and pelvis on January 25, 2023.  Primary osteoarthritis of both hands-she had bilateral PIP and DIP thickening.  No synovitis was noted.  Joint protection muscle strengthening was discussed.  Primary osteoarthritis of left hip -she had limited range of motion of bilateral hip joints.  X-rays of the left hip were ordered on 08/06/2020 which were consistent with mild osteoarthritis.  No chondrocalcinosis noted.  No SI joint narrowing noted.  Trochanteric bursitis, left hip -she had good response to left trochanteric bursa injection at the last visit.  She states the relief lasted only for few days and then the symptoms recurred.  A handout on IT band stretches was given.  I also offered physical therapy but patient would like to wait.  X-rays of the left hip were consistent with mild osteoarthritis on 08/06/2020.  Patient requested a handicap placard which was filled and was given.  Primary osteoarthritis of both feet-she has noticed improvement in her feet discomfort since she has been wearing proper fitting shoes.  Other medical problems are listed as follows:  History of vitamin D  deficiency  Primary insomnia - Trazodone 50 mg at bedtime as needed.  History of hypertension-blood sugars normal at 138/82.  Dyslipidemia - Plan: Lipid panel  Orders: Orders Placed This Encounter  Procedures   CBC with Differential/Platelet   Comprehensive metabolic panel with GFR   Lipid panel   No orders of the defined types were placed in this encounter.    Follow-Up Instructions: Return in about 5 months (around 10/10/2024) for Rheumatoid arthritis.   Nicholas Bari, MD  Note - This record has been created using Animal nutritionist.   Chart creation errors have been sought, but may not always  have been located. Such creation errors do not reflect on  the standard of medical care.

## 2024-05-10 ENCOUNTER — Ambulatory Visit: Payer: BC Managed Care – PPO | Attending: Rheumatology | Admitting: Rheumatology

## 2024-05-10 ENCOUNTER — Encounter: Payer: Self-pay | Admitting: Rheumatology

## 2024-05-10 VITALS — BP 138/82 | HR 77 | Resp 14 | Ht 70.0 in | Wt 209.0 lb

## 2024-05-10 DIAGNOSIS — M19041 Primary osteoarthritis, right hand: Secondary | ICD-10-CM

## 2024-05-10 DIAGNOSIS — Z8679 Personal history of other diseases of the circulatory system: Secondary | ICD-10-CM

## 2024-05-10 DIAGNOSIS — E785 Hyperlipidemia, unspecified: Secondary | ICD-10-CM

## 2024-05-10 DIAGNOSIS — M0579 Rheumatoid arthritis with rheumatoid factor of multiple sites without organ or systems involvement: Secondary | ICD-10-CM

## 2024-05-10 DIAGNOSIS — M7062 Trochanteric bursitis, left hip: Secondary | ICD-10-CM

## 2024-05-10 DIAGNOSIS — R7989 Other specified abnormal findings of blood chemistry: Secondary | ICD-10-CM

## 2024-05-10 DIAGNOSIS — M19072 Primary osteoarthritis, left ankle and foot: Secondary | ICD-10-CM

## 2024-05-10 DIAGNOSIS — Z79899 Other long term (current) drug therapy: Secondary | ICD-10-CM

## 2024-05-10 DIAGNOSIS — Z8639 Personal history of other endocrine, nutritional and metabolic disease: Secondary | ICD-10-CM

## 2024-05-10 DIAGNOSIS — M1612 Unilateral primary osteoarthritis, left hip: Secondary | ICD-10-CM

## 2024-05-10 DIAGNOSIS — F5101 Primary insomnia: Secondary | ICD-10-CM

## 2024-05-10 DIAGNOSIS — M19042 Primary osteoarthritis, left hand: Secondary | ICD-10-CM

## 2024-05-10 DIAGNOSIS — M19071 Primary osteoarthritis, right ankle and foot: Secondary | ICD-10-CM

## 2024-05-10 NOTE — Patient Instructions (Addendum)
 Standing Labs We placed an order today for your standing lab work.   Please have your standing labs drawn in August  and every 3 months  Please have your labs drawn 2 weeks prior to your appointment so that the provider can discuss your lab results at your appointment, if possible.  Please note that you may see your imaging and lab results in MyChart before we have reviewed them. We will contact you once all results are reviewed. Please allow our office up to 72 hours to thoroughly review all of the results before contacting the office for clarification of your results.  WALK-IN LAB HOURS  Monday through Thursday from 8:00 am -12:30 pm and 1:00 pm-4:00 pm and Friday from 8:00 am-12:00 pm.  Patients with office visits requiring labs will be seen before walk-in labs.  You may encounter longer than normal wait times. Please allow additional time. Wait times may be shorter on  Monday and Thursday afternoons.  We do not book appointments for walk-in labs. We appreciate your patience and understanding with our staff.   Labs are drawn by [facility]. Please bring your co-pay at the time of your lab draw.  You may receive a bill from [facility] for your lab work.  Please note if you are on Hydroxychloroquine  and and an order has been placed for a Hydroxychloroquine  level,  you will need to have it drawn 4 hours or more after your last dose.  If you wish to have your labs drawn at another location, please call the office 24 hours in advance so we can fax the orders.  The office is located at [address], [place], [place] [address]   If you have any questions regarding directions or hours of operation,  please call [phone].   As a reminder, please drink plenty of water  prior to coming for your lab work. Thanks!  Vaccines You are taking a medication(s) that can suppress your immune system.  The following immunizations are recommended: Flu annually Covid-19  Td/Tdap (tetanus,  diphtheria, pertussis) every 10 years Pneumonia (Prevnar 15 then Pneumovax 23 at least 1 year apart.  Alternatively, can take Prevnar 20 without needing additional dose) Shingrix: 2 doses from 4 weeks to 6 months apart  Please check with your PCP to make sure you are up to date.   If you have signs or symptoms of an infection or start antibiotics: First, call your PCP for workup of your infection. Hold your medication through the infection, until you complete your antibiotics, and until symptoms resolve if you take the following: Injectable medication (Actemra, Benlysta, Cimzia, Cosentyx, Enbrel , Humira, Kevzara, Orencia, Remicade, Simponi, Stelara, Taltz, Tremfya) Methotrexate  Leflunomide  (Arava ) Mycophenolate (Cellcept) Xeljanz, Olumiant, or Rinvoq    Iliotibial Band Syndrome Rehab Ask your health care provider which exercises are safe for you. Do exercises exactly as told by your provider and adjust them as told. It's normal to feel mild stretching, pulling, tightness, or discomfort as you do these exercises. Stop right away if you feel sudden pain or your pain gets a lot worse. Do not begin these exercises until told by your provider. Stretching and range-of-motion exercises These exercises warm up your muscles and joints. They also improve the movement and flexibility of your hip and pelvis. Quadriceps stretch, prone  Lie face down (prone) on a firm surface like a bed or padded floor. Bend your left / right knee. Reach back to hold your ankle or pant leg. If you can't reach your ankle  or pant leg, use a belt looped around your foot and grab the belt instead. Gently pull your heel toward your butt. Your knee should not slide out to the side. You should feel a stretch in the front of your thigh and knee, also called the quadriceps. Hold this position for __________ seconds. Repeat __________ times. Complete this exercise __________ times a day. Iliotibial band stretch The iliotibial  band is a strip of tissue that runs along the outside of your hip down to your knee. Lie on your side with your left / right leg on top. Bend both knees and grab your left / right ankle. Stretch out your bottom arm to help you balance. Slowly bring your top knee back so your thigh goes behind your back. Slowly lower your top leg toward the floor until you feel a gentle stretch on the outside of your left / right hip and thigh. If you don't feel a stretch and your knee won't go farther, place the heel of your other foot on top of your knee and pull your knee down toward the floor with your foot. Hold this position for __________ seconds. Repeat __________ times. Complete this exercise __________ times a day. Strengthening exercises These exercises build strength and endurance in your hip and pelvis. Endurance means your muscles can keep working even when they're tired. Straight leg raises, side-lying This exercise strengthens the muscles that rotate the leg at the hip and move it away from your body. These muscles are called hip abductors. Lie on your side with your left / right leg on top. Lie so your head, shoulder, hip, and knee line up. You can bend your bottom knee to help you balance. Roll your hips slightly forward so they're stacked directly over each other. Your left / right knee should face forward. Tense the muscles in your outer thigh and hip. Lift your top leg 4-6 inches (10-15 cm) off the ground. Hold this position for __________ seconds. Slowly lower your leg back down to the starting position. Let your muscles fully relax before doing this exercise again. Repeat __________ times. Complete this exercise __________ times a day. Leg raises, prone This exercise strengthens the muscles that move the hips backward. These muscles are called hip extensors. Lie face down (prone) on your bed or a firm surface. You can put a pillow under your hips for comfort and to support your lower  back. Bend your left / right knee so your foot points straight up toward the ceiling. Keep the other leg straight and behind you. Squeeze your butt muscles. Lift your left / right thigh off the firm surface. Do not let your back arch. Tense your thigh muscle as hard as you can without having more knee pain. Hold this position for __________ seconds. Slowly lower your leg to the starting position. Allow your leg to relax all the way. Repeat __________ times. Complete this exercise __________ times a day. Hip hike  Stand sideways on a bottom step. Place your feet so that your left / right leg is on the step, and the other foot is hanging off the side. If you need support for balance, hold onto a railing or wall. Keep your knees straight and your abdomen square, meaning your hips are level. Then, lift your left / right hip up toward the ceiling. Slowly let your leg that's hanging off the step lower towards the floor. Your foot should get closer to the ground. Do not lean or bend your  knees during this movement. Repeat __________ times. Complete this exercise __________ times a day. This information is not intended to replace advice given to you by your health care provider. Make sure you discuss any questions you have with your health care provider. Document Revised: 02/27/2023 Document Reviewed: 02/27/2023 Elsevier Patient Education  2024 [employer].

## 2024-05-11 ENCOUNTER — Ambulatory Visit: Payer: Self-pay | Admitting: Rheumatology

## 2024-05-11 LAB — CBC WITH DIFFERENTIAL/PLATELET
Absolute Lymphocytes: 3630 {cells}/uL (ref 850–3900)
Absolute Monocytes: 680 {cells}/uL (ref 200–950)
Basophils Absolute: 33 {cells}/uL (ref 0–200)
Basophils Relative: 0.5 %
Eosinophils Absolute: 92 {cells}/uL (ref 15–500)
Eosinophils Relative: 1.4 %
HCT: 39.3 % (ref 35.0–45.0)
Hemoglobin: 13 g/dL (ref 11.7–15.5)
MCH: 29.1 pg (ref 27.0–33.0)
MCHC: 33.1 g/dL (ref 32.0–36.0)
MCV: 88.1 fL (ref 80.0–100.0)
MPV: 10.5 fL (ref 7.5–12.5)
Monocytes Relative: 10.3 %
Neutro Abs: 2165 {cells}/uL (ref 1500–7800)
Neutrophils Relative %: 32.8 %
Platelets: 226 10*3/uL (ref 140–400)
RBC: 4.46 10*6/uL (ref 3.80–5.10)
RDW: 12.4 % (ref 11.0–15.0)
Total Lymphocyte: 55 %
WBC: 6.6 10*3/uL (ref 3.8–10.8)

## 2024-05-11 LAB — COMPREHENSIVE METABOLIC PANEL WITH GFR
AG Ratio: 1.3 (calc) (ref 1.0–2.5)
ALT: 94 U/L — ABNORMAL HIGH (ref 6–29)
AST: 64 U/L — ABNORMAL HIGH (ref 10–35)
Albumin: 4.2 g/dL (ref 3.6–5.1)
Alkaline phosphatase (APISO): 65 U/L (ref 37–153)
BUN: 10 mg/dL (ref 7–25)
CO2: 31 mmol/L (ref 20–32)
Calcium: 9.9 mg/dL (ref 8.6–10.4)
Chloride: 101 mmol/L (ref 98–110)
Creat: 0.8 mg/dL (ref 0.50–1.03)
Globulin: 3.3 g/dL (ref 1.9–3.7)
Glucose, Bld: 85 mg/dL (ref 65–99)
Potassium: 4.1 mmol/L (ref 3.5–5.3)
Sodium: 136 mmol/L (ref 135–146)
Total Bilirubin: 0.3 mg/dL (ref 0.2–1.2)
Total Protein: 7.5 g/dL (ref 6.1–8.1)
eGFR: 86 mL/min/{1.73_m2} (ref >=60)

## 2024-05-11 LAB — LIPID PANEL
Cholesterol: 165 mg/dL (ref <200)
HDL: 47 mg/dL — ABNORMAL LOW (ref >=50)
LDL Cholesterol (Calc): 92 mg/dL
Non-HDL Cholesterol (Calc): 118 mg/dL (ref <130)
Total CHOL/HDL Ratio: 3.5 (calc) (ref <5.0)
Triglycerides: 161 mg/dL — ABNORMAL HIGH (ref <150)

## 2024-05-11 NOTE — Progress Notes (Signed)
 CBC normal, LDL normal, triglycerides elevated at 161, HDL is low,CMP shows elevated liver function AST 64 and ALT 94.  Patient should avoid Tylenol  and all NSAIDs.  Please forward results to her PCP and gastroenterologist.

## 2024-05-25 ENCOUNTER — Other Ambulatory Visit: Payer: Self-pay | Admitting: Physician Assistant

## 2024-05-25 ENCOUNTER — Ambulatory Visit
Admission: RE | Admit: 2024-05-25 | Discharge: 2024-05-25 | Disposition: A | Discharge status: Home / Self Care | Source: Ambulatory Visit | Attending: Family Medicine | Admitting: Family Medicine

## 2024-05-25 DIAGNOSIS — E2839 Other primary ovarian failure: Secondary | ICD-10-CM

## 2024-05-25 NOTE — Telephone Encounter (Signed)
 Last Fill: 02/26/2024  Labs: 05/10/2024 CBC normal, LDL normal, triglycerides elevated at 161, HDL is low,CMP shows elevated liver function AST 64 and ALT 94.   Next Visit: 10/10/2024  Last Visit: 05/10/2024  DX: Seropositive rheumatoid arthritis of multiple sites   Current Dose per office note 05/10/2024: Sulfasalazine  500 mg 1 tablet by mouth twice daily   Okay to refill Sulfasalazine ?

## 2024-08-12 DIAGNOSIS — E119 Type 2 diabetes mellitus without complications: Secondary | ICD-10-CM

## 2024-08-12 HISTORY — DX: Type 2 diabetes mellitus without complications: E11.9

## 2024-09-26 NOTE — Progress Notes (Unsigned)
 Office Visit Note  Patient: Shelley Morrison             Date of Birth: 20-Aug-1966           MRN: [id number]             PCP: Dyane Anthony RAMAN, FNP Referring: Dyane Anthony RAMAN, FNP Visit Date: 10/10/2024 Occupation: Data Unavailable  Subjective:  Left hand pain   History of Present Illness: Shelley Morrison is a 58 y.o. female with history of seropositive rheumatoid arthritis and osteoarthritis.  Patient remains on sulfasalazine  500 mg 1 tablet by mouth twice daily.  She is tolerating sulfasalazine  without any side effects and has not had any gaps in therapy.  Patient reports that since Friday she has had increased pain and stiffness in the left hand and wrist.  Patient states that her symptoms are most severe first thing in the morning.  Patient states that the pain is a 7 out of 10 in the morning and the stiffness has been lingering into the afternoon.  She has been taking turmeric on a daily basis.  She denies any overuse or repetitive activities. She denies any recent or recurrent infections.   Activities of Daily Living:  Patient reports morning stiffness for  none.   Patient Denies nocturnal pain.  Difficulty dressing/grooming: Denies Difficulty climbing stairs: Denies Difficulty getting out of chair: Denies Difficulty using hands for taps, buttons, cutlery, and/or writing: Denies  Review of Systems  Constitutional:  Negative for fatigue.  HENT:  Negative for mouth sores and mouth dryness.   Eyes:  Positive for dryness.  Respiratory:  Negative for shortness of breath.   Cardiovascular:  Negative for chest pain and palpitations.  Gastrointestinal:  Positive for constipation. Negative for blood in stool and diarrhea.  Endocrine: Negative for increased urination.  Genitourinary:  Negative for involuntary urination.  Musculoskeletal:  Positive for joint pain, joint pain and joint swelling. Negative for gait problem, myalgias, muscle weakness, morning stiffness, muscle tenderness and  myalgias.  Skin:  Negative for color change, rash, hair loss and sensitivity to sunlight.  Allergic/Immunologic: Negative for susceptible to infections.  Neurological:  Negative for dizziness and headaches.  Hematological:  Negative for swollen glands.  Psychiatric/Behavioral:  Negative for depressed mood and sleep disturbance. The patient is not nervous/anxious.     PMFS History:  Patient Active Problem List   Diagnosis Date Noted   Uterine fibroid 04/30/2023   Rheumatoid arthritis with rheumatoid factor of multiple sites without organ or systems involvement (HCC) 06/19/2017   High risk medication use 06/18/2017   Vitamin D  deficiency 06/18/2017   Primary insomnia 05/19/2017   Essential hypertension 05/19/2017   ANA positive 05/08/2017   Rheumatoid factor positive 05/08/2017    Past Medical History:  Diagnosis Date   Diabetes type 2 (HCC) 08/12/2024   Elevated LFTs    Follows w/ [facility].   Fatty liver    Follows with [facility].   History of adenomatous polyp of colon    Hypertension    Follows w/ [facility].   Lupus 2018   Follows with [facility].   Rheumatoid arthritis (HCC)    seropositive rheumatoid arthritis, follows with [facility]   Wears contact lenses    Wears dentures    full set   Wears glasses     Family History  Problem Relation Age of Onset   Healthy Son    Healthy Daughter    Past Surgical History:  Procedure Laterality Date   COLONOSCOPY  12/21/2022   with [facility]   LAPAROSCOPIC CHOLECYSTECTOMY  1997   ROBOTIC ASSISTED LAPAROSCOPIC HYSTERECTOMY AND SALPINGECTOMY Bilateral 04/30/2023   Procedure: ABORTED XI ROBOTIC ASSISTED LAPAROSCOPIC HYSTERECTOMY AND SALPINGECTOMY;  Surgeon: Darcel Pool, MD;  Location: [facility];  Service: Gynecology;  Laterality: Bilateral;   VAGINAL HYSTERECTOMY N/A 04/30/2023   Procedure: HYSTERECTOMY VAGINAL WITH MYOMECTOMY AND  BILATERAL SALPINGECTOMY;  Surgeon: Darcel Pool, MD;  Location: [facility];  Service: Gynecology;  Laterality: N/A;   Social History   Tobacco Use   Smoking status: Former    Current packs/day: 0.00    Average packs/day: 0.8 packs/day for 10.0 years (7.5 ttl pk-yrs)    Types: Cigarettes    Start date: 07/14/1994    Quit date: 07/14/2004    Years since quitting: 20.2    Passive exposure: Never   Smokeless tobacco: Never  Vaping Use   Vaping status: Never Used  Substance Use Topics   Alcohol use: Not Currently   Drug use: Never   Social History   Social History Narrative   Not on file     Immunization History  Administered Date(s) Administered   PFIZER(Purple Top)SARS-COV-2 Vaccination 09/24/2020, 10/15/2020     Objective: Vital Signs: BP 138/83   Pulse 99   Temp (!) 97.5 F (36.4 C)   Resp 16   Ht 5' 10 (1.778 m)   Wt 192 lb 9.6 oz (87.4 kg)   LMP 09/14/2017   BMI 27.64 kg/m    Physical Exam Vitals and nursing note reviewed.  Constitutional:      Appearance: She is well-developed.  HENT:     Head: Normocephalic and atraumatic.  Eyes:     Conjunctiva/sclera: Conjunctivae normal.  Cardiovascular:     Rate and Rhythm: Normal rate and regular rhythm.     Heart sounds: Normal heart sounds.  Pulmonary:     Effort: Pulmonary effort is normal.     Breath sounds: Normal breath sounds.  Abdominal:     General: Bowel sounds are normal.     Palpations: Abdomen is soft.  Musculoskeletal:     Cervical back: Normal range of motion.  Lymphadenopathy:     Cervical: No cervical adenopathy.  Skin:    General: Skin is warm and dry.     Capillary Refill: Capillary refill takes less than 2 seconds.  Neurological:     Mental Status: She is alert and oriented to person, place, and time.  Psychiatric:        Behavior: Behavior normal.      Musculoskeletal Exam: C-spine, thoracic spine, lumbar spine have good range of motion.  No midline spinal tenderness.  No SI joint tenderness.  Shoulder  joints and elbow joints have good ROM.  Tenderness of the left wrist and left 4th and 5th MCP joints.  CMC, PIP, DIP thickening.  Complete fist formation bilaterally.  Hip joints have good range of motion with no groin pain.  Knee joints have good range of motion no warmth or effusion.  Ankle joints have good range of motion no tenderness or joint swelling.  No evidence of Achilles tendinitis.    CDAI Exam: CDAI Score: -- Patient Global: --; Provider Global: -- Swollen: 0 ; Tender: 3  Joint Exam 10/10/2024      Right  Left  Wrist      Tender  MCP 4      Tender  MCP 5  Tender   There is currently no information documented on the homunculus. Go to the Rheumatology activity and complete the homunculus joint exam.  Investigation: No additional findings.  Imaging: No results found.  Recent Labs: Lab Results  Component Value Date   WBC 6.6 05/10/2024   HGB 13.0 05/10/2024   PLT 226 05/10/2024   NA 136 05/10/2024   K 4.1 05/10/2024   CL 101 05/10/2024   CO2 31 05/10/2024   GLUCOSE 85 05/10/2024   BUN 10 05/10/2024   CREATININE 0.80 05/10/2024   BILITOT 0.3 05/10/2024   ALKPHOS 64 04/27/2023   AST 64 (H) 05/10/2024   ALT 94 (H) 05/10/2024   PROT 7.5 05/10/2024   ALBUMIN 3.6 04/27/2023   CALCIUM 9.9 05/10/2024   GFRAA 82 07/03/2021   QFTBGOLDPLUS NEGATIVE 08/16/2021    Speciality Comments: PLQ Eye Exam:11/25/2021 WNL @ [facility] Follow up in 6 months, methotrexate -fatigue nausea and brain fog Arava -hair loss, inadequate response  Procedures:  No procedures performed Allergies: Acetaminophen -codeine   Assessment / Plan:     Visit Diagnoses: Seropositive rheumatoid arthritis of multiple sites [facility]): Patient presents today experiencing a flare involving the left wrist and left hand which started on Friday.  She has not had any overuse or repetitive activities prior to the onset of symptoms.  She has been taking sulfasalazine  500 mg 1 tablet twice daily  without interruption.  No recent or recurrent infections.  Her morning stiffness has been lasting into the afternoon and the pain is a 7 out of 10 first thing in the morning.  She has tenderness along the left wrist and the left 4th and 5th MCP joints today.  Discussed the option of increasing the dose of sulfasalazine  to 500 mg 2 tablets twice daily to try to alleviate the frequency and severity of flares.  She was in agreement.  She will follow-up in the office in 5 months or sooner if needed.  High risk medication use - Sulfasalazine  500 mg 1 tablet by mouth twice daily.  Lipid panel updated on 05/10/24.  CBC and CMP released today 05/10/24. Orders for CBC and CMP released today.  - Plan: CBC with Differential/Platelet, Comprehensive metabolic panel with GFR  Elevated LFTs -Not taking any OTC products for pain relief.  CMP updated today.   Plan: Comprehensive metabolic panel with GFR  Primary osteoarthritis of both hands: CMC joint, PIP, and DIP thickening.  No synovitis noted.    Primary osteoarthritis of left hip: No groin pain.   Trochanteric bursitis, left hip: Not currently symptomatic.   Primary osteoarthritis of both feet: She is not experiencing any discomfort in her feet at this time.  She has good ROM of both ankle joints with no tenderness or joint swelling.   Other medical conditions are listed as follows:  History of vitamin D  deficiency  Primary insomnia  History of hypertension: Blood pressure was 138/83 today in the office.  Dyslipidemia  Orders: Orders Placed This Encounter  Procedures   CBC with Differential/Platelet   Comprehensive metabolic panel with GFR   Meds ordered this encounter  Medications   sulfaSALAzine  (AZULFIDINE ) 500 MG tablet    Sig: Take 2 tablets (1,000 mg total) by mouth 2 (two) times daily.    Dispense:  360 tablet    Refill:  0     Follow-Up Instructions: Return in 5 months (on 03/10/2025) for Rheumatoid arthritis.   Waddell CHRISTELLA Craze,  PA-C  Note - This record has been created using Dragon  software.  Chart creation errors have been sought, but may not always  have been located. Such creation errors do not reflect on  the standard of medical care.

## 2024-10-10 ENCOUNTER — Ambulatory Visit: Attending: Physician Assistant | Admitting: Physician Assistant

## 2024-10-10 ENCOUNTER — Encounter: Payer: Self-pay | Admitting: Physician Assistant

## 2024-10-10 VITALS — BP 138/83 | HR 99 | Temp 97.5°F | Resp 16 | Ht 70.0 in | Wt 192.6 lb

## 2024-10-10 DIAGNOSIS — M19041 Primary osteoarthritis, right hand: Secondary | ICD-10-CM | POA: Diagnosis not present

## 2024-10-10 DIAGNOSIS — Z79899 Other long term (current) drug therapy: Secondary | ICD-10-CM

## 2024-10-10 DIAGNOSIS — E785 Hyperlipidemia, unspecified: Secondary | ICD-10-CM

## 2024-10-10 DIAGNOSIS — M19042 Primary osteoarthritis, left hand: Secondary | ICD-10-CM

## 2024-10-10 DIAGNOSIS — R7989 Other specified abnormal findings of blood chemistry: Secondary | ICD-10-CM | POA: Diagnosis not present

## 2024-10-10 DIAGNOSIS — Z8679 Personal history of other diseases of the circulatory system: Secondary | ICD-10-CM

## 2024-10-10 DIAGNOSIS — M0579 Rheumatoid arthritis with rheumatoid factor of multiple sites without organ or systems involvement: Secondary | ICD-10-CM | POA: Diagnosis not present

## 2024-10-10 DIAGNOSIS — M19071 Primary osteoarthritis, right ankle and foot: Secondary | ICD-10-CM

## 2024-10-10 DIAGNOSIS — F5101 Primary insomnia: Secondary | ICD-10-CM

## 2024-10-10 DIAGNOSIS — M1612 Unilateral primary osteoarthritis, left hip: Secondary | ICD-10-CM

## 2024-10-10 DIAGNOSIS — Z8639 Personal history of other endocrine, nutritional and metabolic disease: Secondary | ICD-10-CM

## 2024-10-10 DIAGNOSIS — M19072 Primary osteoarthritis, left ankle and foot: Secondary | ICD-10-CM

## 2024-10-10 DIAGNOSIS — M7062 Trochanteric bursitis, left hip: Secondary | ICD-10-CM

## 2024-10-10 MED ORDER — SULFASALAZINE 500 MG PO TABS: 1000.0000 mg | ORAL_TABLET | Freq: Two times a day (BID) | ORAL | 0 refills | Status: DC

## 2024-10-11 ENCOUNTER — Ambulatory Visit: Payer: Self-pay | Admitting: Physician Assistant

## 2024-10-11 LAB — COMPREHENSIVE METABOLIC PANEL WITH GFR
AG Ratio: 1.2 (calc) (ref 1.0–2.5)
ALT: 52 U/L — ABNORMAL HIGH (ref 6–29)
AST: 31 U/L (ref 10–35)
Albumin: 4.3 g/dL (ref 3.6–5.1)
Alkaline phosphatase (APISO): 74 U/L (ref 37–153)
BUN: 8 mg/dL (ref 7–25)
CO2: 29 mmol/L (ref 20–32)
Calcium: 10 mg/dL (ref 8.6–10.4)
Chloride: 103 mmol/L (ref 98–110)
Creat: 0.95 mg/dL (ref 0.50–1.03)
Globulin: 3.5 g/dL (ref 1.9–3.7)
Glucose, Bld: 78 mg/dL (ref 65–99)
Potassium: 4.1 mmol/L (ref 3.5–5.3)
Sodium: 137 mmol/L (ref 135–146)
Total Bilirubin: 0.3 mg/dL (ref 0.2–1.2)
Total Protein: 7.8 g/dL (ref 6.1–8.1)
eGFR: 70 mL/min/1.73m2 (ref >=60)

## 2024-10-11 LAB — CBC WITH DIFFERENTIAL/PLATELET
Absolute Lymphocytes: 2053 {cells}/uL (ref 850–3900)
Absolute Monocytes: 613 {cells}/uL (ref 200–950)
Basophils Absolute: 29 {cells}/uL (ref 0–200)
Basophils Relative: 0.6 %
Eosinophils Absolute: 49 {cells}/uL (ref 15–500)
Eosinophils Relative: 1 %
HCT: 42.3 % (ref 35.0–45.0)
Hemoglobin: 13.6 g/dL (ref 11.7–15.5)
MCH: 29.1 pg (ref 27.0–33.0)
MCHC: 32.2 g/dL (ref 32.0–36.0)
MCV: 90.6 fL (ref 80.0–100.0)
MPV: 10.7 fL (ref 7.5–12.5)
Monocytes Relative: 12.5 %
Neutro Abs: 2156 {cells}/uL (ref 1500–7800)
Neutrophils Relative %: 44 %
Platelets: 217 Thousand/uL (ref 140–400)
RBC: 4.67 Million/uL (ref 3.80–5.10)
RDW: 12 % (ref 11.0–15.0)
Total Lymphocyte: 41.9 %
WBC: 4.9 Thousand/uL (ref 3.8–10.8)

## 2024-10-11 NOTE — Progress Notes (Signed)
 CBC WNL ALT is elevated but has improved-52. AST is WNL.  Rest of CMP WNL.

## 2024-10-20 ENCOUNTER — Emergency Department (HOSPITAL_BASED_OUTPATIENT_CLINIC_OR_DEPARTMENT_OTHER)
Admission: EM | Admit: 2024-10-20 | Discharge: 2024-10-21 | Disposition: A | Discharge status: Home / Self Care | Attending: Emergency Medicine | Admitting: Emergency Medicine

## 2024-10-20 ENCOUNTER — Emergency Department (HOSPITAL_BASED_OUTPATIENT_CLINIC_OR_DEPARTMENT_OTHER)

## 2024-10-20 ENCOUNTER — Emergency Department (HOSPITAL_BASED_OUTPATIENT_CLINIC_OR_DEPARTMENT_OTHER): Admitting: Radiology

## 2024-10-20 DIAGNOSIS — J069 Acute upper respiratory infection, unspecified: Secondary | ICD-10-CM | POA: Diagnosis not present

## 2024-10-20 DIAGNOSIS — R509 Fever, unspecified: Secondary | ICD-10-CM | POA: Diagnosis present

## 2024-10-20 DIAGNOSIS — T80818A Extravasation of other vesicant agent, initial encounter: Secondary | ICD-10-CM | POA: Insufficient documentation

## 2024-10-20 LAB — CBC
HCT: 39.7 % (ref 36.0–46.0)
Hemoglobin: 12.9 g/dL (ref 12.0–15.0)
MCH: 29.1 pg (ref 26.0–34.0)
MCHC: 32.5 g/dL (ref 30.0–36.0)
MCV: 89.6 fL (ref 80.0–100.0)
Platelets: 179 K/uL (ref 150–400)
RBC: 4.43 MIL/uL (ref 3.87–5.11)
RDW: 12.3 % (ref 11.5–15.5)
WBC: 4.2 K/uL (ref 4.0–10.5)
nRBC: 0 % (ref 0.0–0.2)

## 2024-10-20 LAB — COMPREHENSIVE METABOLIC PANEL WITH GFR
ALT: 57 U/L — ABNORMAL HIGH (ref 0–44)
AST: 52 U/L — ABNORMAL HIGH (ref 15–41)
Albumin: 4.1 g/dL (ref 3.5–5.0)
Alkaline Phosphatase: 83 U/L (ref 38–126)
Anion gap: 10 (ref 5–15)
BUN: 8 mg/dL (ref 6–20)
CO2: 23 mmol/L (ref 22–32)
Calcium: 9.4 mg/dL (ref 8.9–10.3)
Chloride: 102 mmol/L (ref 98–111)
Creatinine, Ser: 1.04 mg/dL — ABNORMAL HIGH (ref 0.44–1.00)
GFR, Estimated: >60 mL/min (ref >60)
Glucose, Bld: 97 mg/dL (ref 70–99)
Potassium: 3.9 mmol/L (ref 3.5–5.1)
Sodium: 135 mmol/L (ref 135–145)
Total Bilirubin: 0.4 mg/dL (ref 0.0–1.2)
Total Protein: 8.1 g/dL (ref 6.5–8.1)

## 2024-10-20 LAB — TROPONIN T, HIGH SENSITIVITY
Troponin T High Sensitivity: 34 ng/L — ABNORMAL HIGH (ref 0–19)
Troponin T High Sensitivity: 33 ng/L — ABNORMAL HIGH (ref 0–19)

## 2024-10-20 LAB — RESP PANEL BY RT-PCR (RSV, FLU A&B, COVID)  RVPGX2
Influenza A by PCR: NEGATIVE
Influenza B by PCR: NEGATIVE
Resp Syncytial Virus by PCR: NEGATIVE
SARS Coronavirus 2 by RT PCR: NEGATIVE

## 2024-10-20 LAB — LIPASE, BLOOD: Lipase: 62 U/L — ABNORMAL HIGH (ref 11–51)

## 2024-10-20 MED ORDER — SODIUM CHLORIDE 0.9 % IV BOLUS
1000.0000 mL | Freq: Once | INTRAVENOUS | Status: AC
  Administered 2024-10-20: 1000 mL via INTRAVENOUS

## 2024-10-20 MED ORDER — IOHEXOL 350 MG/ML SOLN
75.0000 mL | Freq: Once | INTRAVENOUS | Status: AC | PRN
  Administered 2024-10-20: 75 mL via INTRAVENOUS

## 2024-10-20 MED ORDER — IBUPROFEN 800 MG PO TABS
800.0000 mg | ORAL_TABLET | Freq: Once | ORAL | Status: AC
  Administered 2024-10-20: 800 mg via ORAL
  Filled 2024-10-20: qty 1

## 2024-10-20 MED ORDER — ACETAMINOPHEN 325 MG PO TABS
650.0000 mg | ORAL_TABLET | Freq: Once | ORAL | Status: AC | PRN
  Administered 2024-10-20: 650 mg via ORAL
  Filled 2024-10-20: qty 2

## 2024-10-20 NOTE — ED Provider Notes (Signed)
 [facility] EMERGENCY DEPARTMENT AT [facility] Provider Note   CSN: [id number] Arrival date & time: 10/20/24  2028     Patient presents with: Fever   Shelley Morrison is a 58 y.o. female.  {Add pertinent medical, surgical, social history, OB history to YEP:67052} Patient here by BX and fever started today.  Nothing makes it worse or better.  No sick contacts but she does work at nursing type facility.  She has no pain with urination no abdominal pain no weakness numbness tingling.  She has bodyaches and chills all over.  Feeling better since Tylenol  in triage.  History of hypertension rheumatoid arthritis fatty liver disease.  The history is provided by the patient.       Prior to Admission medications   Medication Sig Start Date End Date Taking? Authorizing Provider  acetaminophen  (TYLENOL ) 500 MG tablet Take #2 tablets po every 6 hours for 5 days then prn for post operative pain Patient taking differently: as needed. Take #2 tablets po every 6 hours for 5 days then prn for post operative pain 04/30/23   Perri Bjork, PA-C  atenolol (TENORMIN) 25 MG tablet Take 25 mg by mouth daily. Patient taking differently: Take 25 mg by mouth as needed. 07/25/23   [provider]  Cholecalciferol (VITAMIN D ) 50 MCG (2000 UT) CAPS 1 capsule Orally Once a day    [provider]  cyclobenzaprine  (FLEXERIL ) 10 MG tablet at bedtime as needed. 06/28/19   [provider]  Docusate Calcium (STOOL SOFTENER PO) Take by mouth as needed.    [provider]  losartan (COZAAR) 50 MG tablet Take 50 mg by mouth daily. Patient taking differently: Take 50 mg by mouth 2 (two) times daily at 10 AM and 5 PM. 07/25/23   [provider]  Probiotic Product (PROBIOTIC PO) Take by mouth daily. Patient not taking: Reported on 10/10/2024    [provider]  RYBELSUS 7 MG TABS Take 1 tablet by mouth every morning. 09/13/24   [provider]  sulfaSALAzine   (AZULFIDINE ) 500 MG tablet Take 2 tablets (1,000 mg total) by mouth 2 (two) times daily. 10/10/24   Cheryl Waddell HERO, PA-C  traZODone (DESYREL) 100 MG tablet Take 100 mg by mouth at bedtime. Patient taking differently: Take 100 mg by mouth as needed. 03/28/24   [provider]  TURMERIC PO Take by mouth daily.    [provider]    Allergies: Acetaminophen -codeine    Review of Systems  Updated Vital Signs BP (!) 146/77 (BP Location: Right Arm)   Pulse (!) 110   Temp (!) 102.1 F (38.9 C) (Oral)   Resp 18   LMP 09/14/2017   SpO2 97%   Physical Exam Vitals and nursing note reviewed.  Constitutional:      General: She is not in acute distress.    Appearance: She is well-developed. She is not ill-appearing.  HENT:     Head: Normocephalic and atraumatic.     Nose: Nose normal.     Mouth/Throat:     Mouth: Mucous membranes are moist.  Eyes:     Extraocular Movements: Extraocular movements intact.     Conjunctiva/sclera: Conjunctivae normal.     Pupils: Pupils are equal, round, and reactive to light.  Cardiovascular:     Rate and Rhythm: Normal rate and regular rhythm.     Pulses: Normal pulses.     Heart sounds: Normal heart sounds. No murmur heard. Pulmonary:     Effort:  Pulmonary effort is normal. No respiratory distress.     Breath sounds: Normal breath sounds.  Abdominal:     General: Abdomen is flat.     Palpations: Abdomen is soft.     Tenderness: There is no abdominal tenderness.  Musculoskeletal:        General: No swelling.     Cervical back: Neck supple.  Skin:    General: Skin is warm and dry.     Capillary Refill: Capillary refill takes less than 2 seconds.  Neurological:     Mental Status: She is alert.  Psychiatric:        Mood and Affect: Mood normal.     (all labs ordered are listed, but only abnormal results are displayed) Labs Reviewed  RESP PANEL BY RT-PCR (RSV, FLU A&B, COVID)  [id number]  CBC  LIPASE, BLOOD  COMPREHENSIVE  METABOLIC PANEL WITH GFR  URINALYSIS, ROUTINE W REFLEX MICROSCOPIC  TROPONIN T, HIGH SENSITIVITY    EKG: EKG Interpretation Date/Time:  Thursday October 20 2024 20:41:33 EDT Ventricular Rate:  115 PR Interval:  146 QRS Duration:  76 QT Interval:  330 QTC Calculation: 456 R Axis:   66  Text Interpretation: Sinus tachycardia Otherwise normal ECG Confirmed by Ruthe Cornet [id number]) on 10/20/2024 8:44:15 PM  Radiology: [facility] Chest 2 View Result Date: 10/20/2024 CLINICAL DATA:  Fever and cough. EXAM: CHEST - 2 VIEW COMPARISON:  Chest radiograph dated 05/24/2022. FINDINGS: The heart size and mediastinal contours are within normal limits. Both lungs are clear. The visualized skeletal structures are unremarkable. IMPRESSION: No active cardiopulmonary disease. Electronically Signed   By: Vanetta Chou M.D.   On: 10/20/2024 21:20    {Document cardiac monitor, telemetry assessment procedure when appropriate:32947} Procedures   Medications Ordered in the ED  sodium chloride  0.9 % bolus 1,000 mL (has no administration in time range)  acetaminophen  (TYLENOL ) tablet 650 mg (650 mg Oral Given 10/20/24 2043)      {Click here for ABCD2, HEART and other calculators REFRESH Note before signing:1}                              Medical Decision Making Amount and/or Complexity of Data Reviewed Labs: ordered. Radiology: ordered.  Risk OTC drugs.   Shelley Morrison is here with fever body aches chills started today.  History of rheumatoid arthritis on sulfasalazine .  History of hypertension.  Nothing makes it worse or better.  No obvious sick contacts.  She has no abdominal pain or pain with urination.  No major cough or sputum production.  Denies any rash.  Patient febrile tachycardic upon arrival here.  Will give IV fluids and Tylenol .  Viral symptoms however.  Will hold on sepsis workup at this time as I do think that this is likely a viral process.  Will get basic labs give IV fluids check viral  panel and reevaluate.  Chest x-ray already done shows no evidence of pneumonia pneumothorax.  ***  This chart was dictated using voice recognition software.  Despite best efforts to proofread,  errors can occur which can change the documentation meaning.   {Document critical care time when appropriate  Document review of labs and clinical decision tools ie [id number], etc  Document your independent review of radiology images and any outside records  Document your discussion with family members, caretakers and with consultants  Document social determinants of health affecting pt's care  Document your decision making why or  why not admission, treatments were needed:32947:::1}   Final diagnoses:  None    ED Discharge Orders     None

## 2024-10-20 NOTE — ED Triage Notes (Signed)
 Flu symptoms today- body aches, fever 102.1. Sudden onset CP today at 1600.

## 2024-10-21 ENCOUNTER — Telehealth (HOSPITAL_BASED_OUTPATIENT_CLINIC_OR_DEPARTMENT_OTHER): Payer: Self-pay | Admitting: Radiology

## 2024-10-21 NOTE — ED Notes (Signed)
 Called to reach out to patient after having a ct scan yesterday in the ER and no answer, LVM two separate times with call back number.

## 2024-10-21 NOTE — Discharge Instructions (Signed)
 Continue Tylenol  and ibuprofen  for fever.  Return if symptoms worsen as we discussed.  This includes chest pain shortness of breath prolonged fever.  As we discussed today I did want to get a CT scan of your chest to make sure there was not a blood clot.  So if symptoms worsen please consider coming back to get that evaluated.  My hope is that this is just a viral process that we will resolve on its own within 5 days.  Please return if symptoms worsen.  Please keep your arm elevated is much as you can tonight.  Contrast medium should hopefully resorb in your body without any issues.  Return if you have any severe pain tingling weakness in the arm that had contrast extravasation.

## 2024-10-27 ENCOUNTER — Encounter (HOSPITAL_COMMUNITY): Payer: Self-pay | Admitting: Emergency Medicine

## 2024-10-27 ENCOUNTER — Emergency Department (HOSPITAL_COMMUNITY)

## 2024-10-27 ENCOUNTER — Other Ambulatory Visit: Payer: Self-pay

## 2024-10-27 ENCOUNTER — Observation Stay (HOSPITAL_COMMUNITY)
Admission: EM | Admit: 2024-10-27 | Discharge: 2024-10-29 | Disposition: A | Discharge status: Home / Self Care | Source: Ambulatory Visit | Attending: Internal Medicine | Admitting: Internal Medicine

## 2024-10-27 DIAGNOSIS — Z7982 Long term (current) use of aspirin: Secondary | ICD-10-CM | POA: Diagnosis not present

## 2024-10-27 DIAGNOSIS — Z8739 Personal history of other diseases of the musculoskeletal system and connective tissue: Secondary | ICD-10-CM

## 2024-10-27 DIAGNOSIS — R531 Weakness: Secondary | ICD-10-CM | POA: Diagnosis not present

## 2024-10-27 DIAGNOSIS — N179 Acute kidney failure, unspecified: Secondary | ICD-10-CM | POA: Diagnosis not present

## 2024-10-27 DIAGNOSIS — R112 Nausea with vomiting, unspecified: Secondary | ICD-10-CM | POA: Diagnosis present

## 2024-10-27 DIAGNOSIS — E860 Dehydration: Secondary | ICD-10-CM | POA: Diagnosis not present

## 2024-10-27 DIAGNOSIS — E119 Type 2 diabetes mellitus without complications: Secondary | ICD-10-CM | POA: Insufficient documentation

## 2024-10-27 DIAGNOSIS — Z79899 Other long term (current) drug therapy: Secondary | ICD-10-CM | POA: Insufficient documentation

## 2024-10-27 DIAGNOSIS — M329 Systemic lupus erythematosus, unspecified: Secondary | ICD-10-CM

## 2024-10-27 DIAGNOSIS — E559 Vitamin D deficiency, unspecified: Secondary | ICD-10-CM | POA: Insufficient documentation

## 2024-10-27 DIAGNOSIS — R197 Diarrhea, unspecified: Secondary | ICD-10-CM | POA: Diagnosis not present

## 2024-10-27 DIAGNOSIS — I10 Essential (primary) hypertension: Secondary | ICD-10-CM | POA: Diagnosis not present

## 2024-10-27 DIAGNOSIS — M069 Rheumatoid arthritis, unspecified: Secondary | ICD-10-CM | POA: Diagnosis not present

## 2024-10-27 LAB — BASIC METABOLIC PANEL WITH GFR
Anion gap: 12 (ref 5–15)
BUN: 31 mg/dL — ABNORMAL HIGH (ref 6–20)
CO2: 24 mmol/L (ref 22–32)
Calcium: 9.7 mg/dL (ref 8.9–10.3)
Chloride: 98 mmol/L (ref 98–111)
Creatinine, Ser: 1.52 mg/dL — ABNORMAL HIGH (ref 0.44–1.00)
GFR, Estimated: 40 mL/min — ABNORMAL LOW (ref >60)
Glucose, Bld: 93 mg/dL (ref 70–99)
Potassium: 4.2 mmol/L (ref 3.5–5.1)
Sodium: 133 mmol/L — ABNORMAL LOW (ref 135–145)

## 2024-10-27 LAB — CBC
HCT: 42.5 % (ref 36.0–46.0)
Hemoglobin: 13.2 g/dL (ref 12.0–15.0)
MCH: 27.8 pg (ref 26.0–34.0)
MCHC: 31.1 g/dL (ref 30.0–36.0)
MCV: 89.5 fL (ref 80.0–100.0)
Platelets: 225 K/uL (ref 150–400)
RBC: 4.75 MIL/uL (ref 3.87–5.11)
RDW: 12.9 % (ref 11.5–15.5)
WBC: 3 K/uL — ABNORMAL LOW (ref 4.0–10.5)
nRBC: 0 % (ref 0.0–0.2)

## 2024-10-27 LAB — TROPONIN T, HIGH SENSITIVITY: Troponin T High Sensitivity: 34 ng/L — ABNORMAL HIGH (ref 0–19)

## 2024-10-27 LAB — LIPASE, BLOOD: Lipase: 122 U/L — ABNORMAL HIGH (ref 11–51)

## 2024-10-27 MED ORDER — SODIUM CHLORIDE 0.9 % IV BOLUS
1000.0000 mL | Freq: Once | INTRAVENOUS | Status: AC
  Administered 2024-10-27: 1000 mL via INTRAVENOUS

## 2024-10-27 MED ORDER — ONDANSETRON HCL 4 MG/2ML IJ SOLN
4.0000 mg | Freq: Once | INTRAMUSCULAR | Status: AC
  Administered 2024-10-27: 4 mg via INTRAVENOUS
  Filled 2024-10-27: qty 2

## 2024-10-27 MED ORDER — PANTOPRAZOLE SODIUM 40 MG IV SOLR
40.0000 mg | Freq: Once | INTRAVENOUS | Status: AC
Start: 2024-10-27 — End: 2024-10-27
  Administered 2024-10-27: 40 mg via INTRAVENOUS
  Filled 2024-10-27: qty 10

## 2024-10-27 NOTE — ED Triage Notes (Signed)
 Patient coming to ED for evaluation of possible dehydration, nausea, vomiting, diarrhea, and decreased PO intake.  Reports symptoms started 2 weeks ago.  No reports of pain.  Reports I'm only uncomfortable.  States she has been in contact with her PCP and it was recommended to come to ED for evaluation.  Pt and visitor reports that her primary MD has written notes about why I am here. Unable to find night in regards to dehydration or abnormal labs.

## 2024-10-27 NOTE — ED Provider Notes (Signed)
 [place] EMERGENCY DEPARTMENT AT [facility] Provider Note   CSN: [id number] Arrival date & time: 10/27/24  1715     Patient presents with: Dehydration   Shelley Morrison is a 58 y.o. female.  {Add pertinent medical, surgical, social history, OB history to YEP:67052} Patient has a history of hypertension and lupus.  She states that she has been vomiting for 3 days and having loose stools.  Patient thinks it may be related to the Rybelsus she has been taking.   Weakness      Prior to Admission medications   Medication Sig Start Date End Date Taking? Authorizing Provider  acetaminophen  (TYLENOL ) 500 MG tablet Take #2 tablets po every 6 hours for 5 days then prn for post operative pain Patient taking differently: as needed. Take #2 tablets po every 6 hours for 5 days then prn for post operative pain 04/30/23   Perri Bjork, PA-C  atenolol (TENORMIN) 25 MG tablet Take 25 mg by mouth daily. Patient taking differently: Take 25 mg by mouth as needed. 07/25/23   [provider]  Cholecalciferol (VITAMIN D ) 50 MCG (2000 UT) CAPS 1 capsule Orally Once a day    [provider]  cyclobenzaprine  (FLEXERIL ) 10 MG tablet at bedtime as needed. 06/28/19   [provider]  Docusate Calcium (STOOL SOFTENER PO) Take by mouth as needed.    [provider]  losartan (COZAAR) 50 MG tablet Take 50 mg by mouth daily. Patient taking differently: Take 50 mg by mouth 2 (two) times daily at 10 AM and 5 PM. 07/25/23   [provider]  Probiotic Product (PROBIOTIC PO) Take by mouth daily. Patient not taking: Reported on 10/10/2024    [provider]  RYBELSUS 7 MG TABS Take 1 tablet by mouth every morning. 09/13/24   [provider]  sulfaSALAzine  (AZULFIDINE ) 500 MG tablet Take 2 tablets (1,000 mg total) by mouth 2 (two) times daily. 10/10/24   Cheryl Waddell HERO, PA-C  traZODone (DESYREL) 100 MG tablet Take 100 mg by mouth at bedtime. Patient  taking differently: Take 100 mg by mouth as needed. 03/28/24   [provider]  TURMERIC PO Take by mouth daily.    [provider]    Allergies: Acetaminophen -codeine    Review of Systems  Neurological:  Positive for weakness.    Updated Vital Signs BP 121/71 (BP Location: Right Arm)   Pulse 96   Temp 99 F (37.2 C) (Oral)   Resp 20   Ht 5' 10 (1.778 m)   Wt 80.7 kg   LMP 09/14/2017   SpO2 93%   BMI 25.54 kg/m   Physical Exam  (all labs ordered are listed, but only abnormal results are displayed) Labs Reviewed  BASIC METABOLIC PANEL WITH GFR - Abnormal; Notable for the following components:      Result Value   Sodium 133 (*)    BUN 31 (*)    Creatinine, Ser 1.52 (*)    GFR, Estimated 40 (*)    All other components within normal limits  CBC - Abnormal; Notable for the following components:   WBC 3.0 (*)    All other components within normal limits  TROPONIN T, HIGH SENSITIVITY - Abnormal; Notable for the following components:   Troponin T High Sensitivity 34 (*)    All other components within normal limits  LIPASE, BLOOD  URINALYSIS, ROUTINE W REFLEX MICROSCOPIC    EKG: None  Radiology: CT ABDOMEN PELVIS WO CONTRAST Result  Date: 10/27/2024 EXAM: CT ABDOMEN AND PELVIS WITHOUT CONTRAST 10/27/2024 09:58:33 PM TECHNIQUE: CT of the abdomen and pelvis was performed without the administration of intravenous contrast. Multiplanar reformatted images are provided for review. Automated exposure control, iterative reconstruction, and/or weight-based adjustment of the mA/kV was utilized to reduce the radiation dose to as low as reasonably achievable. COMPARISON: CT abdomen and pelvis from 01/22/2023. CLINICAL HISTORY: Abdominal pain, acute, nonlocalized. FINDINGS: LOWER CHEST: No acute abnormality. LIVER: The liver is unremarkable. GALLBLADDER AND BILE DUCTS: Status post cholecystectomy. No biliary ductal dilatation. SPLEEN: No acute abnormality. PANCREAS: No  acute abnormality. ADRENAL GLANDS: No acute abnormality. KIDNEYS, URETERS AND BLADDER: No stones in the kidneys or ureters. No hydronephrosis. No perinephric or periureteral stranding. Urinary bladder is unremarkable. GI AND BOWEL: Stomach demonstrates no acute abnormality. No small or large bowel thickening or dilatation. There is no bowel obstruction. Unremarkable appendix. PERITONEUM AND RETROPERITONEUM: No ascites. No free air. VASCULATURE: Aorta is normal in caliber. Moderate atherosclerotic plaque. LYMPH NODES: Prominent but non-enlarged meesenteric lyumph nodes along the small bowel vasculature. No lymphadenopathy. REPRODUCTIVE ORGANS: Status post hysterectomy. No adnexal mass. BONES AND SOFT TISSUES: No acute osseous abnormality. No focal soft tissue abnormality. IMPRESSION: 1. No acute findings in the abdomen or pelvis with limited evaluation on this noncontrast study . 2. Status post cholecystectomy and hysterectomy . Electronically signed by: Morgane Naveau MD 10/27/2024 10:04 PM EDT RP Workstation: [id number]    {Document cardiac monitor, telemetry assessment procedure when appropriate:32947} Procedures   Medications Ordered in the ED  sodium chloride  0.9 % bolus 1,000 mL (1,000 mLs Intravenous New Bag/Given 10/27/24 2211)  pantoprazole (PROTONIX) injection 40 mg (40 mg Intravenous Given 10/27/24 2209)  ondansetron  (ZOFRAN ) injection 4 mg (4 mg Intravenous Given 10/27/24 2209)      {Click here for ABCD2, HEART and other calculators REFRESH Note before signing:1}                              Medical Decision Making Amount and/or Complexity of Data Reviewed Labs: ordered. Radiology: ordered.  Risk Prescription drug management. Decision regarding hospitalization.   Dehydration and AKI.  Patient will be admitted to medicine  {Document critical care time when appropriate  Document review of labs and clinical decision tools ie CHADS2VASC2, etc  Document your independent review of  radiology images and any outside records  Document your discussion with family members, caretakers and with consultants  Document social determinants of health affecting pt's care  Document your decision making why or why not admission, treatments were needed:32947:::1}   Final diagnoses:  Dehydration  AKI (acute kidney injury)    ED Discharge Orders     None

## 2024-10-27 NOTE — H&P (Signed)
 History and Physical  [name]:[id number] DOB: [birth year] DOA: 10/27/2024  PCP: Dyane Anthony RAMAN, FNP   Chief Complaint: Dehydration, nausea and vomiting  HPI: Shelley Morrison is a 59 y.o. female with medical history significant for rheumatoid arthritis, HTN, lupus, fatty liver and T2DM who presents to the ED for evaluation of dehydration, nausea and vomiting.  Patient reports she was started on Rybelsus about 3 months ago for weight loss.  Over the last 2 weeks, she has felt dehydrated and weak.  Over the last 3 days, she has had persistent nausea and vomiting and loose stools with inability to keep any food or water  down.  She was seen by her PCP today who advised patient to present to the ED for further evaluation. Patient reports dry mouth and generalized weakness but denies any fevers, chills, headache, dizziness, chest pain, abdominal pain, bloody stools, dysuria or syncope. Patient denies eating any recent unusual or restaurant foods.  She has not had any loose stools today.  Reports stopping Rybelsus about a week ago.  ED Course: Initial vitals show temp 99.4, RR 18-20, HR 90-110, SBP 110-120, SpO2 98% on room air. Initial labs significant for sodium 133, creatinine 1.52, WBC 3.0, troponin 34, glucose 93.  CT A/P with no acute intraabdominal/pelvic pathology.  Pt received IV NS 1 L bolus, IV Zofran  and IV Protonix. [facility] was consulted for admission.   Review of Systems: Please see HPI for pertinent positives and negatives. A complete 10 system review of systems are otherwise negative.  Past Medical History:  Diagnosis Date   Diabetes type 2 (HCC) 08/12/2024   Elevated LFTs    Follows w/ [facility].   Fatty liver    Follows with [facility].   History of adenomatous polyp of colon    Hypertension    Follows w/ [facility].   Lupus 2018   Follows with [facility].   Rheumatoid arthritis (HCC)    seropositive rheumatoid arthritis, follows with [facility]  Rheumatology   Wears contact lenses    Wears dentures    full set   Wears glasses    Past Surgical History:  Procedure Laterality Date   COLONOSCOPY  12/21/2022   with [facility]   LAPAROSCOPIC CHOLECYSTECTOMY  1997   ROBOTIC ASSISTED LAPAROSCOPIC HYSTERECTOMY AND SALPINGECTOMY Bilateral 04/30/2023   Procedure: ABORTED XI ROBOTIC ASSISTED LAPAROSCOPIC HYSTERECTOMY AND SALPINGECTOMY;  Surgeon: Darcel Pool, MD;  Location: [facility];  Service: Gynecology;  Laterality: Bilateral;   VAGINAL HYSTERECTOMY N/A 04/30/2023   Procedure: HYSTERECTOMY VAGINAL WITH MYOMECTOMY AND  BILATERAL SALPINGECTOMY;  Surgeon: Darcel Pool, MD;  Location: [facility];  Service: Gynecology;  Laterality: N/A;   Social History:  reports that she quit smoking about 20 years ago. Her smoking use included cigarettes. She started smoking about 30 years ago. She has a 7.5 pack-year smoking history. She has never been exposed to tobacco smoke. She has never used smokeless tobacco. She reports that she does not currently use alcohol. She reports that she does not use drugs.  Allergies  Allergen Reactions   Acetaminophen -Codeine Other (See Comments)    Causes flank pain per patient..    Family History  Problem Relation Age of Onset   Healthy Son    Healthy Daughter      Prior to Admission medications   Medication Sig Start Date End Date Taking? Authorizing Provider  acetaminophen  (TYLENOL ) 500 MG tablet Take #2 tablets po every 6 hours  for 5 days then prn for post operative pain Patient taking differently: as needed. Take #2 tablets po every 6 hours for 5 days then prn for post operative pain 04/30/23   Perri Bjork, PA-C  atenolol (TENORMIN) 25 MG tablet Take 25 mg by mouth daily. Patient taking differently: Take 25 mg by mouth as needed. 07/25/23   [provider]  Cholecalciferol (VITAMIN D ) 50 MCG (2000 UT) CAPS 1 capsule Orally Once a day    [provider]   cyclobenzaprine  (FLEXERIL ) 10 MG tablet at bedtime as needed. 06/28/19   [provider]  Docusate Calcium (STOOL SOFTENER PO) Take by mouth as needed.    [provider]  losartan (COZAAR) 50 MG tablet Take 50 mg by mouth daily. Patient taking differently: Take 50 mg by mouth 2 (two) times daily at 10 AM and 5 PM. 07/25/23   [provider]  Probiotic Product (PROBIOTIC PO) Take by mouth daily. Patient not taking: Reported on 10/10/2024    [provider]  RYBELSUS 7 MG TABS Take 1 tablet by mouth every morning. 09/13/24   [provider]  sulfaSALAzine  (AZULFIDINE ) 500 MG tablet Take 2 tablets (1,000 mg total) by mouth 2 (two) times daily. 10/10/24   Cheryl Waddell HERO, PA-C  traZODone (DESYREL) 100 MG tablet Take 100 mg by mouth at bedtime. Patient taking differently: Take 100 mg by mouth as needed. 03/28/24   [provider]  TURMERIC PO Take by mouth daily.    [provider]    Physical Exam: BP 120/77 (BP Location: Left Arm)   Pulse 99   Temp 99.1 F (37.3 C)   Resp 18   Ht 5' 10 (1.778 m)   Wt 80.7 kg   LMP 09/14/2017   SpO2 96%   BMI 25.54 kg/m  General: Pleasant, weak appearing middle-age woman sitting on side of bed. No acute distress. HEENT: /AT. Anicteric sclera.  Dry mucous membrane CV: Mild tachycardia. Regular rhythm. No murmurs, rubs, or gallops. No LE edema Pulmonary: Lungs CTAB. Normal effort. No wheezing or rales. Abdominal: Soft, nontender, nondistended. Normal bowel sounds. Extremities: Palpable radial and DP pulses. Normal ROM. Skin: Warm and dry. No obvious rash or lesions. Decreased skin turgor Neuro: A&Ox3. Moves all extremities. Normal sensation to light touch. No focal deficit. Psych: Normal mood and affect          Labs on Admission:  Basic Metabolic Panel: Recent Labs  Lab 10/27/24 1933  NA 133*  K 4.2  CL 98  CO2 24  GLUCOSE 93  BUN 31*  CREATININE 1.52*  CALCIUM 9.7   Liver  Function Tests: No results for input(s): AST, ALT, ALKPHOS, BILITOT, PROT, ALBUMIN in the last 168 hours. Recent Labs  Lab 10/27/24 2300  LIPASE 122*   No results for input(s): AMMONIA in the last 168 hours. CBC: Recent Labs  Lab 10/27/24 1933  WBC 3.0*  HGB 13.2  HCT 42.5  MCV 89.5  PLT 225   Cardiac Enzymes: No results for input(s): CKTOTAL, CKMB, CKMBINDEX, TROPONINI in the last 168 hours. BNP (last 3 results) No results for input(s): BNP in the last 8760 hours.  ProBNP (last 3 results) No results for input(s): PROBNP in the last 8760 hours.  CBG: No results for input(s): GLUCAP in the last 168 hours.  Radiological Exams on Admission: CT ABDOMEN PELVIS WO CONTRAST Result Date: 10/27/2024 EXAM: CT ABDOMEN AND PELVIS WITHOUT CONTRAST 10/27/2024 09:58:33 PM TECHNIQUE: CT of the abdomen and pelvis  was performed without the administration of intravenous contrast. Multiplanar reformatted images are provided for review. Automated exposure control, iterative reconstruction, and/or weight-based adjustment of the mA/kV was utilized to reduce the radiation dose to as low as reasonably achievable. COMPARISON: CT abdomen and pelvis from 01/22/2023. CLINICAL HISTORY: Abdominal pain, acute, nonlocalized. FINDINGS: LOWER CHEST: No acute abnormality. LIVER: The liver is unremarkable. GALLBLADDER AND BILE DUCTS: Status post cholecystectomy. No biliary ductal dilatation. SPLEEN: No acute abnormality. PANCREAS: No acute abnormality. ADRENAL GLANDS: No acute abnormality. KIDNEYS, URETERS AND BLADDER: No stones in the kidneys or ureters. No hydronephrosis. No perinephric or periureteral stranding. Urinary bladder is unremarkable. GI AND BOWEL: Stomach demonstrates no acute abnormality. No small or large bowel thickening or dilatation. There is no bowel obstruction. Unremarkable appendix. PERITONEUM AND RETROPERITONEUM: No ascites. No free air. VASCULATURE: Aorta is normal in  caliber. Moderate atherosclerotic plaque. LYMPH NODES: Prominent but non-enlarged meesenteric lyumph nodes along the small bowel vasculature. No lymphadenopathy. REPRODUCTIVE ORGANS: Status post hysterectomy. No adnexal mass. BONES AND SOFT TISSUES: No acute osseous abnormality. No focal soft tissue abnormality. IMPRESSION: 1. No acute findings in the abdomen or pelvis with limited evaluation on this noncontrast study . 2. Status post cholecystectomy and hysterectomy . Electronically signed by: Morgane Naveau MD 10/27/2024 10:04 PM EDT RP Workstation: [id number]   Assessment/Plan Amarianna L Heid is a 58 y.o. female with medical history significant for  rheumatoid arthritis, HTN, lupus, fatty liver and T2DM who presents to the ED for evaluation of dehydration, nausea and vomiting and admitted for AKI  # AKI - Creatinine elevated to 1.52, from normal baseline, creatinine 1.04 a little over a week ago - Likely secondary to severe dehydration in the setting of nausea, vomiting and loose stools - Give IV NS 1 L bolus followed by 125 cc/h infusion - Trend renal function - Avoid nephrotoxic meds  # Nausea and vomiting # Acute diarrhea - Presented with 3 to 4 days of persistent nausea, vomiting and diarrhea - Continues to feel nauseous but has not had any loose stools since yesterday - CT A/P without contrast does not show any acute intra-abdominal or pelvic pathology - GI symptoms likely secondary to side effects of her semaglutide - IV Zofran  as needed for nausea and vomiting  # T2DM - No recent A1c on file, glucose 93 on BMP - SSI with meals, CBG monitoring - Follow-up A1c  # HTN - BP stable with SBP in the 110-120s - Hold losartan in the setting of AKI  # Rheumatoid arthritis # Lupus - Continue sulfasalazine  - Follow-up with rheumatology in the outpatient  # Vitamin D  deficiency - Check vitamin D  levels  # Generalized weakness - In the setting of severe dehydration - Check magnesium,  and phosphorus - PT/OT eval and treat   DVT prophylaxis: Lovenox     Code Status: Full Code  Consults called: None  Family Communication: Discussed admission with sister at bedside  Severity of Illness: The appropriate patient status for this patient is OBSERVATION. Observation status is judged to be reasonable and necessary in order to provide the required intensity of service to ensure the patient's safety. The patient's presenting symptoms, physical exam findings, and initial radiographic and laboratory data in the context of their medical condition is felt to place them at decreased risk for further clinical deterioration. Furthermore, it is anticipated that the patient will be medically stable for discharge from the hospital within 2 midnights of admission.   Level of care: Med-Surg  Lou Claretta HERO, MD 10/28/2024, 12:26 AM [facility] Pager: [phone] Isaiah 41:10   If 7PM-7AM, please contact night-coverage [email or web address] Password [email or web address]

## 2024-10-27 NOTE — Plan of Care (Signed)
Discuss and review plan of care with patient/family  

## 2024-10-28 DIAGNOSIS — R112 Nausea with vomiting, unspecified: Secondary | ICD-10-CM

## 2024-10-28 DIAGNOSIS — E860 Dehydration: Principal | ICD-10-CM

## 2024-10-28 DIAGNOSIS — Z8739 Personal history of other diseases of the musculoskeletal system and connective tissue: Secondary | ICD-10-CM

## 2024-10-28 DIAGNOSIS — N179 Acute kidney failure, unspecified: Secondary | ICD-10-CM | POA: Diagnosis not present

## 2024-10-28 DIAGNOSIS — R531 Weakness: Secondary | ICD-10-CM

## 2024-10-28 DIAGNOSIS — M329 Systemic lupus erythematosus, unspecified: Secondary | ICD-10-CM

## 2024-10-28 LAB — COMPREHENSIVE METABOLIC PANEL WITH GFR
ALT: 98 U/L — ABNORMAL HIGH (ref 0–44)
AST: 140 U/L — ABNORMAL HIGH (ref 15–41)
Albumin: 3.2 g/dL — ABNORMAL LOW (ref 3.5–5.0)
Alkaline Phosphatase: 62 U/L (ref 38–126)
Anion gap: 10 (ref 5–15)
BUN: 26 mg/dL — ABNORMAL HIGH (ref 6–20)
CO2: 22 mmol/L (ref 22–32)
Calcium: 8.5 mg/dL — ABNORMAL LOW (ref 8.9–10.3)
Chloride: 105 mmol/L (ref 98–111)
Creatinine, Ser: 1.23 mg/dL — ABNORMAL HIGH (ref 0.44–1.00)
GFR, Estimated: 51 mL/min — ABNORMAL LOW (ref >60)
Glucose, Bld: 77 mg/dL (ref 70–99)
Potassium: 4.1 mmol/L (ref 3.5–5.1)
Sodium: 137 mmol/L (ref 135–145)
Total Bilirubin: 0.5 mg/dL (ref 0.0–1.2)
Total Protein: 7 g/dL (ref 6.5–8.1)

## 2024-10-28 LAB — VITAMIN D 25 HYDROXY (VIT D DEFICIENCY, FRACTURES): Vit D, 25-Hydroxy: 30.82 ng/mL (ref 30–100)

## 2024-10-28 LAB — URINALYSIS, ROUTINE W REFLEX MICROSCOPIC
Bacteria, UA: NONE SEEN
Bilirubin Urine: NEGATIVE
Glucose, UA: NEGATIVE mg/dL
Ketones, ur: 5 mg/dL — AB
Leukocytes,Ua: NEGATIVE
Nitrite: NEGATIVE
Protein, ur: >=300 mg/dL — AB
Specific Gravity, Urine: 1.023 (ref 1.005–1.030)
pH: 5 (ref 5.0–8.0)

## 2024-10-28 LAB — GLUCOSE, CAPILLARY
Glucose-Capillary: 80 mg/dL (ref 70–99)
Glucose-Capillary: 89 mg/dL (ref 70–99)
Glucose-Capillary: 76 mg/dL (ref 70–99)
Glucose-Capillary: 87 mg/dL (ref 70–99)

## 2024-10-28 LAB — CBC
HCT: 37.2 % (ref 36.0–46.0)
Hemoglobin: 11.4 g/dL — ABNORMAL LOW (ref 12.0–15.0)
MCH: 27.7 pg (ref 26.0–34.0)
MCHC: 30.6 g/dL (ref 30.0–36.0)
MCV: 90.3 fL (ref 80.0–100.0)
Platelets: 183 K/uL (ref 150–400)
RBC: 4.12 MIL/uL (ref 3.87–5.11)
RDW: 13.1 % (ref 11.5–15.5)
WBC: 2.7 K/uL — ABNORMAL LOW (ref 4.0–10.5)
nRBC: 0 % (ref 0.0–0.2)

## 2024-10-28 LAB — PHOSPHORUS: Phosphorus: 3.4 mg/dL (ref 2.5–4.6)

## 2024-10-28 LAB — MAGNESIUM: Magnesium: 2.3 mg/dL (ref 1.7–2.4)

## 2024-10-28 LAB — HEMOGLOBIN A1C
Hgb A1c MFr Bld: 5.7 % — ABNORMAL HIGH (ref 4.8–5.6)
Mean Plasma Glucose: 116.89 mg/dL

## 2024-10-28 LAB — HIV ANTIBODY (ROUTINE TESTING W REFLEX): HIV Screen 4th Generation wRfx: NONREACTIVE

## 2024-10-28 MED ORDER — ENOXAPARIN SODIUM 40 MG/0.4ML IJ SOSY
40.0000 mg | PREFILLED_SYRINGE | INTRAMUSCULAR | Status: DC
  Filled 2024-10-28 (×2): qty 0.4

## 2024-10-28 MED ORDER — ACETAMINOPHEN 325 MG PO TABS
650.0000 mg | ORAL_TABLET | Freq: Four times a day (QID) | ORAL | Status: DC | PRN
  Administered 2024-10-28 (×2): 650 mg via ORAL
  Filled 2024-10-28: qty 2

## 2024-10-28 MED ORDER — BISACODYL 5 MG PO TBEC: 5.0000 mg | DELAYED_RELEASE_TABLET | Freq: Every day | ORAL | Status: DC | PRN

## 2024-10-28 MED ORDER — ONDANSETRON HCL 4 MG/2ML IJ SOLN: 4.0000 mg | Freq: Four times a day (QID) | INTRAMUSCULAR | Status: DC | PRN

## 2024-10-28 MED ORDER — TRAZODONE HCL 100 MG PO TABS
100.0000 mg | ORAL_TABLET | Freq: Every evening | ORAL | Status: DC | PRN
  Administered 2024-10-28 (×2): 100 mg via ORAL
  Filled 2024-10-28 (×2): qty 1

## 2024-10-28 MED ORDER — INSULIN ASPART 100 UNIT/ML IJ SOLN: 0.0000 [IU] | Freq: Three times a day (TID) | INTRAMUSCULAR | Status: DC

## 2024-10-28 MED ORDER — SODIUM CHLORIDE 0.9 % IV SOLN: INTRAVENOUS | Status: AC

## 2024-10-28 MED ORDER — SENNOSIDES-DOCUSATE SODIUM 8.6-50 MG PO TABS: 1.0000 | ORAL_TABLET | Freq: Every evening | ORAL | Status: DC | PRN

## 2024-10-28 MED ORDER — SODIUM CHLORIDE 0.9 % IV BOLUS
1000.0000 mL | Freq: Once | INTRAVENOUS | Status: AC
  Administered 2024-10-28: 1000 mL via INTRAVENOUS

## 2024-10-28 MED ORDER — SULFASALAZINE 500 MG PO TABS
1000.0000 mg | ORAL_TABLET | Freq: Two times a day (BID) | ORAL | Status: DC
  Administered 2024-10-28 – 2024-10-29 (×3): 1000 mg via ORAL
  Filled 2024-10-28 (×3): qty 2

## 2024-10-28 MED ORDER — ENSURE PLUS HIGH PROTEIN PO LIQD
237.0000 mL | Freq: Two times a day (BID) | ORAL | Status: DC
  Administered 2024-10-28 (×2): 237 mL via ORAL

## 2024-10-28 MED ORDER — ONDANSETRON HCL 4 MG PO TABS: 4.0000 mg | ORAL_TABLET | Freq: Four times a day (QID) | ORAL | Status: DC | PRN

## 2024-10-28 MED ORDER — ACETAMINOPHEN 650 MG RE SUPP: 650.0000 mg | Freq: Four times a day (QID) | RECTAL | Status: DC | PRN

## 2024-10-28 NOTE — Progress Notes (Signed)
 PROGRESS NOTE  Shelley Morrison FMW:[id number] DOB: 02-05-1966 DOA: 10/27/2024 PCP: Dyane Anthony RAMAN, FNP   LOS: 0 days   Brief narrative:  Shelley Morrison is a 58 y.o. female with medical history significant for rheumatoid arthritis, HTN, lupus, fatty liver and diabetes mellitus type 2 presented to hospital with nausea, vomiting and diarrhea.  Of note patient was started on Rybelsus about 3 months ago for weight loss.  Over the last 2 weeks, she has felt dehydrated and weak and 3 days prior to presentation she started having  persistent nausea and vomiting and loose stools with inability to keep any food or water  down.  Patient was then seen by her PCP and advised to come to the ED for further evaluation.  In the ED patient had temperature max of 99.4 F had mild tachycardia and tachypnea.  Labs showed sodium of 133, creatinine of 1.5.  CT abdomen and pelvis with no acute intraabdominal/pelvic pathology.  Pt received IV NS 1 L bolus, IV Zofran  and IV Protonix and was considered for admission to the hospital for further evaluation and treatment.     Assessment/Plan: Principal Problem:   AKI (acute kidney injury) Active Problems:   Dehydration   Generalized weakness   Intractable nausea and vomiting   History of systemic lupus erythematosus (HCC)   History of rheumatoid arthritis   Nausea vomiting diarrhea.  Could be gastroenteritis versus semaglutide induced.  Patient did have some fever of 100.8 F today.  Continue IV hydration.  Will check GI pathogen panel  Acute kidney injury. Creatinine on presentation at 1.5 from baseline around 1.0.  Creatinine today at 1.2.  Likely secondary to nausea vomiting and diarrhea.  Received IV fluid bolus followed by normal saline infusion.  CT A/P without contrast does not show any acute intra-abdominal or pelvic pathology.  Possibly secondary to side effects of semaglutide and/or gastroenteritis continue with IV fluids for 1 more day.   Diabetes mellitus type  2.  hemoglobin A1c of 5.7.  Continue sliding scale insulin  Essential hypertension Losartan on hold.  Blood pressure stable at this time.   Rheumatoid arthritis/Lupus - Continue sulfasalazine .  Follow-up with rheumatology in the outpatient   History of Vitamin D  deficiency Vitamin D  levels  been sent.  Generalized weakness Likely secondary to severe volume depletion.  Ambulate the patient.  Check PT.  DVT prophylaxis: enoxaparin (LOVENOX) injection 40 mg Start: 10/28/24 1000   Disposition: Likely home 11/28/2024  Status is: Observation The patient will require care spanning > 2 midnights and should be moved to inpatient because: Pending clinical improvement, IV fluids, ongoing fever,    Code Status:     Code Status: Full Code  Family Communication: Spoke with the patient's family at bedside  Consultants: None  Procedures: None  Anti-infectives:  None  Anti-infectives (From admission, onward)    None        Subjective: Today, patient was seen and examined at bedside.  Patient still complains of 3-4 episodes of loose watery diarrhea and had fever of 100.8 F in the morning.  Still does not feel completely relieved.  No nausea vomiting today  Objective: Vitals:   10/28/24 0901 10/28/24 0936  BP:  116/73  Pulse:  86  Resp:  18  Temp: 99.1 F (37.3 C)   SpO2:  93%    Intake/Output Summary (Last 24 hours) at 10/28/2024 1112 Last data filed at 10/28/2024 0600 Gross per 24 hour  Intake 360 ml  Output 100 ml  Net 260 ml   Filed Weights   10/27/24 1740  Weight: 80.7 kg   Body mass index is 25.54 kg/m.   Physical Exam:  GENERAL: Patient is alert awake and oriented. Not in obvious distress. HENT: No scleral pallor or icterus. Pupils equally reactive to light. Oral mucosa is moist NECK: is supple, no gross swelling noted. CHEST: Clear to auscultation. No crackles or wheezes.  Diminished breath sounds bilaterally. CVS: S1 and S2 heard, no murmur.  Regular rate and rhythm.  ABDOMEN: Soft, non-tender, bowel sounds are present. EXTREMITIES: No edema. CNS: Cranial nerves are intact. No focal motor deficits. SKIN: warm and dry without rashes.  Data Review: I have personally reviewed the following laboratory data and studies,  CBC: Recent Labs  Lab 10/27/24 1933 10/28/24 0336  WBC 3.0* 2.7*  HGB 13.2 11.4*  HCT 42.5 37.2  MCV 89.5 90.3  PLT 225 183   Basic Metabolic Panel: Recent Labs  Lab 10/27/24 1933 10/28/24 0336  NA 133* 137  K 4.2 4.1  CL 98 105  CO2 24 22  GLUCOSE 93 77  BUN 31* 26*  CREATININE 1.52* 1.23*  CALCIUM 9.7 8.5*  MG  --  2.3  PHOS  --  3.4   Liver Function Tests: Recent Labs  Lab 10/28/24 0336  AST 140*  ALT 98*  ALKPHOS 62  BILITOT 0.5  PROT 7.0  ALBUMIN 3.2*   Recent Labs  Lab 10/27/24 2300  LIPASE 122*   No results for input(s): AMMONIA in the last 168 hours. Cardiac Enzymes: No results for input(s): CKTOTAL, CKMB, CKMBINDEX, TROPONINI in the last 168 hours. BNP (last 3 results) No results for input(s): BNP in the last 8760 hours.  ProBNP (last 3 results) No results for input(s): PROBNP in the last 8760 hours.  CBG: Recent Labs  Lab 10/28/24 0817  GLUCAP 80   Recent Results (from the past 240 hours)  Resp panel by RT-PCR (RSV, Flu A&B, Covid) Anterior Nasal Swab     Status: None   Collection Time: 10/20/24  8:42 PM   Specimen: Anterior Nasal Swab  Result Value Ref Range Status   SARS Coronavirus 2 by RT PCR NEGATIVE NEGATIVE Final    Comment: (NOTE) SARS-CoV-2 target nucleic acids are NOT DETECTED.  The SARS-CoV-2 RNA is generally detectable in upper respiratory specimens during the acute phase of infection. The lowest concentration of SARS-CoV-2 viral copies this assay can detect is 138 copies/mL. A negative result does not preclude SARS-Cov-2 infection and should not be used as the sole basis for treatment or other patient management decisions. A  negative result may occur with  improper specimen collection/handling, submission of specimen other than nasopharyngeal swab, presence of viral mutation(s) within the areas targeted by this assay, and inadequate number of viral copies(<138 copies/mL). A negative result must be combined with clinical observations, patient history, and epidemiological information. The expected result is Negative.  Fact Sheet for Patients:  bloggercourse.com  Fact Sheet for Healthcare Providers:  seriousbroker.it  This test is no t yet approved or cleared by the [place]  FDA and  has been authorized for detection and/or diagnosis of SARS-CoV-2 by FDA under an Emergency Use Authorization (EUA). This EUA will remain  in effect (meaning this test can be used) for the duration of the COVID-19 declaration under Section 564(b)(1) of the Act, 21 U.S.C.section 360bbb-3(b)(1), unless the authorization is terminated  or revoked sooner.       Influenza A by PCR NEGATIVE NEGATIVE Final  Influenza B by PCR NEGATIVE NEGATIVE Final    Comment: (NOTE) The Xpert Xpress SARS-CoV-2/FLU/RSV plus assay is intended as an aid in the diagnosis of influenza from Nasopharyngeal swab specimens and should not be used as a sole basis for treatment. Nasal washings and aspirates are unacceptable for Xpert Xpress SARS-CoV-2/FLU/RSV testing.  Fact Sheet for Patients: bloggercourse.com  Fact Sheet for Healthcare Providers: seriousbroker.it  This test is not yet approved or cleared by the [place]  FDA and has been authorized for detection and/or diagnosis of SARS-CoV-2 by FDA under an Emergency Use Authorization (EUA). This EUA will remain in effect (meaning this test can be used) for the duration of the COVID-19 declaration under Section 564(b)(1) of the Act, 21 U.S.C. section 360bbb-3(b)(1), unless the authorization  is terminated or revoked.     Resp Syncytial Virus by PCR NEGATIVE NEGATIVE Final    Comment: (NOTE) Fact Sheet for Patients: bloggercourse.com  Fact Sheet for Healthcare Providers: seriousbroker.it  This test is not yet approved or cleared by the [place]  FDA and has been authorized for detection and/or diagnosis of SARS-CoV-2 by FDA under an Emergency Use Authorization (EUA). This EUA will remain in effect (meaning this test can be used) for the duration of the COVID-19 declaration under Section 564(b)(1) of the Act, 21 U.S.C. section 360bbb-3(b)(1), unless the authorization is terminated or revoked.  Performed at [facility], [address], [place], [place] [address]      Studies: CT ABDOMEN PELVIS WO CONTRAST Result Date: 10/27/2024 EXAM: CT ABDOMEN AND PELVIS WITHOUT CONTRAST 10/27/2024 09:58:33 PM TECHNIQUE: CT of the abdomen and pelvis was performed without the administration of intravenous contrast. Multiplanar reformatted images are provided for review. Automated exposure control, iterative reconstruction, and/or weight-based adjustment of the mA/kV was utilized to reduce the radiation dose to as low as reasonably achievable. COMPARISON: CT abdomen and pelvis from 01/22/2023. CLINICAL HISTORY: Abdominal pain, acute, nonlocalized. FINDINGS: LOWER CHEST: No acute abnormality. LIVER: The liver is unremarkable. GALLBLADDER AND BILE DUCTS: Status post cholecystectomy. No biliary ductal dilatation. SPLEEN: No acute abnormality. PANCREAS: No acute abnormality. ADRENAL GLANDS: No acute abnormality. KIDNEYS, URETERS AND BLADDER: No stones in the kidneys or ureters. No hydronephrosis. No perinephric or periureteral stranding. Urinary bladder is unremarkable. GI AND BOWEL: Stomach demonstrates no acute abnormality. No small or large bowel thickening or dilatation. There is no bowel obstruction. Unremarkable  appendix. PERITONEUM AND RETROPERITONEUM: No ascites. No free air. VASCULATURE: Aorta is normal in caliber. Moderate atherosclerotic plaque. LYMPH NODES: Prominent but non-enlarged meesenteric lyumph nodes along the small bowel vasculature. No lymphadenopathy. REPRODUCTIVE ORGANS: Status post hysterectomy. No adnexal mass. BONES AND SOFT TISSUES: No acute osseous abnormality. No focal soft tissue abnormality. IMPRESSION: 1. No acute findings in the abdomen or pelvis with limited evaluation on this noncontrast study . 2. Status post cholecystectomy and hysterectomy . Electronically signed by: Morgane Naveau MD 10/27/2024 10:04 PM EDT RP Workstation: [id number]      Vernal Alstrom, MD  [facility] 10/28/2024  If 7PM-7AM, please contact night-coverage

## 2024-10-28 NOTE — Evaluation (Signed)
 Physical Therapy Evaluation Patient Details Name: Shelley Morrison MRN: [id number] DOB: July 05, 1966 Today's Date: 10/28/2024  History of Present Illness  Shelley Morrison is a 58 y.o. female with medical history significant for rheumatoid arthritis, HTN, lupus, fatty liver and T2DM who presents to the ED for evaluation of dehydration, nausea and vomiting.  Patient reports she was started on Rybelsus about 3 months ago for weight loss.  Over the last 2 weeks, she has felt dehydrated and weak.  Over the last 3 days, she has had persistent nausea and vomiting and loose stools with inability to keep any food or water  down.  Clinical Impression   The patient is eager to increase mobility to get strength. Patient is independent and working at baseline. Patient should progress to return to independent ambulator with no device.  Encouraged LE exercises, sister in room and will asisst patient with ambulation, into hall when cleared from enteric precautions.  Pt admitted with above diagnosis.  Pt currently with functional limitations due to the deficits listed below (see PT Problem List). Pt will benefit from acute skilled PT to increase their independence and safety with mobility to allow discharge.         If plan is discharge home, recommend the following: Help with stairs or ramp for entrance;Assist for transportation;Assistance with cooking/housework   Can travel by private vehicle        Equipment Recommendations None recommended by PT  Recommendations for Other Services       Functional Status Assessment Patient has had a recent decline in their functional status and demonstrates the ability to make significant improvements in function in a reasonable and predictable amount of time.     Precautions / Restrictions Precautions Precautions: Fall      Mobility  Bed Mobility               General bed mobility comments: in recliner    Transfers Overall transfer level: Needs assistance    Transfers: Sit to/from Stand Sit to Stand: Supervision           General transfer comment: Used IV pole for in room mobility    Ambulation/Gait Ambulation/Gait assistance: Supervision Gait Distance (Feet): 60 Feet Assistive device: IV Pole Gait Pattern/deviations: Step-through pattern Gait velocity: decreased from normal speed Gait velocity interpretation: <1.31 ft/sec, indicative of household ambulator   General Gait Details: sluggish  Stairs            Wheelchair Mobility     Tilt Bed    Modified Rankin (Stroke Patients Only)       Balance Overall balance assessment: Mild deficits observed, not formally tested                                           Pertinent Vitals/Pain Pain Assessment Pain Assessment: No/denies pain    Home Living Family/patient expects to be discharged to:: Private residence Living Arrangements: Spouse/significant other;Other relatives Available Help at Discharge: Family;Available 24 hours/day Type of Home: House Home Access: Stairs to enter   Entrance Stairs-Number of Steps: 3 Alternate Level Stairs-Number of Steps: 12 Home Layout: Two level;Able to live on main level with bedroom/bathroom Home Equipment: None      Prior Function Prior Level of Function : Driving;Working/employed;Independent/Modified Independent             Mobility Comments: CNA ADLs Comments: indep  Extremity/Trunk Assessment   Upper Extremity Assessment Upper Extremity Assessment: Generalized weakness    Lower Extremity Assessment Lower Extremity Assessment: Generalized weakness    Cervical / Trunk Assessment Cervical / Trunk Assessment: Normal  Communication   Communication Communication: No apparent difficulties    Cognition Arousal: Alert Behavior During Therapy: WFL for tasks assessed/performed   PT - Cognitive impairments: No apparent impairments                         Following commands:  Intact       Cueing Cueing Techniques: Verbal cues     General Comments      Exercises     Assessment/Plan    PT Assessment Patient needs continued PT services  PT Problem List Decreased strength;Decreased activity tolerance;Decreased mobility       PT Treatment Interventions Gait training;Therapeutic exercise;Therapeutic activities    PT Goals (Current goals can be found in the Care Plan section)  Acute Rehab PT Goals Patient Stated Goal: get strong, go home PT Goal Formulation: With patient/family Time For Goal Achievement: 11/11/24 Potential to Achieve Goals: Good    Frequency Min 2X/week     Co-evaluation               AM-PAC PT 6 Clicks Mobility  Outcome Measure Help needed turning from your back to your side while in a flat bed without using bedrails?: None Help needed moving from lying on your back to sitting on the side of a flat bed without using bedrails?: None Help needed moving to and from a bed to a chair (including a wheelchair)?: A Little Help needed standing up from a chair using your arms (e.g., wheelchair or bedside chair)?: A Little Help needed to walk in hospital room?: A Little Help needed climbing 3-5 steps with a railing? : A Lot 6 Click Score: 19    End of Session   Activity Tolerance: Patient tolerated treatment well Patient left: in chair;with call bell/phone within reach;with family/visitor present Nurse Communication: Mobility status PT Visit Diagnosis: Unsteadiness on feet (R26.81);Difficulty in walking, not elsewhere classified (R26.2)    Time: 8469-8458 PT Time Calculation (min) (ACUTE ONLY): 11 min   Charges:   PT Evaluation $PT Eval Low Complexity: 1 Low   PT General Charges $$ ACUTE PT VISIT: 1 Visit         Darice Potters PT Acute Rehabilitation Services Office [phone]   Potters Darice Norris 10/28/2024, 3:48 PM

## 2024-10-28 NOTE — Evaluation (Signed)
 Occupational Therapy Evaluation Patient Details Name: Shelley Morrison MRN: [id number] DOB: June 04, 1966 Today's Date: 10/28/2024   History of Present Illness   Annick L Deeley is a 58 y.o. female with medical history significant for rheumatoid arthritis, HTN, lupus, fatty liver and T2DM who presents to the ED for evaluation of dehydration, nausea and vomiting.  Patient reports she was started on Rybelsus about 3 months ago for weight loss.  Over the last 2 weeks, she has felt dehydrated and weak.  Over the last 3 days, she has had persistent nausea and vomiting and loose stools with inability to keep any food or water  down.     Clinical Impressions Pt was seen for OT evaluation this date. Prior to hospital admission, pt was independent, working full time as a best boy at [employer]. Pt lives with her spouse in multilevel home, with supportive sister who reports she may stay with her in one level home at DC. Pt presents with deficits decreased balance, functional mobility/transfers, activity tolerance and generalized weakness impacting safe and optimal ADL completion. Pt currently requires supervision for ambulation with use of IV pole or external support and verbal cues for safety. Pt completed sink level ADL tasks with supervision and no overt LOB noted. Pt would benefit from skilled OT services to address noted impairments and functional limitations (see below for any additional details) in order to maximize safety and independence while minimizing future risk of falls, injury, and readmission. Do not anticipate the need for follow up OT services upon acute hospital DC.    If plan is discharge home, recommend the following:   A little help with walking and/or transfers;A little help with bathing/dressing/bathroom     Functional Status Assessment   Patient has had a recent decline in their functional status and demonstrates the ability to make significant improvements in function in a reasonable and  predictable amount of time.     Equipment Recommendations   None recommended by OT     Recommendations for Other Services         Precautions/Restrictions   Precautions Precautions: Fall Recall of Precautions/Restrictions: Intact Restrictions Weight Bearing Restrictions Per Provider Order: No     Mobility Bed Mobility Overal bed mobility: Modified Independent             General bed mobility comments: Utilized bed rails and increased time to complete    Transfers Overall transfer level: Needs assistance Equipment used:  (IV pole) Transfers: Sit to/from Stand Sit to Stand: Supervision           General transfer comment: Used IV pole for in room mobility      Balance Overall balance assessment: Mild deficits observed, not formally tested                                         ADL either performed or assessed with clinical judgement   ADL Overall ADL's : Needs assistance/impaired Eating/Feeding: Independent   Grooming: Wash/dry face;Wash/dry hands;Standing;Supervision/safety   Upper Body Bathing: Sitting;Supervision/ safety   Lower Body Bathing: Supervison/ safety;Sitting/lateral leans   Upper Body Dressing : Supervision/safety;Sitting   Lower Body Dressing: Supervision/safety;Sitting/lateral leans Lower Body Dressing Details (indicate cue type and reason): Via figure 4 method Toilet Transfer: Ambulation;Rolling walker (2 wheels);Contact guard assist;Supervision/safety   Toileting- Clothing Manipulation and Hygiene: Supervision/safety;Sitting/lateral lean       Functional mobility during ADLs: Contact guard assist;Supervision/safety;Rolling  walker (2 wheels) General ADL Comments: CGA progressed to supervision through demonstrated safety awareness     Vision Baseline Vision/History: 0 No visual deficits                         Pertinent Vitals/Pain Pain Assessment Pain Assessment: No/denies pain      Extremity/Trunk Assessment Upper Extremity Assessment Upper Extremity Assessment: Generalized weakness (3+ bilateral UEs)   Lower Extremity Assessment Lower Extremity Assessment: Generalized weakness;Defer to PT evaluation   Cervical / Trunk Assessment Cervical / Trunk Assessment: Normal   Communication Communication Communication: No apparent difficulties   Cognition Arousal: Alert Behavior During Therapy: WFL for tasks assessed/performed Cognition: No apparent impairments             OT - Cognition Comments: A/Ox4                 Following commands: Intact       Cueing  General Comments   Cueing Techniques: Verbal cues  Pt endorses feeling not herself, very weak   Exercises Exercises: Other exercises Other Exercises Other Exercises: Edu: Role of OT, safe ADL completion, energy conservation techniques        Home Living Family/patient expects to be discharged to:: Private residence Living Arrangements: Spouse/significant other Available Help at Discharge: Family;Available 24 hours/day Type of Home: House Home Access: Stairs to enter [employer] of Steps: 3   Home Layout: Two level;Able to live on main level with bedroom/bathroom Alternate Level Stairs-Number of Steps: 12 Alternate Level Stairs-Rails: Right Bathroom Shower/Tub: Chief Strategy Officer: Standard     Home Equipment: None          Prior Functioning/Environment Prior Level of Function : Driving;Working/employed;Independent/Modified Independent (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)               ADLs Comments: indep    OT Problem List: Decreased strength;Decreased activity tolerance;Impaired balance (sitting and/or standing);Decreased safety awareness   OT Treatment/Interventions: Self-care/ADL training;Energy conservation;DME and/or AE instruction;Therapeutic activities;Patient/family education;Balance training      OT Goals(Current goals can be  found in the care plan section)   Acute Rehab OT Goals Patient Stated Goal: Feel better OT Goal Formulation: With patient Time For Goal Achievement: 11/11/24 Potential to Achieve Goals: Good ADL Goals Pt Will Perform Grooming: Independently Pt Will Perform Lower Body Dressing: Independently Pt Will Transfer to Toilet: Independently;ambulating;regular height toilet Pt Will Perform Toileting - Clothing Manipulation and hygiene: Independently;sit to/from stand   OT Frequency:  Min 1X/week    Co-evaluation              AM-PAC OT 6 Clicks Daily Activity     Outcome Measure Help from another person eating meals?: None Help from another person taking care of personal grooming?: None Help from another person toileting, which includes using toliet, bedpan, or urinal?: None Help from another person bathing (including washing, rinsing, drying)?: A Little Help from another person to put on and taking off regular upper body clothing?: None Help from another person to put on and taking off regular lower body clothing?: None 6 Click Score: 23   End of Session Equipment Utilized During Treatment: Other (comment) (IV pole) Nurse Communication: Mobility status  Activity Tolerance: Patient tolerated treatment well Patient left: in bed;with call bell/phone within reach;with bed alarm set;with family/visitor present  OT Visit Diagnosis: Unsteadiness on feet (R26.81);Other abnormalities of gait and mobility (R26.89);Muscle weakness (generalized) (M62.81)  Time: 8973-8957 OT Time Calculation (min): 16 min Charges:  OT General Charges $OT Visit: 1 Visit OT Evaluation $OT Eval Low Complexity: 1 Low  Larraine Colas M.S. OTR/L  10/28/24, 11:37 AM

## 2024-10-28 NOTE — TOC Initial Note (Signed)
 Transition of Care [facility]) - Initial/Assessment Note    Patient Details  Name: Shelley Morrison MRN: [id number] Date of Birth: November 26, 1966  Transition of Care [facility]) CM/SW Contact:    Alfonse JONELLE Rex, RN Phone Number: 10/28/2024, 2:50 PM  Clinical Narrative:     Met with patient at bedside to introduce role of TOC/NCM and review for dc planning, patient confirmed she has an established PCP, no current home care services or home DME, reports she lives alone,  is independent with self care and functional mobility.  PT eval pending, await recommendation. IP Care Mgmt will continue to follow.              Expected Discharge Plan: Home/Self Care Barriers to Discharge: Continued Medical Work up   Patient Goals and CMS Choice Patient states their goals for this hospitalization and ongoing recovery are:: return home          Expected Discharge Plan and Services       Living arrangements for the past 2 months: Single Family Home                                      Prior Living Arrangements/Services Living arrangements for the past 2 months: Single Family Home Lives with:: Self Patient language and need for interpreter reviewed:: Yes Do you feel safe going back to the place where you live?: Yes        Care giver support system in place?: Yes (comment)   Criminal Activity/Legal Involvement Pertinent to Current Situation/Hospitalization: No - Comment as needed  Activities of Daily Living   ADL Screening (condition at time of admission) Independently performs ADLs?: Yes (appropriate for developmental age) Is the patient deaf or have difficulty hearing?: No Does the patient have difficulty seeing, even when wearing glasses/contacts?: No Does the patient have difficulty concentrating, remembering, or making decisions?: No  Permission Sought/Granted                  Emotional Assessment Appearance:: Appears stated age Attitude/Demeanor/Rapport: Engaged Affect  (typically observed): Accepting Orientation: : Oriented to Self, Oriented to Place, Oriented to  Time, Oriented to Situation Alcohol / Substance Use: Not Applicable Psych Involvement: No (comment)  Admission diagnosis:  Dehydration [E86.0] AKI (acute kidney injury) [N17.9] Patient Active Problem List   Diagnosis Date Noted   Dehydration 10/28/2024   Generalized weakness 10/28/2024   Intractable nausea and vomiting 10/28/2024   History of systemic lupus erythematosus (HCC) 10/28/2024   History of rheumatoid arthritis 10/28/2024   AKI (acute kidney injury) 10/27/2024   Uterine fibroid 04/30/2023   Rheumatoid arthritis with rheumatoid factor of multiple sites without organ or systems involvement (HCC) 06/19/2017   High risk medication use 06/18/2017   Vitamin D  deficiency 06/18/2017   Primary insomnia 05/19/2017   Essential hypertension 05/19/2017   ANA positive 05/08/2017   Rheumatoid factor positive 05/08/2017   PCP:  Dyane Anthony RAMAN, FNP Pharmacy:   [facility] - [place], [place] - [address] [place] [address] Phone: [phone] Fax: [phone]     Social Drivers of Health (SDOH) Social History: SDOH Screenings   Food Insecurity: No Food Insecurity (10/28/2024)  Housing: Low Risk  (10/28/2024)  Transportation Needs: No Transportation Needs (10/28/2024)  Utilities: Not At Risk (10/28/2024)  Depression (PHQ2-9): Low Risk  (01/23/2023)  Tobacco Use: Medium Risk (10/27/2024)   SDOH Interventions:  Readmission Risk Interventions     No data to display

## 2024-10-29 DIAGNOSIS — N179 Acute kidney failure, unspecified: Secondary | ICD-10-CM | POA: Diagnosis not present

## 2024-10-29 LAB — CBC
HCT: 33.9 % — ABNORMAL LOW (ref 36.0–46.0)
Hemoglobin: 10.6 g/dL — ABNORMAL LOW (ref 12.0–15.0)
MCH: 29 pg (ref 26.0–34.0)
MCHC: 31.3 g/dL (ref 30.0–36.0)
MCV: 92.9 fL (ref 80.0–100.0)
Platelets: 171 K/uL (ref 150–400)
RBC: 3.65 MIL/uL — ABNORMAL LOW (ref 3.87–5.11)
RDW: 13.3 % (ref 11.5–15.5)
WBC: 2.1 K/uL — ABNORMAL LOW (ref 4.0–10.5)
nRBC: 0 % (ref 0.0–0.2)

## 2024-10-29 LAB — BASIC METABOLIC PANEL WITH GFR
Anion gap: 7 (ref 5–15)
BUN: 12 mg/dL (ref 6–20)
CO2: 20 mmol/L — ABNORMAL LOW (ref 22–32)
Calcium: 8.6 mg/dL — ABNORMAL LOW (ref 8.9–10.3)
Chloride: 110 mmol/L (ref 98–111)
Creatinine, Ser: 0.86 mg/dL (ref 0.44–1.00)
GFR, Estimated: >60 mL/min (ref >60)
Glucose, Bld: 82 mg/dL (ref 70–99)
Potassium: 4 mmol/L (ref 3.5–5.1)
Sodium: 137 mmol/L (ref 135–145)

## 2024-10-29 LAB — GASTROINTESTINAL PANEL BY PCR, STOOL (REPLACES STOOL CULTURE)

## 2024-10-29 LAB — GLUCOSE, CAPILLARY: Glucose-Capillary: 73 mg/dL (ref 70–99)

## 2024-10-29 LAB — MAGNESIUM: Magnesium: 2.2 mg/dL (ref 1.7–2.4)

## 2024-10-29 MED ORDER — ENSURE PLUS HIGH PROTEIN PO LIQD: 237.0000 mL | Freq: Two times a day (BID) | ORAL | Status: AC

## 2024-10-29 MED ORDER — ACETAMINOPHEN 500 MG PO TABS: 500.0000 mg | ORAL_TABLET | Freq: Four times a day (QID) | ORAL | Status: DC | PRN

## 2024-10-29 NOTE — Discharge Summary (Signed)
 Physician Discharge Summary  Shelley Morrison FMW:[id number] DOB: 1966/12/16 DOA: 10/27/2024  PCP: Dyane Anthony RAMAN, FNP  Admit date: 10/27/2024 Discharge date: 10/29/2024  Admitted From: Home  Discharge disposition: Home   Recommendations for Outpatient Follow-Up:   Follow up with your primary care provider in one week.  Check CBC, BMP, magnesium in the next visit  Discharge Diagnosis:   Principal Problem:   AKI (acute kidney injury) Active Problems:   Dehydration   Generalized weakness   Intractable nausea and vomiting   History of systemic lupus erythematosus (HCC)   History of rheumatoid arthritis   Discharge Condition: Improved.  Diet recommendation: .  Carbohydrate-modified.  Increase hydration  Wound care: None.  Code status: Full.   History of Present Illness:   Shelley Morrison is a 58 y.o. female with medical history significant for rheumatoid arthritis, HTN, lupus, fatty liver and diabetes mellitus type 2 presented to hospital with nausea, vomiting and diarrhea.  Of note patient was started on Rybelsus about 3 months ago for weight loss.  Over the last 2 weeks, she has felt dehydrated and weak and 3 days prior to presentation she started having  persistent nausea and vomiting and loose stools with inability to keep any food or water  down.  Patient was then seen by her PCP and advised to come to the ED for further evaluation.  In the ED patient had temperature max of 99.4 F had mild tachycardia and tachypnea.  Labs showed sodium of 133, creatinine of 1.5.  CT abdomen and pelvis with no acute intraabdominal/pelvic pathology.  Pt received IV NS 1 L bolus, IV Zofran  and IV Protonix and was considered for admission to the hospital for further evaluation and treatment.   Hospital Course:   Following conditions were addressed during hospitalization as listed below,  Nausea, vomiting diarrhea.  Could be gastroenteritis versus semaglutide induced.  No further episodes of  nausea vomiting or diarrhea.  GI pathogen panel could not be obtained due to lack of sample.  Received IV hydration during hospitalization and has tolerated oral diet..    Acute kidney injury. Creatinine on presentation at 1.5 from baseline around 1.0.  Creatinine today at 0.8.Shelley Morrison  Received IV hydration.  CT A/P without contrast does not show any acute intra-abdominal or pelvic pathology.   Diabetes mellitus type 2.  hemoglobin A1c of 5.7.  Encouraged diabetic diet on discharge.  Not on medications at home.   Essential hypertension Supposed to be on losartan but not taking at home.  She will discuss this with her PCP if needed.   Rheumatoid arthritis/Lupus - Continue sulfasalazine .  Follow-up with rheumatology in the outpatient   History of Vitamin D  deficiency Vitamin D  level at 30.8.   Generalized weakness Likely secondary to severe volume depletion.  No special therapy needs.   Disposition.  At this time, patient is stable for disposition home with outpatient PCP follow-up.  Medical Consultants:   None.  Procedures:    None Subjective:   Today, patient was seen and examined at bedside.  Denies any nausea, vomiting abdominal pain or diarrhea.  Has tolerated oral diet.  Discharge Exam:   Vitals:   10/28/24 2020 10/29/24 0452  BP: 131/82 113/73  Pulse: 98 77  Resp: 19 17  Temp: 99.9 F (37.7 C) 99 F (37.2 C)  SpO2: 93% 100%   Vitals:   10/28/24 0936 10/28/24 1326 10/28/24 2020 10/29/24 0452  BP: 116/73 110/75 131/82 113/73  Pulse: 86 82 98 77  Resp: 18 16 19 17   Temp:  98.9 F (37.2 C) 99.9 F (37.7 C) 99 F (37.2 C)  TempSrc:   Oral Oral  SpO2: 93% 98% 93% 100%  Weight:      Height:       Body mass index is 25.54 kg/m.   General: Alert awake, not in obvious distress HENT: pupils equally reacting to light,  No scleral pallor or icterus noted. Oral mucosa is moist.  Chest:  Clear breath sounds.   CVS: S1 &S2 heard. No murmur.  Regular rate and  rhythm. Abdomen: Soft, nontender, nondistended.  Bowel sounds are heard.   Extremities: No cyanosis, clubbing or edema.  Peripheral pulses are palpable. Psych: Alert, awake and oriented, normal mood CNS:  No cranial nerve deficits.  Power equal in all extremities.   Skin: Warm and dry.  No rashes noted.  The results of significant diagnostics from this hospitalization (including imaging, microbiology, ancillary and laboratory) are listed below for reference.     Diagnostic Studies:   CT ABDOMEN PELVIS WO CONTRAST Result Date: 10/27/2024 EXAM: CT ABDOMEN AND PELVIS WITHOUT CONTRAST 10/27/2024 09:58:33 PM TECHNIQUE: CT of the abdomen and pelvis was performed without the administration of intravenous contrast. Multiplanar reformatted images are provided for review. Automated exposure control, iterative reconstruction, and/or weight-based adjustment of the mA/kV was utilized to reduce the radiation dose to as low as reasonably achievable. COMPARISON: CT abdomen and pelvis from 01/22/2023. CLINICAL HISTORY: Abdominal pain, acute, nonlocalized. FINDINGS: LOWER CHEST: No acute abnormality. LIVER: The liver is unremarkable. GALLBLADDER AND BILE DUCTS: Status post cholecystectomy. No biliary ductal dilatation. SPLEEN: No acute abnormality. PANCREAS: No acute abnormality. ADRENAL GLANDS: No acute abnormality. KIDNEYS, URETERS AND BLADDER: No stones in the kidneys or ureters. No hydronephrosis. No perinephric or periureteral stranding. Urinary bladder is unremarkable. GI AND BOWEL: Stomach demonstrates no acute abnormality. No small or large bowel thickening or dilatation. There is no bowel obstruction. Unremarkable appendix. PERITONEUM AND RETROPERITONEUM: No ascites. No free air. VASCULATURE: Aorta is normal in caliber. Moderate atherosclerotic plaque. LYMPH NODES: Prominent but non-enlarged meesenteric lyumph nodes along the small bowel vasculature. No lymphadenopathy. REPRODUCTIVE ORGANS: Status post  hysterectomy. No adnexal mass. BONES AND SOFT TISSUES: No acute osseous abnormality. No focal soft tissue abnormality. IMPRESSION: 1. No acute findings in the abdomen or pelvis with limited evaluation on this noncontrast study . 2. Status post cholecystectomy and hysterectomy . Electronically signed by: Kate Plummer MD 10/27/2024 10:04 PM EDT RP Workstation: [id number]     Labs:   Basic Metabolic Panel: Recent Labs  Lab 10/27/24 1933 10/28/24 0336 10/29/24 0324  NA 133* 137 137  K 4.2 4.1 4.0  CL 98 105 110  CO2 24 22 20*  GLUCOSE 93 77 82  BUN 31* 26* 12  CREATININE 1.52* 1.23* 0.86  CALCIUM 9.7 8.5* 8.6*  MG  --  2.3 2.2  PHOS  --  3.4  --    GFR Estimated Creatinine Clearance: 78 mL/min (by C-G formula based on SCr of 0.86 mg/dL). Liver Function Tests: Recent Labs  Lab 10/28/24 0336  AST 140*  ALT 98*  ALKPHOS 62  BILITOT 0.5  PROT 7.0  ALBUMIN 3.2*   Recent Labs  Lab 10/27/24 2300  LIPASE 122*   No results for input(s): AMMONIA in the last 168 hours. Coagulation profile No results for input(s): INR, PROTIME in the last 168 hours.  CBC: Recent Labs  Lab 10/27/24 1933 10/28/24 0336 10/29/24 0324  WBC 3.0* 2.7* 2.1*  HGB 13.2 11.4* 10.6*  HCT 42.5 37.2 33.9*  MCV 89.5 90.3 92.9  PLT 225 183 171   Cardiac Enzymes: No results for input(s): CKTOTAL, CKMB, CKMBINDEX, TROPONINI in the last 168 hours. BNP: Invalid input(s): POCBNP CBG: Recent Labs  Lab 10/28/24 0817 10/28/24 1154 10/28/24 1652 10/28/24 2112 10/29/24 0745  GLUCAP 80 89 76 87 73   D-Dimer No results for input(s): DDIMER in the last 72 hours. Hgb A1c Recent Labs    10/28/24 0336  HGBA1C 5.7*   Lipid Profile No results for input(s): CHOL, HDL, LDLCALC, TRIG, CHOLHDL, LDLDIRECT in the last 72 hours. Thyroid function studies No results for input(s): TSH, T4TOTAL, T3FREE, THYROIDAB in the last 72 hours.  Invalid input(s): FREET3 Anemia  work up No results for input(s): VITAMINB12, FOLATE, FERRITIN, TIBC, IRON, RETICCTPCT in the last 72 hours. Microbiology Recent Results (from the past 240 hours)  Resp panel by RT-PCR (RSV, Flu A&B, Covid) Anterior Nasal Swab     Status: None   Collection Time: 10/20/24  8:42 PM   Specimen: Anterior Nasal Swab  Result Value Ref Range Status   SARS Coronavirus 2 by RT PCR NEGATIVE NEGATIVE Final    Comment: (NOTE) SARS-CoV-2 target nucleic acids are NOT DETECTED.  The SARS-CoV-2 RNA is generally detectable in upper respiratory specimens during the acute phase of infection. The lowest concentration of SARS-CoV-2 viral copies this assay can detect is 138 copies/mL. A negative result does not preclude SARS-Cov-2 infection and should not be used as the sole basis for treatment or other patient management decisions. A negative result may occur with  improper specimen collection/handling, submission of specimen other than nasopharyngeal swab, presence of viral mutation(s) within the areas targeted by this assay, and inadequate number of viral copies(<138 copies/mL). A negative result must be combined with clinical observations, patient history, and epidemiological information. The expected result is Negative.  Fact Sheet for Patients:  bloggercourse.com  Fact Sheet for Healthcare Providers:  seriousbroker.it  This test is no t yet approved or cleared by the [place]  FDA and  has been authorized for detection and/or diagnosis of SARS-CoV-2 by FDA under an Emergency Use Authorization (EUA). This EUA will remain  in effect (meaning this test can be used) for the duration of the COVID-19 declaration under Section 564(b)(1) of the Act, 21 U.S.C.section 360bbb-3(b)(1), unless the authorization is terminated  or revoked sooner.       Influenza A by PCR NEGATIVE NEGATIVE Final   Influenza B by PCR NEGATIVE NEGATIVE Final     Comment: (NOTE) The Xpert Xpress SARS-CoV-2/FLU/RSV plus assay is intended as an aid in the diagnosis of influenza from Nasopharyngeal swab specimens and should not be used as a sole basis for treatment. Nasal washings and aspirates are unacceptable for Xpert Xpress SARS-CoV-2/FLU/RSV testing.  Fact Sheet for Patients: bloggercourse.com  Fact Sheet for Healthcare Providers: seriousbroker.it  This test is not yet approved or cleared by the [place]  FDA and has been authorized for detection and/or diagnosis of SARS-CoV-2 by FDA under an Emergency Use Authorization (EUA). This EUA will remain in effect (meaning this test can be used) for the duration of the COVID-19 declaration under Section 564(b)(1) of the Act, 21 U.S.C. section 360bbb-3(b)(1), unless the authorization is terminated or revoked.     Resp Syncytial Virus by PCR NEGATIVE NEGATIVE Final    Comment: (NOTE) Fact Sheet for Patients: bloggercourse.com  Fact Sheet for Healthcare Providers: seriousbroker.it  This test is not yet approved or cleared by  the [place]  FDA and has been authorized for detection and/or diagnosis of SARS-CoV-2 by FDA under an Emergency Use Authorization (EUA). This EUA will remain in effect (meaning this test can be used) for the duration of the COVID-19 declaration under Section 564(b)(1) of the Act, 21 U.S.C. section 360bbb-3(b)(1), unless the authorization is terminated or revoked.  Performed at [facility], [address] [place], [place] [address]   Gastrointestinal Panel by PCR , Stool     Status: None   Collection Time: 10/28/24 10:59 AM   Specimen: Stool  Result Value Ref Range Status   Campylobacter species NOT DETECTED NOT DETECTED Final   Plesimonas shigelloides NOT DETECTED NOT DETECTED Final   Salmonella species NOT DETECTED NOT DETECTED Final    Yersinia enterocolitica NOT DETECTED NOT DETECTED Final   Vibrio species NOT DETECTED NOT DETECTED Final   Vibrio cholerae NOT DETECTED NOT DETECTED Final   Enteroaggregative E coli (EAEC) NOT DETECTED NOT DETECTED Final   Enteropathogenic E coli (EPEC) NOT DETECTED NOT DETECTED Final   Enterotoxigenic E coli (ETEC) NOT DETECTED NOT DETECTED Final   Shiga like toxin producing E coli (STEC) NOT DETECTED NOT DETECTED Final   Shigella/Enteroinvasive E coli (EIEC) NOT DETECTED NOT DETECTED Final   Cryptosporidium NOT DETECTED NOT DETECTED Final   Cyclospora cayetanensis NOT DETECTED NOT DETECTED Final   Entamoeba histolytica NOT DETECTED NOT DETECTED Final   Giardia lamblia NOT DETECTED NOT DETECTED Final   Adenovirus F40/41 NOT DETECTED NOT DETECTED Final   Astrovirus NOT DETECTED NOT DETECTED Final   Norovirus GI/GII NOT DETECTED NOT DETECTED Final   Rotavirus A NOT DETECTED NOT DETECTED Final   Sapovirus (I, II, IV, and V) NOT DETECTED NOT DETECTED Final    Comment: Performed at [facility], [address], [place], [place] [address]     Discharge Instructions:   Discharge Instructions     Call MD for:  persistant nausea and vomiting   Complete by: As directed    Call MD for:  severe uncontrolled pain   Complete by: As directed    Diet Carb Modified   Complete by: As directed    Discharge instructions   Complete by: As directed    Follow up with your primary care provider in 1 week.  Check blood work at that time.  Seek medical attention for worsening symptoms.  Increase hydration.   Increase activity slowly   Complete by: As directed       Allergies as of 10/29/2024       Reactions   Acetaminophen -codeine Other (See Comments)   Causes flank pain per patient..        Medication List     STOP taking these medications    cyclobenzaprine  10 MG tablet Commonly known as: FLEXERIL    losartan 50 MG tablet Commonly known as: COZAAR   PROBIOTIC PO    Rybelsus 7 MG Tabs Generic drug: Semaglutide   STOOL SOFTENER PO   TURMERIC PO       TAKE these medications    acetaminophen  500 MG tablet Commonly known as: TYLENOL  Take 1 tablet (500 mg total) by mouth every 6 (six) hours as needed for mild pain (pain score 1-3) or fever. What changed:  how much to take how to take this when to take this reasons to take this additional instructions   atenolol 25 MG tablet Commonly known as: TENORMIN Take 25 mg by mouth daily as needed (pulse rate over 100).  feeding supplement Liqd Take 237 mLs by mouth 2 (two) times daily between meals.   sulfaSALAzine  500 MG tablet Commonly known as: AZULFIDINE  Take 2 tablets (1,000 mg total) by mouth 2 (two) times daily.   traZODone 100 MG tablet Commonly known as: DESYREL Take 100 mg by mouth at bedtime.   Vitamin D  50 MCG (2000 UT) Caps Take 1 capsule by mouth daily.        Follow-up Information     Dyane Anthony RAMAN, FNP Follow up in 1 week(s).   Specialty: Family Medicine Contact information: [address] [place] [address] [phone]                  Time coordinating discharge: 39 minutes  Signed:  Grizelda Piscopo  [facility] 10/29/2024, 2:13 PM

## 2024-12-20 ENCOUNTER — Emergency Department (HOSPITAL_BASED_OUTPATIENT_CLINIC_OR_DEPARTMENT_OTHER)
Admission: EM | Admit: 2024-12-20 | Discharge: 2024-12-20 | Disposition: A | Discharge status: Home / Self Care | Source: Home / Self Care | Attending: Emergency Medicine | Admitting: Emergency Medicine

## 2024-12-20 ENCOUNTER — Emergency Department (HOSPITAL_BASED_OUTPATIENT_CLINIC_OR_DEPARTMENT_OTHER): Admitting: Radiology

## 2024-12-20 ENCOUNTER — Other Ambulatory Visit: Payer: Self-pay

## 2024-12-20 ENCOUNTER — Encounter (HOSPITAL_BASED_OUTPATIENT_CLINIC_OR_DEPARTMENT_OTHER): Payer: Self-pay | Admitting: Emergency Medicine

## 2024-12-20 DIAGNOSIS — M25551 Pain in right hip: Secondary | ICD-10-CM

## 2024-12-20 MED ORDER — KETOROLAC TROMETHAMINE 30 MG/ML IJ SOLN
30.0000 mg | Freq: Once | INTRAMUSCULAR | Status: AC
  Administered 2024-12-20: 30 mg via INTRAMUSCULAR
  Filled 2024-12-20: qty 1

## 2024-12-20 MED ORDER — OXYCODONE-ACETAMINOPHEN 5-325 MG PO TABS: 1.0000 | ORAL_TABLET | Freq: Four times a day (QID) | ORAL | 0 refills | Status: DC | PRN

## 2024-12-20 MED ORDER — LIDOCAINE 5 % EX PTCH: 1.0000 | MEDICATED_PATCH | CUTANEOUS | 0 refills | Status: AC

## 2024-12-20 MED ORDER — METHOCARBAMOL 500 MG PO TABS: 500.0000 mg | ORAL_TABLET | Freq: Two times a day (BID) | ORAL | 0 refills | Status: DC

## 2024-12-20 MED ORDER — MORPHINE SULFATE (PF) 4 MG/ML IV SOLN
4.0000 mg | Freq: Once | INTRAVENOUS | Status: AC
  Administered 2024-12-20: 4 mg via INTRAMUSCULAR
  Filled 2024-12-20: qty 1

## 2024-12-20 MED ORDER — HYDROCODONE-ACETAMINOPHEN 5-325 MG PO TABS
1.0000 | ORAL_TABLET | Freq: Once | ORAL | Status: AC
  Administered 2024-12-20: 1 via ORAL
  Filled 2024-12-20: qty 1

## 2024-12-20 NOTE — Discharge Instructions (Addendum)
 It was a pleasure taking care of you here today.  As we discussed your x-ray was reassuring.  Given your persistent pain I recommend a CT scan which you declined.  You are given crutches in the emergency department and I have written for pain medication and muscle relaxer.  Please use caution as the muscle relaxer and pain medication may make you sleepy.  Do not take at the same time.  Follow-up with orthopedic  Return for new or worsening symptoms

## 2024-12-20 NOTE — ED Provider Notes (Signed)
 " [place] EMERGENCY DEPARTMENT AT [facility] Provider Note   CSN: [id number] Arrival date & time: 12/20/24  1415     Patient presents with: Hip Pain   Shelley Morrison is a 58 y.o. female here for evaluation of right hip pain.  States around 11:00 today she stood and went to walk and her right hip locked up.  Has been aching since.  Worse with movement.  She denies any trauma, falls to the ground.  No numbness or weakness.  No back pain, leg swelling.  Has never had anything like this previously.   HPI     Prior to Admission medications  Medication Sig Start Date End Date Taking? Authorizing Provider  lidocaine  (LIDODERM ) 5 % Place 1 patch onto the skin daily. Remove & Discard patch within 12 hours or as directed by MD 12/20/24  Yes Beena Catano A, PA-C  methocarbamol  (ROBAXIN ) 500 MG tablet Take 1 tablet (500 mg total) by mouth 2 (two) times daily. 12/20/24  Yes Sunita Demond A, PA-C  oxyCODONE -acetaminophen  (PERCOCET/ROXICET) 5-325 MG tablet Take 1 tablet by mouth every 6 (six) hours as needed for severe pain (pain score 7-10). 12/20/24  Yes Yailen Zemaitis A, PA-C  acetaminophen  (TYLENOL ) 500 MG tablet Take 1 tablet (500 mg total) by mouth every 6 (six) hours as needed for mild pain (pain score 1-3) or fever. 10/29/24   Pokhrel, Laxman, MD  Cholecalciferol (VITAMIN D ) 50 MCG (2000 UT) CAPS Take 1 capsule by mouth daily.    [provider]  feeding supplement (ENSURE PLUS HIGH PROTEIN) LIQD Take 237 mLs by mouth 2 (two) times daily between meals. 10/29/24   Pokhrel, Laxman, MD    Allergies: Acetaminophen -codeine    Review of Systems  Constitutional: Negative.   HENT: Negative.    Respiratory: Negative.    Cardiovascular: Negative.   Gastrointestinal: Negative.   Genitourinary: Negative.   Musculoskeletal:        Right hip pain  Neurological: Negative.   All other systems reviewed and are negative.   Updated Vital Signs BP (!) 167/89   Pulse 89    Temp 98 F (36.7 C)   Resp 15   Ht 5' 10 (1.778 m)   Wt 81.6 kg   LMP 09/14/2017   SpO2 100%   BMI 25.83 kg/m   Physical Exam Vitals and nursing note reviewed.  Constitutional:      General: She is not in acute distress.    Appearance: She is well-developed. She is not ill-appearing, toxic-appearing or diaphoretic.  HENT:     Head: Atraumatic.  Eyes:     Pupils: Pupils are equal, round, and reactive to light.  Cardiovascular:     Rate and Rhythm: Normal rate.     Pulses: Normal pulses.          Dorsalis pedis pulses are 2+ on the right side and 2+ on the left side.     Heart sounds: Normal heart sounds.  Pulmonary:     Effort: No respiratory distress.  Abdominal:     General: Bowel sounds are normal. There is no distension.     Palpations: Abdomen is soft.     Tenderness: There is no abdominal tenderness.  Musculoskeletal:        General: Normal range of motion.     Cervical back: Normal range of motion.     Thoracic back: Normal.     Lumbar back: Normal.     Right hip: Tenderness and bony tenderness  present.     Right upper leg: Normal.     Left upper leg: Normal.     Right knee: Normal.     Left knee: Normal.     Right lower leg: Normal.     Left lower leg: Normal.     Right ankle: Normal.     Left ankle: Normal.     Right foot: Normal.     Left foot: Normal.       Legs:     Comments: Tenderness lateral aspect right hip and right piriformis.  Pelvis stable, nontender.  No bony tenderness midshaft, distal femur, tib-fib, foot.  No midline spinal tenderness.  No shortening or rotation of legs.  Able to straight leg raise right leg however with pain at right hip.  Skin:    General: Skin is warm and dry.     Capillary Refill: Capillary refill takes less than 2 seconds.     Comments: No edema, erythema, warmth, joint effusion  Neurological:     General: No focal deficit present.     Mental Status: She is alert.     Cranial Nerves: Cranial nerves 2-12 are  intact.     Sensory: Sensation is intact.     Motor: Motor function is intact.     Comments: Limping gait on right  Psychiatric:        Mood and Affect: Mood normal.     (all labs ordered are listed, but only abnormal results are displayed) Labs Reviewed - No data to display  EKG: None  Radiology: DG Hip Unilat W or Wo Pelvis 2-3 Views Right Result Date: 12/20/2024 CLINICAL DATA:  Right hip pain. EXAM: DG HIP (WITH OR WITHOUT PELVIS) 2-3V RIGHT COMPARISON:  None Available. FINDINGS: No acute fracture or dislocation. Mild bilateral hip arthritic changes. The soft tissues are unremarkable. IMPRESSION: 1. No acute fracture or dislocation. 2. Mild bilateral hip arthritic changes. Electronically Signed   By: Vanetta Chou M.D.   On: 12/20/2024 17:38     Procedures   Medications Ordered in the ED  HYDROcodone -acetaminophen  (NORCO/VICODIN) 5-325 MG per tablet 1 tablet (1 tablet Oral Given 12/20/24 1552)  ketorolac  (TORADOL ) 30 MG/ML injection 30 mg (30 mg Intramuscular Given 12/20/24 1552)  morphine  (PF) 4 MG/ML injection 4 mg (4 mg Intramuscular Given 12/20/24 4370)   58 year old here for evaluation of right hip pain, atraumatic.  Started after she stood up to walk around 11 AM today.  Pain does not radiate.  Pain to right lateral aspect hip and over piriformis.  No shortening rotation of legs.  Here she is neurovascularly intact, low suspicion for VTE, ischemia.  No overlying skin changes, no obvious joint effusion, edema, erythema, warmth-to suggest septic joint, gout, hemarthrosis, cellulitis, necrotizing infection.  No midline back pain to suggest radiculopathy.  Will plan on imaging, pain control, reassess.  Imaging personally viewed interpreted: Xray right hip wo acute abnormality  Patient reassessed.  Did need some assistance to ambulate to the bathroom by family members.  Discussed reassuring x-ray however given persistent pain I recommend CT scan to rule out occult fracture,  other etiology of her pain.  Patient declines additional imaging at this time.  Would like to go home.  Discussed missed emergent pathology which she voiced understanding of risk versus benefit.  Patient given crutches here, pain management.  She is to follow-up with orthopedics outpatient, return for any worsening symptoms  Low suspicion for VTE, ischemia, bacterial infectious process, dissection, septic joint, gout, hemarthrosis,  occult fracture.  The patient has been appropriately medically screened and/or stabilized in the ED. I have low suspicion for any other emergent medical condition which would require further screening, evaluation or treatment in the ED or require inpatient management.  Patient is hemodynamically stable and in no acute distress.  Patient able to ambulate in department prior to ED.  Evaluation does not show acute pathology that would require ongoing or additional emergent interventions while in the emergency department or further inpatient treatment.  I have discussed the diagnosis with the patient and answered all questions.  Pain is been managed while in the emergency department and patient has no further complaints prior to discharge.  Patient is comfortable with plan discussed in room and is stable for discharge at this time.  I have discussed strict return precautions for returning to the emergency department.  Patient was encouraged to follow-up with PCP/specialist refer to at discharge.                                    Medical Decision Making Amount and/or Complexity of Data Reviewed Independent Historian: friend External Data Reviewed: labs, radiology and notes. Radiology: ordered and independent interpretation performed. Decision-making details documented in ED Course.  Risk OTC drugs. Prescription drug management. Decision regarding hospitalization. Diagnosis or treatment significantly limited by social determinants of health.        Final diagnoses:   Right hip pain    ED Discharge Orders          Ordered    oxyCODONE -acetaminophen  (PERCOCET/ROXICET) 5-325 MG tablet  Every 6 hours PRN        12/20/24 1820    lidocaine  (LIDODERM ) 5 %  Every 24 hours        12/20/24 1820    methocarbamol  (ROBAXIN ) 500 MG tablet  2 times daily        12/20/24 1820               Coreena Rubalcava A, PA-C 12/20/24 1836    Pamella Ozell LABOR, DO 12/25/24 1946  "

## 2024-12-20 NOTE — ED Triage Notes (Addendum)
 Pt caox4 c/o R hip pain since approx 1100, pt stated it locked up and has been hurting since. Denies any trauma or injury. Took Tylenol  just after pain started.

## 2024-12-20 NOTE — ED Notes (Signed)
 Patient ambulated to restroom with assistance from both family members prior to morphine  administration. Provider notified.

## 2024-12-21 ENCOUNTER — Inpatient Hospital Stay (HOSPITAL_COMMUNITY)
Admission: EM | Admit: 2024-12-21 | Discharge: 2024-12-26 | DRG: 554 | Disposition: A | Discharge status: Home Health | Attending: Internal Medicine | Admitting: Internal Medicine

## 2024-12-21 ENCOUNTER — Other Ambulatory Visit: Payer: Self-pay

## 2024-12-21 ENCOUNTER — Emergency Department (HOSPITAL_COMMUNITY)

## 2024-12-21 DIAGNOSIS — E663 Overweight: Secondary | ICD-10-CM | POA: Diagnosis present

## 2024-12-21 DIAGNOSIS — Z6826 Body mass index (BMI) 26.0-26.9, adult: Secondary | ICD-10-CM

## 2024-12-21 DIAGNOSIS — Z860101 Personal history of adenomatous and serrated colon polyps: Secondary | ICD-10-CM

## 2024-12-21 DIAGNOSIS — M329 Systemic lupus erythematosus, unspecified: Secondary | ICD-10-CM | POA: Diagnosis present

## 2024-12-21 DIAGNOSIS — Z79899 Other long term (current) drug therapy: Secondary | ICD-10-CM

## 2024-12-21 DIAGNOSIS — R52 Pain, unspecified: Secondary | ICD-10-CM | POA: Diagnosis present

## 2024-12-21 DIAGNOSIS — R531 Weakness: Secondary | ICD-10-CM

## 2024-12-21 DIAGNOSIS — E559 Vitamin D deficiency, unspecified: Secondary | ICD-10-CM | POA: Diagnosis present

## 2024-12-21 DIAGNOSIS — K760 Fatty (change of) liver, not elsewhere classified: Secondary | ICD-10-CM | POA: Diagnosis present

## 2024-12-21 DIAGNOSIS — M0579 Rheumatoid arthritis with rheumatoid factor of multiple sites without organ or systems involvement: Secondary | ICD-10-CM | POA: Diagnosis present

## 2024-12-21 DIAGNOSIS — F5101 Primary insomnia: Secondary | ICD-10-CM | POA: Diagnosis present

## 2024-12-21 DIAGNOSIS — K5900 Constipation, unspecified: Secondary | ICD-10-CM | POA: Diagnosis present

## 2024-12-21 DIAGNOSIS — M25551 Pain in right hip: Principal | ICD-10-CM

## 2024-12-21 DIAGNOSIS — G4700 Insomnia, unspecified: Secondary | ICD-10-CM | POA: Diagnosis present

## 2024-12-21 DIAGNOSIS — Z885 Allergy status to narcotic agent status: Secondary | ICD-10-CM

## 2024-12-21 DIAGNOSIS — Z1152 Encounter for screening for COVID-19: Secondary | ICD-10-CM

## 2024-12-21 DIAGNOSIS — Z9049 Acquired absence of other specified parts of digestive tract: Secondary | ICD-10-CM

## 2024-12-21 DIAGNOSIS — M160 Bilateral primary osteoarthritis of hip: Principal | ICD-10-CM | POA: Diagnosis present

## 2024-12-21 DIAGNOSIS — I10 Essential (primary) hypertension: Secondary | ICD-10-CM | POA: Diagnosis present

## 2024-12-21 DIAGNOSIS — E119 Type 2 diabetes mellitus without complications: Secondary | ICD-10-CM | POA: Diagnosis present

## 2024-12-21 DIAGNOSIS — M5136 Other intervertebral disc degeneration, lumbar region with discogenic back pain only: Secondary | ICD-10-CM | POA: Diagnosis present

## 2024-12-21 DIAGNOSIS — Z8739 Personal history of other diseases of the musculoskeletal system and connective tissue: Secondary | ICD-10-CM

## 2024-12-21 DIAGNOSIS — Z87891 Personal history of nicotine dependence: Secondary | ICD-10-CM

## 2024-12-21 DIAGNOSIS — Z90710 Acquired absence of both cervix and uterus: Secondary | ICD-10-CM

## 2024-12-21 DIAGNOSIS — D649 Anemia, unspecified: Secondary | ICD-10-CM | POA: Diagnosis present

## 2024-12-21 LAB — BASIC METABOLIC PANEL WITH GFR
Anion gap: 9 (ref 5–15)
BUN: 14 mg/dL (ref 6–20)
CO2: 22 mmol/L (ref 22–32)
Calcium: 9.4 mg/dL (ref 8.9–10.3)
Chloride: 106 mmol/L (ref 98–111)
Creatinine, Ser: 0.77 mg/dL (ref 0.44–1.00)
GFR, Estimated: >60 mL/min
Glucose, Bld: 123 mg/dL — ABNORMAL HIGH (ref 70–99)
Potassium: 3.8 mmol/L (ref 3.5–5.1)
Sodium: 136 mmol/L (ref 135–145)

## 2024-12-21 LAB — CBC
HCT: 36.7 % (ref 36.0–46.0)
Hemoglobin: 11.7 g/dL — ABNORMAL LOW (ref 12.0–15.0)
MCH: 29.7 pg (ref 26.0–34.0)
MCHC: 31.9 g/dL (ref 30.0–36.0)
MCV: 93.1 fL (ref 80.0–100.0)
Platelets: 217 K/uL (ref 150–400)
RBC: 3.94 MIL/uL (ref 3.87–5.11)
RDW: 13.3 % (ref 11.5–15.5)
WBC: 10.3 K/uL (ref 4.0–10.5)
nRBC: 0 % (ref 0.0–0.2)

## 2024-12-21 LAB — URINALYSIS, W/ REFLEX TO CULTURE (INFECTION SUSPECTED)
Bacteria, UA: NONE SEEN
Bilirubin Urine: NEGATIVE
Glucose, UA: NEGATIVE mg/dL
Hgb urine dipstick: NEGATIVE
Ketones, ur: NEGATIVE mg/dL
Leukocytes,Ua: NEGATIVE
Nitrite: NEGATIVE
Protein, ur: 100 mg/dL — AB
Specific Gravity, Urine: 1.019 (ref 1.005–1.030)
pH: 6 (ref 5.0–8.0)

## 2024-12-21 LAB — GLUCOSE, CAPILLARY: Glucose-Capillary: 190 mg/dL — ABNORMAL HIGH (ref 70–99)

## 2024-12-21 LAB — MAGNESIUM: Magnesium: 2 mg/dL (ref 1.7–2.4)

## 2024-12-21 LAB — SEDIMENTATION RATE: Sed Rate: 47 mm/h — ABNORMAL HIGH (ref 0–22)

## 2024-12-21 LAB — RESP PANEL BY RT-PCR (RSV, FLU A&B, COVID)  RVPGX2
Influenza A by PCR: NEGATIVE
Influenza B by PCR: NEGATIVE
Resp Syncytial Virus by PCR: NEGATIVE
SARS Coronavirus 2 by RT PCR: NEGATIVE

## 2024-12-21 LAB — C-REACTIVE PROTEIN: CRP: 6.2 mg/dL — ABNORMAL HIGH

## 2024-12-21 MED ORDER — SENNOSIDES-DOCUSATE SODIUM 8.6-50 MG PO TABS: 1.0000 | ORAL_TABLET | Freq: Every evening | ORAL | Status: DC | PRN

## 2024-12-21 MED ORDER — ONDANSETRON HCL 4 MG/2ML IJ SOLN
4.0000 mg | Freq: Four times a day (QID) | INTRAMUSCULAR | Status: DC | PRN
  Administered 2024-12-23 – 2024-12-25 (×2): 4 mg via INTRAVENOUS
  Filled 2024-12-21 (×3): qty 2

## 2024-12-21 MED ORDER — HYDROMORPHONE HCL 1 MG/ML IJ SOLN
0.5000 mg | Freq: Once | INTRAMUSCULAR | Status: AC
  Administered 2024-12-21: 0.5 mg via INTRAVENOUS
  Filled 2024-12-21: qty 1

## 2024-12-21 MED ORDER — KETOROLAC TROMETHAMINE 15 MG/ML IJ SOLN
15.0000 mg | Freq: Once | INTRAMUSCULAR | Status: AC
  Administered 2024-12-21: 15 mg via INTRAVENOUS
  Filled 2024-12-21: qty 1

## 2024-12-21 MED ORDER — INSULIN ASPART 100 UNIT/ML IJ SOLN: 0.0000 [IU] | Freq: Every day | INTRAMUSCULAR | Status: DC

## 2024-12-21 MED ORDER — ACETAMINOPHEN 650 MG RE SUPP: 650.0000 mg | Freq: Four times a day (QID) | RECTAL | Status: DC | PRN

## 2024-12-21 MED ORDER — HEPARIN SODIUM (PORCINE) 5000 UNIT/ML IJ SOLN
5000.0000 [IU] | Freq: Three times a day (TID) | INTRAMUSCULAR | Status: DC
  Administered 2024-12-21 – 2024-12-23 (×5): 5000 [IU] via SUBCUTANEOUS
  Filled 2024-12-21 (×5): qty 1

## 2024-12-21 MED ORDER — METHOCARBAMOL 1000 MG/10ML IJ SOLN
500.0000 mg | Freq: Four times a day (QID) | INTRAMUSCULAR | Status: DC | PRN
  Administered 2024-12-23 – 2024-12-25 (×2): 500 mg via INTRAVENOUS
  Filled 2024-12-21 (×2): qty 10

## 2024-12-21 MED ORDER — HYDROMORPHONE HCL 1 MG/ML IJ SOLN
0.5000 mg | INTRAMUSCULAR | Status: DC | PRN
  Administered 2024-12-21 – 2024-12-25 (×16): 1 mg via INTRAVENOUS
  Filled 2024-12-21 (×17): qty 1

## 2024-12-21 MED ORDER — LIDOCAINE 5 % EX PTCH
1.0000 | MEDICATED_PATCH | Freq: Once | CUTANEOUS | Status: AC
  Administered 2024-12-21: 1 via TRANSDERMAL
  Filled 2024-12-21: qty 1

## 2024-12-21 MED ORDER — SODIUM CHLORIDE 0.9% FLUSH
3.0000 mL | Freq: Two times a day (BID) | INTRAVENOUS | Status: DC
  Administered 2024-12-21 – 2024-12-22 (×3): 3 mL via INTRAVENOUS

## 2024-12-21 MED ORDER — INSULIN ASPART 100 UNIT/ML IJ SOLN: 0.0000 [IU] | Freq: Three times a day (TID) | INTRAMUSCULAR | Status: DC

## 2024-12-21 MED ORDER — ACETAMINOPHEN 325 MG PO TABS
650.0000 mg | ORAL_TABLET | Freq: Four times a day (QID) | ORAL | Status: DC | PRN
  Administered 2024-12-23 (×2): 650 mg via ORAL
  Filled 2024-12-21 (×2): qty 2

## 2024-12-21 MED ORDER — ACETAMINOPHEN 325 MG PO TABS
650.0000 mg | ORAL_TABLET | Freq: Four times a day (QID) | ORAL | Status: DC | PRN
  Administered 2024-12-21: 650 mg via ORAL
  Filled 2024-12-21: qty 2

## 2024-12-21 MED ORDER — OXYCODONE HCL 5 MG PO TABS
5.0000 mg | ORAL_TABLET | ORAL | Status: DC | PRN
  Administered 2024-12-22 – 2024-12-24 (×7): 5 mg via ORAL
  Filled 2024-12-21 (×7): qty 1

## 2024-12-21 MED ORDER — ONDANSETRON HCL 4 MG PO TABS: 4.0000 mg | ORAL_TABLET | Freq: Four times a day (QID) | ORAL | Status: DC | PRN

## 2024-12-21 MED ORDER — METHOCARBAMOL 500 MG PO TABS
500.0000 mg | ORAL_TABLET | Freq: Once | ORAL | Status: AC
  Administered 2024-12-21: 500 mg via ORAL
  Filled 2024-12-21: qty 1

## 2024-12-21 NOTE — ED Notes (Signed)
 Patient transported to CT

## 2024-12-21 NOTE — ED Provider Notes (Signed)
 Assumed care of this patient at signout.  In brief, 58 year old female history of hip pain that has been ongoing for last 2 days.  Symptoms started when the patient stood up to ambulate.  CT imaging showed bilateral effusions, right greater than left.  Was signed out pending sed rate as the patient did briefly have a fever here in the ED.  I evaluated the patient, she overall looks well.  No infectious symptoms at this time.  There is no redness or warmth over the hip, mild pain with active and passive range of motion.  Attempted to ambulate the patient again here in the ED and unfortunately she was not able to bear weight on that hip.  I have low suspicion for septic arthritis, however cannot reasonably excluded at this time.  Will admit patient for observation and pain control.  Discussed this with the hospitalist who is agreeable with admission.   Mannie Pac T, DO 12/21/24 1718

## 2024-12-21 NOTE — ED Triage Notes (Addendum)
 Pt BIB [facility], pt seen at [facility] yesterday dx with sciatica, pt reports same pain today but worse, starts in lower back radiates to R. Leg, EMS gave 100 mcg fentanyl  via IV, 20G left. Pt 88 RN after fentanyl , placed on 2L [place] via EMS sating 92-94

## 2024-12-21 NOTE — H&P (Signed)
 " History and Physical    Shelley Morrison DOB: 09/21/1966 DOA: 12/21/2024  DOS: the patient was seen and examined on 12/21/2024  PCP: Dyane Anthony RAMAN, FNP   Patient coming from: Home  I have personally briefly reviewed patient's old medical records in [facility] and CareEverywhere  HPI:   Shelley Morrison is a 58 y.o. year old female with medical history of hypertension, rheumatoid arthritis, insomnia presenting to the ED with right hip pain.  Pt states around 11 am yesterday she was walking and developed right hip pain. This continued to get worse. She describes it as sharp pain and states it is localized to the hip. It is severe up to 10/10. States she has not had any fevers or chills but was told she had a fever when she came here. Denies other symptoms on ROS. Denies any URI or UTI symptoms. She states she takes her medications regularly.   On arrival to the ED patient was noted to be HDS stable.  Lab work and imaging obtained.  CBC without leukocytosis and mild anemia near baseline.  ESR is 47, CRP 6.2.  CT pelvis shows no acute fractures but shows degenerative changes in the hip left greater than right with small effusion in the hip right greater than left.  CT lumbar spine showed multilevel degenerative disc disease and moderate to severe category.  Given this, [facility] contacted for admission.  Review of Systems: As mentioned in the history of present illness. All other systems reviewed and are negative.   Past Medical History:  Diagnosis Date   Diabetes type 2 (HCC) 08/12/2024   Elevated LFTs    Follows w/ [facility].   Fatty liver    Follows with [facility].   History of adenomatous polyp of colon    Hypertension    Follows w/ [facility].   Lupus 2018   Follows with [facility].   Rheumatoid arthritis (HCC)    seropositive rheumatoid arthritis, follows with [facility]   Wears contact lenses    Wears dentures    full set    Wears glasses     Past Surgical History:  Procedure Laterality Date   COLONOSCOPY  12/21/2022   with [facility]   LAPAROSCOPIC CHOLECYSTECTOMY  1997   ROBOTIC ASSISTED LAPAROSCOPIC HYSTERECTOMY AND SALPINGECTOMY Bilateral 04/30/2023   Procedure: ABORTED XI ROBOTIC ASSISTED LAPAROSCOPIC HYSTERECTOMY AND SALPINGECTOMY;  Surgeon: Darcel Pool, MD;  Location: [facility];  Service: Gynecology;  Laterality: Bilateral;   VAGINAL HYSTERECTOMY N/A 04/30/2023   Procedure: HYSTERECTOMY VAGINAL WITH MYOMECTOMY AND  BILATERAL SALPINGECTOMY;  Surgeon: Darcel Pool, MD;  Location: [facility];  Service: Gynecology;  Laterality: N/A;     Allergies[1]  Family History  Problem Relation Age of Onset   Healthy Son    Healthy Daughter     Prior to Admission medications  Medication Sig Start Date End Date Taking? Authorizing Provider  acetaminophen  (TYLENOL ) 500 MG tablet Take 1 tablet (500 mg total) by mouth every 6 (six) hours as needed for mild pain (pain score 1-3) or fever. 10/29/24   Pokhrel, Laxman, MD  Cholecalciferol (VITAMIN D ) 50 MCG (2000 UT) CAPS Take 1 capsule by mouth daily.    [provider]  feeding supplement (ENSURE PLUS HIGH PROTEIN) LIQD Take 237 mLs by mouth 2 (two) times daily between meals. 10/29/24   Pokhrel, Vernal, MD  lidocaine  (LIDODERM ) 5 % Place 1 patch onto the skin daily.  Remove & Discard patch within 12 hours or as directed by MD 12/20/24   Henderly, Britni A, PA-C  methocarbamol  (ROBAXIN ) 500 MG tablet Take 1 tablet (500 mg total) by mouth 2 (two) times daily. 12/20/24   Henderly, Britni A, PA-C  oxyCODONE -acetaminophen  (PERCOCET/ROXICET) 5-325 MG tablet Take 1 tablet by mouth every 6 (six) hours as needed for severe pain (pain score 7-10). 12/20/24   Henderly, Britni A, PA-C    Social History:  reports that she quit smoking about 20 years ago. Her smoking use included cigarettes. She started smoking about 30 years ago. She has a 7.5  pack-year smoking history. She has never been exposed to tobacco smoke. She has never used smokeless tobacco. She reports that she does not currently use alcohol. She reports that she does not use drugs.  Lives with husband, denies current smoking or drinking. Independent in ADLs and iADLs.   Physical Exam: Vitals:   12/21/24 1354 12/21/24 1633 12/21/24 1736 12/21/24 1824  BP:    (!) 156/90  Pulse:  92  87  Resp:    20  Temp: 99.4 F (37.4 C)  (!) 101.1 F (38.4 C)   TempSrc: Oral  Oral   SpO2:  94%  96%    Gen: NAD HENT: NCAT CV: normal heart sounds Lung: CTAB Abd: No TTP, normal bowel sounds MSK: No asymmetry, good bulk and tone. Limited active ROM and passive ROM due to pain. Neurovascularly intact. TTP of posterior right hip. Good movement in the left hip.  Neuro: alert and oriented   Labs on Admission: I have personally reviewed following labs and imaging studies  CBC: Recent Labs  Lab 12/21/24 1209  WBC 10.3  HGB 11.7*  HCT 36.7  MCV 93.1  PLT 217   Basic Metabolic Panel: Recent Labs  Lab 12/21/24 1209  NA 136  K 3.8  CL 106  CO2 22  GLUCOSE 123*  BUN 14  CREATININE 0.77  CALCIUM 9.4   GFR: Estimated Creatinine Clearance: 82.9 mL/min (by C-G formula based on SCr of 0.77 mg/dL). Liver Function Tests: No results for input(s): AST, ALT, ALKPHOS, BILITOT, PROT, ALBUMIN in the last 168 hours. No results for input(s): LIPASE, AMYLASE in the last 168 hours. No results for input(s): AMMONIA in the last 168 hours. Coagulation Profile: No results for input(s): INR, PROTIME in the last 168 hours. Cardiac Enzymes: No results for input(s): CKTOTAL, CKMB, CKMBINDEX, TROPONINI, TROPONINIHS in the last 168 hours. BNP (last 3 results) No results for input(s): BNP in the last 8760 hours. HbA1C: No results for input(s): HGBA1C in the last 72 hours. CBG: No results for input(s): GLUCAP in the last 168 hours. Lipid Profile: No  results for input(s): CHOL, HDL, LDLCALC, TRIG, CHOLHDL, LDLDIRECT in the last 72 hours. Thyroid Function Tests: No results for input(s): TSH, T4TOTAL, FREET4, T3FREE, THYROIDAB in the last 72 hours. Anemia Panel: No results for input(s): VITAMINB12, FOLATE, FERRITIN, TIBC, IRON, RETICCTPCT in the last 72 hours. Urine analysis:    Component Value Date/Time   COLORURINE YELLOW 12/21/2024 1830   APPEARANCEUR CLEAR 12/21/2024 1830   LABSPEC 1.019 12/21/2024 1830   PHURINE 6.0 12/21/2024 1830   GLUCOSEU NEGATIVE 12/21/2024 1830   HGBUR NEGATIVE 12/21/2024 1830   BILIRUBINUR NEGATIVE 12/21/2024 1830   KETONESUR NEGATIVE 12/21/2024 1830   PROTEINUR 100 (A) 12/21/2024 1830   NITRITE NEGATIVE 12/21/2024 1830   LEUKOCYTESUR NEGATIVE 12/21/2024 1830    Radiological Exams on Admission: I have personally reviewed images CT PELVIS  WO CONTRAST Result Date: 12/21/2024 CLINICAL DATA:  Pelvic fracture. EXAM: CT PELVIS WITHOUT CONTRAST TECHNIQUE: Multidetector CT imaging of the pelvis was performed following the standard protocol without intravenous contrast. RADIATION DOSE REDUCTION: This exam was performed according to the departmental dose-optimization program which includes automated exposure control, adjustment of the mA and/or kV according to patient size and/or use of iterative reconstruction technique. COMPARISON:  X-ray 12/20/2024 FINDINGS: Urinary Tract:  No abnormality visualized. Bowel:  Unremarkable visualized pelvic bowel loops. Vascular/Lymphatic: No pathologically enlarged lymph nodes. No significant vascular abnormality seen. Reproductive:  Hysterectomy.  There is no adnexal mass. Other:  No intraperitoneal free fluid. Musculoskeletal: SI joints and symphysis pubis unremarkable. No hip dislocation. Degenerative changes are noted in the hips, left greater than right. No femoral neck fracture. No sacral fracture. Small joint effusions evident, right greater than  left IMPRESSION: 1. No evidence for an acute fracture in the bony anatomy of the pelvis. No femoral neck fracture. 2. Degenerative changes in the hips, left greater than right. 3. Small joint effusions in the hips, right greater than left. Electronically Signed   By: Camellia Candle M.D.   On: 12/21/2024 12:06   CT Lumbar Spine Wo Contrast Result Date: 12/21/2024 EXAM: CT OF THE LUMBAR SPINE WITHOUT CONTRAST 12/21/2024 11:24:57 AM TECHNIQUE: CT of the lumbar spine was performed without the administration of intravenous contrast. Multiplanar reformatted images are provided for review. Automated exposure control, iterative reconstruction, and/or weight based adjustment of the mA/kV was utilized to reduce the radiation dose to as low as reasonably achievable. COMPARISON: CT abdomen and pelvis 01/22/2023. CLINICAL HISTORY: 58 year old female. Low back pain, increased fracture risk. FINDINGS: BONES AND ALIGNMENT: Normal lumbar segmentation. Normal lordosis. No significant scoliosis or spondylolisthesis. Intact visible sacrum and SI joints. Normal vertebral body heights. No acute osseous abnormality. Background marrow signal is normal. SOFT TISSUES: Negative lumbar paraspinal soft tissues. Chronic cholecystectomy. Moderate calcified atherosclerosis of the abdominal aorta, and especially at the aortoiliac bifurcation. Stable other visible non-contrast abdominal viscera. DEGENERATIVE CHANGES: Lower thoracic facet hypertrophy at T11-T12 and T12-L1. No lower thoracic spinal stenosis. L1-L2: disc bulging  with mild posterior element hypertrophy. No spinal stenosis. L2-L3: Similar circumferential disc bulging and mild facet hypertrophy with mild superimposed epidural lipomatosis (series 11 image 60). Mild spinal stenosis. L3-L4: More pronounced disc space loss and bulky circumferential disc bulge. Mild to moderate posterior element hypertrophy. Mild spinal stenosis. Moderate bilateral L3 neural foraminal stenosis. L4-L5:  Similar bulky circumferential disc bulge, moderate to severe facet and ligamentum flavum hypertrophy. Moderate to severe spinal stenosis (series 11 image 93). Moderate bilateral L4 neural foraminal stenosis. L5-S1: Circumferential disc bulging asymmetric to the left (series 11 image 106). Moderate facet hypertrophy. No significant spinal stenosis. Mild to moderate right L5 neural foraminal stenosis. IMPRESSION: 1. No acute osseous abnormality in the lumbar spine. 2. Degenerative changes, worst at L4-L5 with moderate-severe multifactorial spinal stenosis. And multilevel up to moderate lumbar neural foraminal stenosis. Electronically signed by: Helayne Hurst MD 12/21/2024 11:47 AM EST RP Workstation: [id number]   DG Hip Unilat W or Wo Pelvis 2-3 Views Right Result Date: 12/20/2024 CLINICAL DATA:  Right hip pain. EXAM: DG HIP (WITH OR WITHOUT PELVIS) 2-3V RIGHT COMPARISON:  None Available. FINDINGS: No acute fracture or dislocation. Mild bilateral hip arthritic changes. The soft tissues are unremarkable. IMPRESSION: 1. No acute fracture or dislocation. 2. Mild bilateral hip arthritic changes. Electronically Signed   By: Vanetta Chou M.D.   On: 12/20/2024 17:38    EKG: My  personal interpretation of EKG shows: Pending   Assessment/Plan Principal Problem:   Intractable pain Active Problems:   Primary insomnia   Essential hypertension   Vitamin D  deficiency   Rheumatoid arthritis with rheumatoid factor of multiple sites without organ or systems involvement (HCC)   History of systemic lupus erythematosus (HCC)   Right hip pain:  Patient patient with right hip severe pain. Pt admitted for pain control. Ddx include OA vs RA flare vs septic joint. Imaging shows arthritis in both hips. Effusions can be seen in OA and RA. Will consult orthopedic surgery. Will start multimodal pain control. Started on oxycodone  5 mg q6hrs prn. IV Dilaudid  1 mg q2hrs for breakthrough pain.  Normal renal function and no hx of  PUD noted. Started robaxin  500 mg q4hrs. PT and OT consulted.   Febrile episode: Patient with fever at 101.1.  Blood cultures ordered.  UA and respiratory panel ordered.  Exactly unsure of the etiology but septic joint is lower on my differential currently but if continues to have fever will be higher on the differential. Holding APAP to monitor fever trend. If other workup is negative.  Will consult orthopedic to aspirate hip joint.    Hypertension: On losartan  50 mg, will continue..  Blood pressure is elevated here in 160s over 90s.  Will use IV labetalol prn.  Type 2 diabetes: Not on any pharmacotherapy.  Will start SSI.  Rheumatoid arthritis/history of SLE: Sees rheumatology outpatient.  Outpatient records states patient should be on sulfasalazine  500 mg twice daily.  Insomnia: Trazodone  100 mg as needed at bedtime.  Palpitations: on atenolol as needed for palpitations.   VTE prophylaxis:  SQ Heparin   Diet: Regular Code Status:  Full Code Telemetry:  Admission status: Observation, Med-Surg Patient is from: Home Anticipated d/c is to: Home Anticipated d/c is in: 1-2 days   Family Communication: Updated at bedside  Consults called: Orthopedic surgery   Severity of Illness: The appropriate patient status for this patient is OBSERVATION. Observation status is judged to be reasonable and necessary in order to provide the required intensity of service to ensure the patient's safety. The patient's presenting symptoms, physical exam findings, and initial radiographic and laboratory data in the context of their medical condition is felt to place them at decreased risk for further clinical deterioration. Furthermore, it is anticipated that the patient will be medically stable for discharge from the hospital within 2 midnights of admission.    Morene Bathe, MD [place] DEL. [facility]      [1]  Allergies Allergen Reactions   Acetaminophen -Codeine Other (See Comments)     Causes flank pain per patient.SABRA   "

## 2024-12-21 NOTE — ED Provider Notes (Signed)
 " [place] [facility] AT [facility] Provider Note   CSN: [id number] Arrival date & time: 12/21/24  9062     Patient presents with: Back Pain   Tashari L Heiny is a 58 y.o. female.   58 year old female presenting emergency department with right hip pain.  Symptoms started yesterday acutely at 1130.  She got up from a seated position was walking and then felt her right hip lock up on her.  Severe pain since that time.  Was seen in the emergency department yesterday had negative hip x-ray, discharged with pain medications.  Reports pain persisted difficulty ambulating secondary to pain.  No numbness tingling changes in sensation.  No bowel or bladder incontinence.  No saddle anesthesia.  Reports pain largely to the lateral aspect of her hip radiating around pelvis towards groin some radiation down the anterior thigh as well.   Back Pain      Prior to Admission medications  Medication Sig Start Date End Date Taking? Authorizing Provider  acetaminophen  (TYLENOL ) 500 MG tablet Take 1 tablet (500 mg total) by mouth every 6 (six) hours as needed for mild pain (pain score 1-3) or fever. 10/29/24   Pokhrel, Laxman, MD  Cholecalciferol (VITAMIN D ) 50 MCG (2000 UT) CAPS Take 1 capsule by mouth daily.    [provider]  feeding supplement (ENSURE PLUS HIGH PROTEIN) LIQD Take 237 mLs by mouth 2 (two) times daily between meals. 10/29/24   Pokhrel, Vernal, MD  lidocaine  (LIDODERM ) 5 % Place 1 patch onto the skin daily. Remove & Discard patch within 12 hours or as directed by MD 12/20/24   Henderly, Britni A, PA-C  methocarbamol  (ROBAXIN ) 500 MG tablet Take 1 tablet (500 mg total) by mouth 2 (two) times daily. 12/20/24   Henderly, Britni A, PA-C  oxyCODONE -acetaminophen  (PERCOCET/ROXICET) 5-325 MG tablet Take 1 tablet by mouth every 6 (six) hours as needed for severe pain (pain score 7-10). 12/20/24   Henderly, Britni A, PA-C    Allergies: Acetaminophen -codeine    Review  of Systems  Musculoskeletal:  Positive for back pain.    Updated Vital Signs BP (!) 147/88   Pulse (!) 105   Temp 99.4 F (37.4 C) (Oral)   Resp 18   LMP 09/14/2017   SpO2 93%   Physical Exam Vitals and nursing note reviewed.  Constitutional:      General: She is not in acute distress.    Appearance: She is not toxic-appearing.  HENT:     Mouth/Throat:     Mouth: Mucous membranes are moist.  Eyes:     Conjunctiva/sclera: Conjunctivae normal.  Cardiovascular:     Rate and Rhythm: Normal rate.  Pulmonary:     Effort: Pulmonary effort is normal.  Abdominal:     General: Abdomen is flat. There is no distension.     Tenderness: There is no abdominal tenderness. There is no guarding or rebound.  Musculoskeletal:     Comments: 5-5 plantarflexion dorsiflexion.  Normal sensation to lower extremities.  Equal pulses.  No midline spinal tenderness.  Skin:    General: Skin is warm and dry.     Capillary Refill: Capillary refill takes less than 2 seconds.  Neurological:     Mental Status: She is alert and oriented to person, place, and time.  Psychiatric:        Mood and Affect: Mood normal.        Behavior: Behavior normal.     (all labs ordered are listed,  but only abnormal results are displayed) Labs Reviewed  CBC - Abnormal; Notable for the following components:      Result Value   Hemoglobin 11.7 (*)    All other components within normal limits  BASIC METABOLIC PANEL WITH GFR - Abnormal; Notable for the following components:   Glucose, Bld 123 (*)    All other components within normal limits  SEDIMENTATION RATE  C-REACTIVE PROTEIN    EKG: None  Radiology: CT PELVIS WO CONTRAST Result Date: 12/21/2024 CLINICAL DATA:  Pelvic fracture. EXAM: CT PELVIS WITHOUT CONTRAST TECHNIQUE: Multidetector CT imaging of the pelvis was performed following the standard protocol without intravenous contrast. RADIATION DOSE REDUCTION: This exam was performed according to the  departmental dose-optimization program which includes automated exposure control, adjustment of the mA and/or kV according to patient size and/or use of iterative reconstruction technique. COMPARISON:  X-ray 12/20/2024 FINDINGS: Urinary Tract:  No abnormality visualized. Bowel:  Unremarkable visualized pelvic bowel loops. Vascular/Lymphatic: No pathologically enlarged lymph nodes. No significant vascular abnormality seen. Reproductive:  Hysterectomy.  There is no adnexal mass. Other:  No intraperitoneal free fluid. Musculoskeletal: SI joints and symphysis pubis unremarkable. No hip dislocation. Degenerative changes are noted in the hips, left greater than right. No femoral neck fracture. No sacral fracture. Small joint effusions evident, right greater than left IMPRESSION: 1. No evidence for an acute fracture in the bony anatomy of the pelvis. No femoral neck fracture. 2. Degenerative changes in the hips, left greater than right. 3. Small joint effusions in the hips, right greater than left. Electronically Signed   By: Camellia Candle M.D.   On: 12/21/2024 12:06   CT Lumbar Spine Wo Contrast Result Date: 12/21/2024 EXAM: CT OF THE LUMBAR SPINE WITHOUT CONTRAST 12/21/2024 11:24:57 AM TECHNIQUE: CT of the lumbar spine was performed without the administration of intravenous contrast. Multiplanar reformatted images are provided for review. Automated exposure control, iterative reconstruction, and/or weight based adjustment of the mA/kV was utilized to reduce the radiation dose to as low as reasonably achievable. COMPARISON: CT abdomen and pelvis 01/22/2023. CLINICAL HISTORY: 58 year old female. Low back pain, increased fracture risk. FINDINGS: BONES AND ALIGNMENT: Normal lumbar segmentation. Normal lordosis. No significant scoliosis or spondylolisthesis. Intact visible sacrum and SI joints. Normal vertebral body heights. No acute osseous abnormality. Background marrow signal is normal. SOFT TISSUES: Negative lumbar  paraspinal soft tissues. Chronic cholecystectomy. Moderate calcified atherosclerosis of the abdominal aorta, and especially at the aortoiliac bifurcation. Stable other visible non-contrast abdominal viscera. DEGENERATIVE CHANGES: Lower thoracic facet hypertrophy at T11-T12 and T12-L1. No lower thoracic spinal stenosis. L1-L2: disc bulging  with mild posterior element hypertrophy. No spinal stenosis. L2-L3: Similar circumferential disc bulging and mild facet hypertrophy with mild superimposed epidural lipomatosis (series 11 image 60). Mild spinal stenosis. L3-L4: More pronounced disc space loss and bulky circumferential disc bulge. Mild to moderate posterior element hypertrophy. Mild spinal stenosis. Moderate bilateral L3 neural foraminal stenosis. L4-L5: Similar bulky circumferential disc bulge, moderate to severe facet and ligamentum flavum hypertrophy. Moderate to severe spinal stenosis (series 11 image 93). Moderate bilateral L4 neural foraminal stenosis. L5-S1: Circumferential disc bulging asymmetric to the left (series 11 image 106). Moderate facet hypertrophy. No significant spinal stenosis. Mild to moderate right L5 neural foraminal stenosis. IMPRESSION: 1. No acute osseous abnormality in the lumbar spine. 2. Degenerative changes, worst at L4-L5 with moderate-severe multifactorial spinal stenosis. And multilevel up to moderate lumbar neural foraminal stenosis. Electronically signed by: Helayne Hurst MD 12/21/2024 11:47 AM EST RP Workstation: [id number]  DG Hip Unilat W or Wo Pelvis 2-3 Views Right Result Date: 12/20/2024 CLINICAL DATA:  Right hip pain. EXAM: DG HIP (WITH OR WITHOUT PELVIS) 2-3V RIGHT COMPARISON:  None Available. FINDINGS: No acute fracture or dislocation. Mild bilateral hip arthritic changes. The soft tissues are unremarkable. IMPRESSION: 1. No acute fracture or dislocation. 2. Mild bilateral hip arthritic changes. Electronically Signed   By: Vanetta Chou M.D.   On: 12/20/2024 17:38      Procedures   Medications Ordered in the ED  lidocaine  (LIDODERM ) 5 % 1 patch (1 patch Transdermal Patch Applied 12/21/24 1256)  HYDROmorphone  (DILAUDID ) injection 0.5 mg (0.5 mg Intravenous Given 12/21/24 1256)  ketorolac  (TORADOL ) 15 MG/ML injection 15 mg (15 mg Intravenous Given 12/21/24 1257)  methocarbamol  (ROBAXIN ) tablet 500 mg (500 mg Oral Given 12/21/24 1345)    Clinical Course as of 12/21/24 1535  Wed Dec 21, 2024  1216 CT Lumbar Spine Wo Contrast IMPRESSION: 1. No acute osseous abnormality in the lumbar spine. 2. Degenerative changes, worst at L4-L5 with moderate-severe multifactorial spinal stenosis. And multilevel up to moderate lumbar neural foraminal stenosis.   [TY]  1217 CT PELVIS WO CONTRAST IMPRESSION: 1. No evidence for an acute fracture in the bony anatomy of the pelvis. No femoral neck fracture. 2. Degenerative changes in the hips, left greater than right. 3. Small joint effusions in the hips, right greater than left.   Electronically Signed   By: Camellia Candle M.D.   On: 12/21/2024 12:06   [TY]  1404 Patient did have some improvement of pain after medications.  Repeat vitals improving.  She is unable to get out of the bed due to severe pain to her right hip.  Will add on ESR/CRP and give robaxin  [TY]  1533 Care signed out to afternoon team pending ESR/CRP and re-evaluation.  [TY]    Clinical Course User Index [TY] Neysa Caron PARAS, DO                                 Medical Decision Making Is a 59 year old female past medical history significant for hypertension, lupus, rheumatoid arthritis presented to the emergency department for hip pain.  Seen recently negative x-ray today.  Pain localizes to her right hip.  No redness or warmth.  She also complains of low back pain as well.  Lower suspicion for acute cord compression based on exam.  Will obtain CT lumbar and pelvis.  Treat with pain medications and reevaluate.  See ED course for further MDM and  final disposition.  Amount and/or Complexity of Data Reviewed Labs: ordered. Radiology: ordered. Decision-making details documented in ED Course.  Risk Prescription drug management. Parenteral controlled substances.      Final diagnoses:  None    ED Discharge Orders     None          Neysa Caron PARAS, DO 12/21/24 1535  "

## 2024-12-21 NOTE — ED Notes (Signed)
 Attempted to ambulate pt, unable to sit up or stand due to right hip pain.

## 2024-12-22 DIAGNOSIS — Z860101 Personal history of adenomatous and serrated colon polyps: Secondary | ICD-10-CM | POA: Diagnosis not present

## 2024-12-22 DIAGNOSIS — I10 Essential (primary) hypertension: Secondary | ICD-10-CM | POA: Diagnosis present

## 2024-12-22 DIAGNOSIS — M5136 Other intervertebral disc degeneration, lumbar region with discogenic back pain only: Secondary | ICD-10-CM | POA: Diagnosis present

## 2024-12-22 DIAGNOSIS — Z6826 Body mass index (BMI) 26.0-26.9, adult: Secondary | ICD-10-CM | POA: Diagnosis not present

## 2024-12-22 DIAGNOSIS — M0579 Rheumatoid arthritis with rheumatoid factor of multiple sites without organ or systems involvement: Secondary | ICD-10-CM

## 2024-12-22 DIAGNOSIS — R52 Pain, unspecified: Secondary | ICD-10-CM | POA: Diagnosis present

## 2024-12-22 DIAGNOSIS — M329 Systemic lupus erythematosus, unspecified: Secondary | ICD-10-CM | POA: Diagnosis present

## 2024-12-22 DIAGNOSIS — E119 Type 2 diabetes mellitus without complications: Secondary | ICD-10-CM | POA: Diagnosis present

## 2024-12-22 DIAGNOSIS — D649 Anemia, unspecified: Secondary | ICD-10-CM | POA: Diagnosis present

## 2024-12-22 DIAGNOSIS — F5101 Primary insomnia: Secondary | ICD-10-CM | POA: Diagnosis present

## 2024-12-22 DIAGNOSIS — Z9049 Acquired absence of other specified parts of digestive tract: Secondary | ICD-10-CM | POA: Diagnosis not present

## 2024-12-22 DIAGNOSIS — M160 Bilateral primary osteoarthritis of hip: Secondary | ICD-10-CM | POA: Diagnosis present

## 2024-12-22 DIAGNOSIS — Z885 Allergy status to narcotic agent status: Secondary | ICD-10-CM | POA: Diagnosis not present

## 2024-12-22 DIAGNOSIS — Z90710 Acquired absence of both cervix and uterus: Secondary | ICD-10-CM | POA: Diagnosis not present

## 2024-12-22 DIAGNOSIS — E559 Vitamin D deficiency, unspecified: Secondary | ICD-10-CM | POA: Diagnosis present

## 2024-12-22 DIAGNOSIS — Z79899 Other long term (current) drug therapy: Secondary | ICD-10-CM | POA: Diagnosis not present

## 2024-12-22 DIAGNOSIS — Z87891 Personal history of nicotine dependence: Secondary | ICD-10-CM | POA: Diagnosis not present

## 2024-12-22 DIAGNOSIS — K760 Fatty (change of) liver, not elsewhere classified: Secondary | ICD-10-CM | POA: Diagnosis present

## 2024-12-22 DIAGNOSIS — E663 Overweight: Secondary | ICD-10-CM | POA: Diagnosis present

## 2024-12-22 DIAGNOSIS — K5900 Constipation, unspecified: Secondary | ICD-10-CM | POA: Diagnosis present

## 2024-12-22 DIAGNOSIS — G4700 Insomnia, unspecified: Secondary | ICD-10-CM | POA: Diagnosis present

## 2024-12-22 DIAGNOSIS — Z1152 Encounter for screening for COVID-19: Secondary | ICD-10-CM | POA: Diagnosis not present

## 2024-12-22 LAB — BASIC METABOLIC PANEL WITH GFR
Anion gap: 10 (ref 5–15)
BUN: 10 mg/dL (ref 6–20)
CO2: 23 mmol/L (ref 22–32)
Calcium: 9.4 mg/dL (ref 8.9–10.3)
Chloride: 105 mmol/L (ref 98–111)
Creatinine, Ser: 0.78 mg/dL (ref 0.44–1.00)
GFR, Estimated: >60 mL/min
Glucose, Bld: 116 mg/dL — ABNORMAL HIGH (ref 70–99)
Potassium: 3.9 mmol/L (ref 3.5–5.1)
Sodium: 138 mmol/L (ref 135–145)

## 2024-12-22 LAB — CBC
HCT: 37.1 % (ref 36.0–46.0)
Hemoglobin: 11.8 g/dL — ABNORMAL LOW (ref 12.0–15.0)
MCH: 29.4 pg (ref 26.0–34.0)
MCHC: 31.8 g/dL (ref 30.0–36.0)
MCV: 92.5 fL (ref 80.0–100.0)
Platelets: 210 K/uL (ref 150–400)
RBC: 4.01 MIL/uL (ref 3.87–5.11)
RDW: 13.6 % (ref 11.5–15.5)
WBC: 7.1 K/uL (ref 4.0–10.5)
nRBC: 0 % (ref 0.0–0.2)

## 2024-12-22 LAB — GLUCOSE, CAPILLARY
Glucose-Capillary: 112 mg/dL — ABNORMAL HIGH (ref 70–99)
Glucose-Capillary: 78 mg/dL (ref 70–99)
Glucose-Capillary: 114 mg/dL — ABNORMAL HIGH (ref 70–99)
Glucose-Capillary: 69 mg/dL — ABNORMAL LOW (ref 70–99)
Glucose-Capillary: 116 mg/dL — ABNORMAL HIGH (ref 70–99)

## 2024-12-22 NOTE — Progress Notes (Signed)
" °   12/22/24 1031  TOC Brief Assessment  Insurance and Status Reviewed  Patient has primary care physician Yes  Home environment has been reviewed home  Prior level of function: Brief  Prior/Current Home Services No current home services  Social Drivers of Health Review SDOH reviewed no interventions necessary  Readmission risk has been reviewed Yes  Transition of care needs no transition of care needs at this time    "

## 2024-12-22 NOTE — Hospital Course (Addendum)
 Shelley Morrison is a 58 y.o. year old female with medical history of hypertension, rheumatoid arthritis, insomnia presenting to the ED with right hip pain.  Pt states around 11 am yesterday she was walking and developed right hip pain. This continued to get worse. She describes it as sharp pain and states it is localized to the hip. It is severe up to 10/10. States she has not had any fevers or chills but was told she had a fever when she came here. Denies other symptoms on ROS. Denies any URI or UTI symptoms. She states she takes her medications regularly.   On arrival to the ED patient was noted to be HDS stable.  Lab work and imaging obtained.  CBC without leukocytosis and mild anemia near baseline.  ESR is 47, CRP 6.2.  CT pelvis shows no acute fractures but shows degenerative changes in the hip left greater than right with small effusion in the hip right greater than left.  CT lumbar spine showed multilevel degenerative disc disease and moderate to severe category.  Given this, [facility] contacted for admission.  Ortho consulted and recommending holding Abx until Aspiration. IR aspirated and pending results but showing NG currently. Had a slight Temp the night before last but will continue to hold on placing Abx.   Assessment and Plan:   Right Hip Pain: Patient patient with right hip severe pain. Pt admitted for pain control. Ddx include OA vs RA flare vs septic joint. Imaging shows arthritis in both hips. Effusions can be seen in OA and RA. Will consult orthopedic surgery and hold off Abx until aspiration. CRP was 6.2 and ESR was 47. Will start multimodal pain control and  she was Started on oxycodone  5 mg q6hrs prn but will increase to 5-10. IV Dilaudid  1 mg q2hrs for breakthrough pain.  Normal renal function and no hx of PUD noted. Started robaxin  500 mg q4hrs. PT and OT consulted and they are recommending home health follow-up. IR aspirated Hip and showed:  Gram Stain ABUNDANT WBC PRESENT, PREDOMINANTLY  PMN NO ORGANISMS SEEN  Culture NO GROWTH < 24 HOURS Performed at [facility], [address], [place], [place] [address]  -Per Orthopedic Surgery no overnight events and they agree make further recommendations once able to follow-up on results   Febrile Episode: Patient with fever at 101.1 on admission and had another febrile episode of 100.4 but fevers have improved.Blood cultures ordered and showing NGTD @ 3 days. UA [place]. Respiratory panel ordered.  Exactly unsure of the etiology but septic joint is lower on my differential currently but if continues to have fever will be higher on the differential. Holding APAP to monitor fever trend. If other workup is negative.  Orthopedic Surgery consulted and recommending aspiration and holding on antibiotics until this can be obtained; Aspiration done and pending results. Continue to Hold off Abx for now    Essential Hypertension: On losartan  50 mg and resumed. CTM BP per Protocol. Will use IV labetalol prn. Last BP reading was 143/87   Type 2 diabetes: Not on any pharmacotherapy.  Will start SSI. CBG Trend:  Recent Labs  Lab 12/23/24 0742 12/23/24 1131 12/23/24 1611 12/23/24 2107 12/24/24 0749 12/24/24 1141 12/24/24 1753  GLUCAP 101* 99 127* 152* 91 93 100*  -HbA1c is now 6.2. CTM CBG's per Protocol   Rheumatoid Arthritis/History of SLE: Sees Rheumatology outpatient.  Outpatient records states patient should be on Sulfasalazine  500 mg twice daily and now resumed  Insomnia: No longer taking Trazodone  100 mg as needed at bedtime.   Palpitations: No longer on Atenolol as needed for Palpitations  Normocytic Anemia: Hgb/Hct Trend:  Recent Labs  Lab 12/21/24 1209 12/22/24 0541 12/23/24 0505 12/24/24 0548  HGB 11.7* 11.8* 11.8* 11.5*  HCT 36.7 37.1 37.1 36.2  MCV 93.1 92.5 92.3 91.4  -Checked Anemia Panel and showed an iron level of 26, UIBC of 230, TIBC of 256, ferritin level of 438, folate level 7.5 and vitamin B12 462. CTM for  S/Sx of Bleeding; No overt bleeding noted -Repeat CBC in the AM   Overweight: Complicates overall prognosis and care. Estimated body mass index is 26.86 kg/m as calculated from the following:   Height as of 12/20/24: 5' 10 (1.778 m).   Weight as of this encounter: 84.9 kg. Weight Loss and Dietary Counseling given

## 2024-12-22 NOTE — Progress Notes (Signed)
 Pt recommended for SNF. PASSR obtained and FL2 completed. SNF ref faxed, awaiting bed offers to present to pt.

## 2024-12-22 NOTE — [place] FL2 (Signed)
 " [place]  MEDICAID [id number] LEVEL OF CARE FORM     IDENTIFICATION  Patient Name: Shelley Morrison Birthdate: 1966-06-28 Sex: female Admission Date (Current Location): 12/21/2024  [facility] and [place] Number:  Producer, Television/film/video and Address:  [facility],  [address], [place] [address]      Provider Number: [id number]  Attending Physician Name and Address:  Sherrill Alejandro Donovan, DO  Relative Name and Phone Number:  Tod Arland Ahumada, [phone]    Current Level of Care: Hospital Recommended Level of Care: Skilled Nursing Facility Prior Approval Number:    Date Approved/Denied:   PASRR Number: [id number]  Discharge Plan: SNF    Current Diagnoses: Patient Active Problem List   Diagnosis Date Noted   Intractable pain 12/21/2024   Dehydration 10/28/2024   Generalized weakness 10/28/2024   Intractable nausea and vomiting 10/28/2024   History of systemic lupus erythematosus (HCC) 10/28/2024   History of rheumatoid arthritis 10/28/2024   AKI (acute kidney injury) 10/27/2024   Uterine fibroid 04/30/2023   Rheumatoid arthritis with rheumatoid factor of multiple sites without organ or systems involvement (HCC) 06/19/2017   High risk medication use 06/18/2017   Vitamin D  deficiency 06/18/2017   Primary insomnia 05/19/2017   Essential hypertension 05/19/2017   ANA positive 05/08/2017   Rheumatoid factor positive 05/08/2017    Orientation RESPIRATION BLADDER Height & Weight     Self, Time, Situation, Place  Normal Incontinent Weight: 190 lb 14.7 oz (86.6 kg) Height:     BEHAVIORAL SYMPTOMS/MOOD NEUROLOGICAL BOWEL NUTRITION STATUS      Continent Diet (see dc summary)  AMBULATORY STATUS COMMUNICATION OF NEEDS Skin   Limited Assist Verbally Normal                       Personal Care Assistance Level of Assistance  Dressing, Feeding, Bathing Bathing Assistance: Limited assistance Feeding assistance: Limited assistance Dressing Assistance: Limited  assistance     Functional Limitations Info  Speech, Hearing, Sight Sight Info: Adequate Hearing Info: Adequate Speech Info: Adequate    SPECIAL CARE FACTORS FREQUENCY  PT (By licensed PT), OT (By licensed OT)     PT Frequency: 5x/wk OT Frequency: 5x/wk            Contractures Contractures Info: Not present    Additional Factors Info  Code Status, Allergies Code Status Info: Full code Allergies Info: Acetaminophen -codeine           Current Medications (12/22/2024):  This is the current hospital active medication list Current Facility-Administered Medications  Medication Dose Route Frequency Provider Last Rate Last Admin   acetaminophen  (TYLENOL ) tablet 650 mg  650 mg Oral Q6H PRN Khan, Ghalib, MD       heparin  injection 5,000 Units  5,000 Units Subcutaneous Q8H Khan, Ghalib, MD   5,000 Units at 12/22/24 0550   HYDROmorphone  (DILAUDID ) injection 0.5-1 mg  0.5-1 mg Intravenous Q2H PRN Khan, Ghalib, MD   1 mg at 12/22/24 1146   insulin  aspart (novoLOG ) injection 0-5 Units  0-5 Units Subcutaneous QHS Khan, Ghalib, MD       insulin  aspart (novoLOG ) injection 0-9 Units  0-9 Units Subcutaneous TID WC Fernand Prost, MD       methocarbamol  (ROBAXIN ) injection 500 mg  500 mg Intravenous Q6H PRN Fernand Prost, MD       ondansetron  (ZOFRAN ) tablet 4 mg  4 mg Oral Q6H PRN Fernand Prost, MD       Or   ondansetron  (ZOFRAN )  injection 4 mg  4 mg Intravenous Q6H PRN Fernand Prost, MD       oxyCODONE  (Oxy IR/ROXICODONE ) immediate release tablet 5 mg  5 mg Oral Q4H PRN Khan, Ghalib, MD   5 mg at 12/22/24 0549   senna-docusate (Senokot-S) tablet 1 tablet  1 tablet Oral QHS PRN Fernand Prost, MD       sodium chloride  flush (NS) 0.9 % injection 3 mL  3 mL Intravenous Q12H Khan, Ghalib, MD   3 mL at 12/22/24 9060     Discharge Medications: Please see discharge summary for a list of discharge medications.  Relevant Imaging Results:  Relevant Lab Results:   Additional Information SSN  [id number]  Sheri ONEIDA Sharps, LCSW     "

## 2024-12-22 NOTE — Evaluation (Signed)
 Physical Therapy Evaluation Patient Details Name: Shelley Morrison MRN: [id number] DOB: 07-Apr-1966 Today's Date: 12/22/2024  History of Present Illness  58 yo female presents to therapy following hospital admission on 12/21/24 due to right hip pain.CT pelvis showed no acute fractures but shows degenerative changes in hip L>R and small effusion in R hip. CT lumbar showed multi-level degenerative disc disease.   PMH includes but is not limited to: DM II, elevated LFTs, HTN, lupus, RA,  cholecystectomy and fatty liver.  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 She lives with her spouse in a town home with 3 STE and flight up to bed/bath. She is currently requiring MOD A for sit<>supine due to significant pain which worsens with mobility and decreased PROM/AROM. See below for additional deficits. She is well below her baseline of functional independence. She will benefit from continued skilled therapy while admitted to acute hospital, and additional therapy at discharge with <3hrs/day.       If plan is discharge home, recommend the following: A little help with walking and/or transfers;A little help with bathing/dressing/bathroom;Assist for transportation   Can travel by private vehicle   No    Equipment Recommendations Wheelchair cushion (measurements PT);Wheelchair (measurements PT);Rolling walker (2 wheels);Other (comment) (if going SNF, can defer to next level of care)  Recommendations for Other Services       Functional Status Assessment Patient has had a recent decline in their functional status and demonstrates the ability to make significant improvements in function in a reasonable and predictable amount of time.     Precautions / Restrictions Precautions Precautions: None Restrictions Weight Bearing Restrictions Per Provider Order: No Other Position/Activity Restrictions: no formal weight bearing restrictions, pt has poor tolerance to weightbearing on R leg       Mobility  Bed Mobility Overal bed mobility: Needs Assistance Bed Mobility: Supine to Sit, Sit to Supine     Supine to sit: HOB elevated, Used rails, Mod assist Sit to supine: HOB elevated, Used rails, Mod assist   General bed mobility comments: increased time and effort for bed mobility secondary to pain. PT supported RLE at all times, pt able to use BUE on bedrails to assist with scooting and elevating trunk. Once seated at EOB, significant left lateral line with poor weight acceptance through R pelvis with significant increase in pain when pt attempts to shift weight    Transfers Overall transfer level:  (deferred transfers during evaluation due to significant pain and pt becoming fatigued due to pain medications)                      Ambulation/Gait                  Stairs            Wheelchair Mobility     Tilt Bed    Modified Rankin (Stroke Patients Only)       Balance Overall balance assessment: Needs assistance Sitting-balance support: Bilateral upper extremity supported, Feet supported Sitting balance-Leahy Scale: Fair Sitting balance - Comments: unable to sit comfortably due to pain, left lateral lean, no weight through R hip/pelvis       Standing balance comment: unable to attempt standing balance secondary to pain                             Pertinent Vitals/Pain Pain Assessment Pain Assessment: 0-10 Pain  Score: 8  Pain Location: R hip radiates to low back when seated Pain Descriptors / Indicators: Aching, Sharp, Shooting, Spasm, Grimacing, Dull Pain Intervention(s): RN gave pain meds during session, Monitored during session, Repositioned    Home Living Family/patient expects to be discharged to:: Private residence Living Arrangements: Spouse/significant other Available Help at Discharge: Family;Available PRN/intermittently (sister can help during the day when spouse is at work) Type of Home: House Home Access:  Stairs to enter Entrance Stairs-Rails: Left Entrance Stairs-Number of Steps: 3 Alternate Level Stairs-Number of Steps: 14 Home Layout: Two level;Bed/bath upstairs;1/2 bath on main level Home Equipment: None Additional Comments: pt reports she can stay with her sister at discharge if needed; sister lives in an apartment with elevator access and no steps into building    Prior Function Prior Level of Function : Driving;Working/employed;Independent/Modified Independent             Mobility Comments: works FT as LAWYER ADLs Comments: IND, spouse provides at IADLs     Extremity/Trunk Assessment   Upper Extremity Assessment Upper Extremity Assessment: Defer to OT evaluation    Lower Extremity Assessment Lower Extremity Assessment: Generalized weakness;RLE deficits/detail RLE Deficits / Details: limited ROM with hip flezion, hip abd/add, and knee flexion secondary to pain; limbs rests in ~15 degrees flexion, able to tolerate addition 20-30deg PROM however increase in pain. TTP along lateral hip and anterior tight, mild warm to touch, tightness noted along IT band RLE: Unable to fully assess due to pain RLE Sensation: WNL    Cervical / Trunk Assessment Cervical / Trunk Assessment: Normal  Communication   Communication Communication: No apparent difficulties    Cognition Arousal: Alert Behavior During Therapy: WFL for tasks assessed/performed   PT - Cognitive impairments: No apparent impairments                         Following commands: Intact       Cueing Cueing Techniques: Verbal cues, Tactile cues     General Comments      Exercises     Assessment/Plan    PT Assessment Patient needs continued PT services  PT Problem List Decreased strength;Pain;Decreased activity tolerance;Decreased balance;Decreased mobility;Decreased range of motion       PT Treatment Interventions Gait training;Balance training;Functional mobility training;Therapeutic  activities;Therapeutic exercise;Patient/family education;Stair training    PT Goals (Current goals can be found in the Care Plan section)  Acute Rehab PT Goals Patient Stated Goal: bed able to move with less pain PT Goal Formulation: With patient Time For Goal Achievement: 01/05/25 Potential to Achieve Goals: Good    Frequency Min 2X/week     Co-evaluation               AM-PAC PT 6 Clicks Mobility  Outcome Measure Help needed turning from your back to your side while in a flat bed without using bedrails?: A Lot Help needed moving from lying on your back to sitting on the side of a flat bed without using bedrails?: A Lot Help needed moving to and from a bed to a chair (including a wheelchair)?: Total Help needed standing up from a chair using your arms (e.g., wheelchair or bedside chair)?: Total Help needed to walk in hospital room?: Total Help needed climbing 3-5 steps with a railing? : Total 6 Click Score: 8    End of Session   Activity Tolerance: Patient limited by pain;Patient limited by lethargy Patient left: in bed;with call bell/phone within reach;with bed alarm  set Nurse Communication: Mobility status;Other (comment) (pain, lethargic) PT Visit Diagnosis: Muscle weakness (generalized) (M62.81);Pain;Difficulty in walking, not elsewhere classified (R26.2) Pain - Right/Left: Right Pain - part of body: Hip;Knee    Time: 8852-8784 PT Time Calculation (min) (ACUTE ONLY): 28 min   Charges:   PT Evaluation $PT Eval Moderate Complexity: 1 Mod PT Treatments $Therapeutic Activity: 8-22 mins           Isaiah DEL. Nattaly Yebra, PT, DPT   [employer] 12/22/2024, 12:33 PM

## 2024-12-22 NOTE — Progress Notes (Signed)
 " PROGRESS NOTE    Shelley Morrison  FMW:[id number] DOB: 1966/05/25 DOA: 12/21/2024 PCP: Dyane Anthony RAMAN, FNP   Brief Narrative:  Shelley Morrison is a 58 y.o. year old female with medical history of hypertension, rheumatoid arthritis, insomnia presenting to the ED with right hip pain.  Pt states around 11 am yesterday she was walking and developed right hip pain. This continued to get worse. She describes it as sharp pain and states it is localized to the hip. It is severe up to 10/10. States she has not had any fevers or chills but was told she had a fever when she came here. Denies other symptoms on ROS. Denies any URI or UTI symptoms. She states she takes her medications regularly.   On arrival to the ED patient was noted to be HDS stable.  Lab work and imaging obtained.  CBC without leukocytosis and mild anemia near baseline.  ESR is 47, CRP 6.2.  CT pelvis shows no acute fractures but shows degenerative changes in the hip left greater than right with small effusion in the hip right greater than left.  CT lumbar spine showed multilevel degenerative disc disease and moderate to severe category.  Given this, [facility] contacted for admission.  Ortho consulted and recommending holding Abx until Aspiration. IR to aspirate in the AM.   Assessment and Plan:   Right Hip Pain: Patient patient with right hip severe pain. Pt admitted for pain control. Ddx include OA vs RA flare vs septic joint. Imaging shows arthritis in both hips. Effusions can be seen in OA and RA. Will consult orthopedic surgery and hold off Abx until aspiration. CRP was 6.2 and ESR was 47. Will start multimodal pain control. Started on oxycodone  5 mg q6hrs prn. IV Dilaudid  1 mg q2hrs for breakthrough pain.  Normal renal function and no hx of PUD noted. Started robaxin  500 mg q4hrs. PT and OT consulted. IR to aspirate Hip in the AM   Febrile episode: Patient with fever at 101.1.  Blood cultures ordered.  UA and respiratory panel ordered.  Exactly  unsure of the etiology but septic joint is lower on my differential currently but if continues to have fever will be higher on the differential. Holding APAP to monitor fever trend. If other workup is negative.  Orthopedic Surgery consulted and recommending aspiration and holding on antibiotics until this can be obtained   Essential hypertension: On losartan  50 mg, will continue..  Blood pressure is elevated here in 160s over 90s.  Will use IV labetalol prn.   Type 2 diabetes: Not on any pharmacotherapy.  Will start SSI. CBG Trend:  Recent Labs  Lab 12/21/24 2145 12/22/24 0730 12/22/24 1116 12/22/24 1630 12/22/24 1729  GLUCAP 190* 112* 78 69* 114*    Rheumatoid arthritis/history of SLE: Sees rheumatology outpatient.  Outpatient records states patient should be on sulfasalazine  500 mg twice daily.   Insomnia: Trazodone  100 mg as needed at bedtime.   Palpitations: on atenolol as needed for palpitations  Normocytic Anemia: Hgb/Hct Trend:  Recent Labs  Lab 12/21/24 1209 12/22/24 0541  HGB 11.7* 11.8*  HCT 36.7 37.1  MCV 93.1 92.5  -Check Anemia Panel in the AM. CTM for S/Sx of Bleeding; No overt bleeding noted -Repeat CBC in the AM   Overweight: Complicates overall prognosis and care. Estimated body mass index is 27.39 kg/m as calculated from the following:   Height as of 12/20/24: 5' 10 (1.778 m).   Weight as of this encounter: 86.6  kg. Weight Loss and Dietary Counseling given   DVT prophylaxis: heparin  injection 5,000 Units Start: 12/21/24 2200    Code Status: Full Code Family Communication: D/w Family present @ bedside   Disposition Plan:  Level of care: Med-Surg Status is: Inpatient Remains inpatient appropriate because: Needs further clinical improvement and further evaluation by the orthopedic team and IR team.  She will need to be seen by PT OT to determine safe discharge disposition   Consultants:  Orthopedic Surgery IR  Procedures:  As delineated as  above  Antimicrobials:  Anti-infectives (From admission, onward)    None       Subjective: Seen and examined at bedside and was complaining about right hip pain.  States that she was febrile yesterday but was not febrile prior to coming to the hospital.  No nausea or vomiting.  Denied lightheadedness or dizziness.  Thinks her pain is doing okay.  No other concerns or complaints at this time  Objective: Vitals:   12/22/24 0057 12/22/24 0500 12/22/24 1316 12/22/24 1932  BP: (!) 151/85  (!) 156/84 (!) 163/90  Pulse: 99  98 (!) 103  Resp: 18  16 16   Temp: 98.7 F (37.1 C)  99.1 F (37.3 C) (!) 100.4 F (38 C)  TempSrc: Oral   Oral  SpO2: 95%  95% 97%  Weight:  86.6 kg      Intake/Output Summary (Last 24 hours) at 12/22/2024 2048 Last data filed at 12/22/2024 1714 Gross per 24 hour  Intake 240 ml  Output 650 ml  Net -410 ml   Filed Weights   12/22/24 0500  Weight: 86.6 kg   Examination: Physical Exam:  Constitutional: WN/WD, overweight African-American female in no acute distress Respiratory: Diminished to auscultation bilaterally, no wheezing, rales, rhonchi or crackles. Normal respiratory effort and patient is not tachypenic. No accessory muscle use.  Unlabored breathing Cardiovascular: RRR, no murmurs / rubs / gallops. S1 and S2 auscultated. No extremity edema. Abdomen: Soft, non-tender, distended secondary to body habitus. Bowel sounds positive.  GU: Deferred. Musculoskeletal: No clubbing / cyanosis of digits/nails. No joint deformity upper and lower extremities.  Skin: No rashes, lesions, ulcers on limited skin evaluation. No induration; Warm and dry.  Neurologic: CN 2-12 grossly intact with no focal deficits. Romberg sign and cerebellar reflexes not assessed.  Psychiatric: Normal judgment and insight. Alert and oriented x 3. Normal mood and appropriate affect.   Data Reviewed: I have personally reviewed following labs and imaging studies  CBC: Recent Labs  Lab  12/21/24 1209 12/22/24 0541  WBC 10.3 7.1  HGB 11.7* 11.8*  HCT 36.7 37.1  MCV 93.1 92.5  PLT 217 210   Basic Metabolic Panel: Recent Labs  Lab 12/21/24 1209 12/21/24 1922 12/22/24 0541  NA 136  --  138  K 3.8  --  3.9  CL 106  --  105  CO2 22  --  23  GLUCOSE 123*  --  116*  BUN 14  --  10  CREATININE 0.77  --  0.78  CALCIUM 9.4  --  9.4  MG  --  2.0  --    GFR: Estimated Creatinine Clearance: 91.6 mL/min (by C-G formula based on SCr of 0.78 mg/dL). Liver Function Tests: No results for input(s): AST, ALT, ALKPHOS, BILITOT, PROT, ALBUMIN in the last 168 hours. No results for input(s): LIPASE, AMYLASE in the last 168 hours. No results for input(s): AMMONIA in the last 168 hours. Coagulation Profile: No results for input(s): INR, PROTIME  in the last 168 hours. Cardiac Enzymes: No results for input(s): CKTOTAL, CKMB, CKMBINDEX, TROPONINI in the last 168 hours. BNP (last 3 results) No results for input(s): PROBNP in the last 8760 hours. HbA1C: No results for input(s): HGBA1C in the last 72 hours. CBG: Recent Labs  Lab 12/21/24 2145 12/22/24 0730 12/22/24 1116 12/22/24 1630 12/22/24 1729  GLUCAP 190* 112* 78 69* 114*   Lipid Profile: No results for input(s): CHOL, HDL, LDLCALC, TRIG, CHOLHDL, LDLDIRECT in the last 72 hours. Thyroid Function Tests: No results for input(s): TSH, T4TOTAL, FREET4, T3FREE, THYROIDAB in the last 72 hours. Anemia Panel: No results for input(s): VITAMINB12, FOLATE, FERRITIN, TIBC, IRON, RETICCTPCT in the last 72 hours. Sepsis Labs: No results for input(s): PROCALCITON, LATICACIDVEN in the last 168 hours.  Recent Results (from the past 240 hours)  Resp panel by RT-PCR (RSV, Flu A&B, Covid) Anterior Nasal Swab     Status: None   Collection Time: 12/21/24  6:19 PM   Specimen: Anterior Nasal Swab  Result Value Ref Range Status   SARS Coronavirus 2 by RT PCR NEGATIVE  NEGATIVE Final    Comment: (NOTE) SARS-CoV-2 target nucleic acids are NOT DETECTED.  The SARS-CoV-2 RNA is generally detectable in upper respiratory specimens during the acute phase of infection. The lowest concentration of SARS-CoV-2 viral copies this assay can detect is 138 copies/mL. A negative result does not preclude SARS-Cov-2 infection and should not be used as the sole basis for treatment or other patient management decisions. A negative result may occur with  improper specimen collection/handling, submission of specimen other than nasopharyngeal swab, presence of viral mutation(s) within the areas targeted by this assay, and inadequate number of viral copies(<138 copies/mL). A negative result must be combined with clinical observations, patient history, and epidemiological information. The expected result is Negative.  Fact Sheet for Patients:  bloggercourse.com  Fact Sheet for Healthcare Providers:  seriousbroker.it  This test is no t yet approved or cleared by the [place]  FDA and  has been authorized for detection and/or diagnosis of SARS-CoV-2 by FDA under an Emergency Use Authorization (EUA). This EUA will remain  in effect (meaning this test can be used) for the duration of the COVID-19 declaration under Section 564(b)(1) of the Act, 21 U.S.C.section 360bbb-3(b)(1), unless the authorization is terminated  or revoked sooner.       Influenza A by PCR NEGATIVE NEGATIVE Final   Influenza B by PCR NEGATIVE NEGATIVE Final    Comment: (NOTE) The Xpert Xpress SARS-CoV-2/FLU/RSV plus assay is intended as an aid in the diagnosis of influenza from Nasopharyngeal swab specimens and should not be used as a sole basis for treatment. Nasal washings and aspirates are unacceptable for Xpert Xpress SARS-CoV-2/FLU/RSV testing.  Fact Sheet for Patients: bloggercourse.com  Fact Sheet for Healthcare  Providers: seriousbroker.it  This test is not yet approved or cleared by the [place]  FDA and has been authorized for detection and/or diagnosis of SARS-CoV-2 by FDA under an Emergency Use Authorization (EUA). This EUA will remain in effect (meaning this test can be used) for the duration of the COVID-19 declaration under Section 564(b)(1) of the Act, 21 U.S.C. section 360bbb-3(b)(1), unless the authorization is terminated or revoked.     Resp Syncytial Virus by PCR NEGATIVE NEGATIVE Final    Comment: (NOTE) Fact Sheet for Patients: bloggercourse.com  Fact Sheet for Healthcare Providers: seriousbroker.it  This test is not yet approved or cleared by the [place]  FDA and has been authorized for detection and/or  diagnosis of SARS-CoV-2 by FDA under an Emergency Use Authorization (EUA). This EUA will remain in effect (meaning this test can be used) for the duration of the COVID-19 declaration under Section 564(b)(1) of the Act, 21 U.S.C. section 360bbb-3(b)(1), unless the authorization is terminated or revoked.  Performed at [facility], [address], [place], [place] [address]   Culture, blood (Routine X 2) w Reflex to ID Panel     Status: None (Preliminary result)   Collection Time: 12/21/24  7:29 PM   Specimen: BLOOD RIGHT ARM  Result Value Ref Range Status   Specimen Description   Final    BLOOD RIGHT ARM Performed at [facility], [address], [place], [place] [address]    Special Requests   Final    BOTTLES DRAWN AEROBIC AND ANAEROBIC Blood Culture adequate volume Performed at [facility], [address], [place], [place] [address]    Culture   Final    NO GROWTH < 24 HOURS Performed at [facility], [address], [place], [place] [address]    Report Status PENDING  Incomplete  Culture, blood (Routine X 2) w Reflex to ID Panel      Status: None (Preliminary result)   Collection Time: 12/21/24  7:36 PM   Specimen: BLOOD RIGHT FOREARM  Result Value Ref Range Status   Specimen Description   Final    BLOOD RIGHT FOREARM Performed at [facility], [address], [place], [place] [address]    Special Requests   Final    BOTTLES DRAWN AEROBIC AND ANAEROBIC Blood Culture adequate volume Performed at [facility], [address] [place], [place] [address]    Culture   Final    NO GROWTH < 24 HOURS Performed at [facility], [address] [place], [place] [address]    Report Status PENDING  Incomplete    Radiology Studies: CT PELVIS WO CONTRAST Result Date: 12/21/2024 CLINICAL DATA:  Pelvic fracture. EXAM: CT PELVIS WITHOUT CONTRAST TECHNIQUE: Multidetector CT imaging of the pelvis was performed following the standard protocol without intravenous contrast. RADIATION DOSE REDUCTION: This exam was performed according to the departmental dose-optimization program which includes automated exposure control, adjustment of the mA and/or kV according to patient size and/or use of iterative reconstruction technique. COMPARISON:  X-ray 12/20/2024 FINDINGS: Urinary Tract:  No abnormality visualized. Bowel:  Unremarkable visualized pelvic bowel loops. Vascular/Lymphatic: No pathologically enlarged lymph nodes. No significant vascular abnormality seen. Reproductive:  Hysterectomy.  There is no adnexal mass. Other:  No intraperitoneal free fluid. Musculoskeletal: SI joints and symphysis pubis unremarkable. No hip dislocation. Degenerative changes are noted in the hips, left greater than right. No femoral neck fracture. No sacral fracture. Small joint effusions evident, right greater than left IMPRESSION: 1. No evidence for an acute fracture in the bony anatomy of the pelvis. No femoral neck fracture. 2. Degenerative changes in the hips, left greater than right. 3. Small joint effusions in the hips, right  greater than left. Electronically Signed   By: Camellia Candle M.D.   On: 12/21/2024 12:06   CT Lumbar Spine Wo Contrast Result Date: 12/21/2024 EXAM: CT OF THE LUMBAR SPINE WITHOUT CONTRAST 12/21/2024 11:24:57 AM TECHNIQUE: CT of the lumbar spine was performed without the administration of intravenous contrast. Multiplanar reformatted images are provided for review. Automated exposure control, iterative reconstruction, and/or weight based adjustment of the mA/kV was utilized to reduce the radiation dose to  as low as reasonably achievable. COMPARISON: CT abdomen and pelvis 01/22/2023. CLINICAL HISTORY: 58 year old female. Low back pain, increased fracture risk. FINDINGS: BONES AND ALIGNMENT: Normal lumbar segmentation. Normal lordosis. No significant scoliosis or spondylolisthesis. Intact visible sacrum and SI joints. Normal vertebral body heights. No acute osseous abnormality. Background marrow signal is normal. SOFT TISSUES: Negative lumbar paraspinal soft tissues. Chronic cholecystectomy. Moderate calcified atherosclerosis of the abdominal aorta, and especially at the aortoiliac bifurcation. Stable other visible non-contrast abdominal viscera. DEGENERATIVE CHANGES: Lower thoracic facet hypertrophy at T11-T12 and T12-L1. No lower thoracic spinal stenosis. L1-L2: disc bulging  with mild posterior element hypertrophy. No spinal stenosis. L2-L3: Similar circumferential disc bulging and mild facet hypertrophy with mild superimposed epidural lipomatosis (series 11 image 60). Mild spinal stenosis. L3-L4: More pronounced disc space loss and bulky circumferential disc bulge. Mild to moderate posterior element hypertrophy. Mild spinal stenosis. Moderate bilateral L3 neural foraminal stenosis. L4-L5: Similar bulky circumferential disc bulge, moderate to severe facet and ligamentum flavum hypertrophy. Moderate to severe spinal stenosis (series 11 image 93). Moderate bilateral L4 neural foraminal stenosis. L5-S1:  Circumferential disc bulging asymmetric to the left (series 11 image 106). Moderate facet hypertrophy. No significant spinal stenosis. Mild to moderate right L5 neural foraminal stenosis. IMPRESSION: 1. No acute osseous abnormality in the lumbar spine. 2. Degenerative changes, worst at L4-L5 with moderate-severe multifactorial spinal stenosis. And multilevel up to moderate lumbar neural foraminal stenosis. Electronically signed by: Helayne Hurst MD 12/21/2024 11:47 AM EST RP Workstation: [id number]   Scheduled Meds:  heparin   5,000 Units Subcutaneous Q8H   insulin  aspart  0-5 Units Subcutaneous QHS   insulin  aspart  0-9 Units Subcutaneous TID WC   sodium chloride  flush  3 mL Intravenous Q12H   Continuous Infusions:   LOS: 0 days   Alejandro Marker, DO [facility] Available via Epic secure chat 7am-7pm After these hours, please refer to coverage provider listed on [email or web address] 12/22/2024, 8:48 PM  "

## 2024-12-22 NOTE — Consult Note (Signed)
 Orthopedic Consult  Patient ID: ZYKERIAH MATHIA MRN: [id number] DOB/AGE: [birth year] 58 y.o.  Reason for Consult: Left hip pain Referring Physician: Sherrill  HPI: Shelley Morrison is an 58 y.o. female who presents with concern of primary right hip pain.  This started approximately 2 days ago with no specific incident.  She notes she was having lunch when she got up to start walking after lunch.  The pain was sharp and in the right groin.  Since that time she notes that when she is laying in bed she does not have significant pain but anytime she tries to move or walk the pain is severe.  She does have a history of rheumatoid arthritis but has not had any flareups in her hips before.  She is not on any immunosuppressive medications for her rheumatoid arthritis.  She did have a fever upon admission to the hospital but denies feeling febrile prior to admission.  She denies any new areas of joint pain.  Denies any other signs of infection other than the fever on admission.  She denies any trauma or injuries to her hip.  Past Medical History:  Diagnosis Date   Diabetes type 2 (HCC) 08/12/2024   Elevated LFTs    Follows w/ [facility].   Fatty liver    Follows with [facility].   History of adenomatous polyp of colon    Hypertension    Follows w/ [facility].   Lupus 2018   Follows with [facility].   Rheumatoid arthritis (HCC)    seropositive rheumatoid arthritis, follows with [facility]   Wears contact lenses    Wears dentures    full set   Wears glasses     Past Surgical History:  Procedure Laterality Date   COLONOSCOPY  12/21/2022   with [facility]   LAPAROSCOPIC CHOLECYSTECTOMY  1997   ROBOTIC ASSISTED LAPAROSCOPIC HYSTERECTOMY AND SALPINGECTOMY Bilateral 04/30/2023   Procedure: ABORTED XI ROBOTIC ASSISTED LAPAROSCOPIC HYSTERECTOMY AND SALPINGECTOMY;  Surgeon: Darcel Pool, MD;  Location: [facility];  Service: Gynecology;  Laterality: Bilateral;    VAGINAL HYSTERECTOMY N/A 04/30/2023   Procedure: HYSTERECTOMY VAGINAL WITH MYOMECTOMY AND  BILATERAL SALPINGECTOMY;  Surgeon: Darcel Pool, MD;  Location: [facility];  Service: Gynecology;  Laterality: N/A;    Family History  Problem Relation Age of Onset   Healthy Son    Healthy Daughter     Social History:  reports that she quit smoking about 20 years ago. Her smoking use included cigarettes. She started smoking about 30 years ago. She has a 7.5 pack-year smoking history. She has never been exposed to tobacco smoke. She has never used smokeless tobacco. She reports that she does not currently use alcohol. She reports that she does not use drugs.  Allergies: Allergies[1]  Medications: I have reviewed the patient's current medications.    Exam: Blood pressure (!) 151/85, pulse 99, temperature 98.7 F (37.1 C), temperature source Oral, resp. rate 18, weight 86.6 kg, last menstrual period 09/14/2017, SpO2 95%. General: Well-appearing woman, no acute distress Orientation: Alert and oriented Mood and Affect: Mood is calm   Injured Extremity (CV, lymph, sensation, reflexes): Right leg is held extended and not flexed.  She has some mild tenderness about the medial joint line of the right knee but overall no swelling or specific tenderness on the lateral side.  No tense about the ankle.  She is intact sensation in the saphenous, sural, tibial, and peroneal nerve distributions.  5/5 strength EHL, FHL, gastrocs, tibialis anterior.  Significant pain with any range of motion of the hip. . Examination of her left lower extremity embezzled bilateral upper extremities and attends outpatient no crepitus defects or deformities no pain with range of motion of the joints.    Medical Decision Making: Data: Imaging: CT scan of the pelvis reveals no acute fractures.  There is degenerative changes in both hips left greater than right.  There is small effusions both hips right greater than  left  ET scan lumbar spine reveals mild degenerative changes with moderate spinal stenosis at L4-5  Labs: Copland cell count 7.1, hemoglobin 0.8, hematocrit 37.1, sed rate is 47, C-reactive protein is 6.2  Imaging or Labs ordered: Aspiration of right hip ordered with [facility]  Medical history and chart was reviewed and case discussed with medical provider.  Assessment/Plan: Right hip pain with effusion  The patient does have severe pain with range of motion of her right hip.  The differential for this could include osteoarthritis with an acute flareup of her arthritis, a flareup of her rheumatoid arthritis, a strain to the hip, or the possibly of infection.  Given the effusion as well as the inflammatory markers, I am concerned about the possibly of an infection however the inflammatory markers are not overwhelming elevated so this could be more of a reaction to her rheumatoid arthritis.  At this point I agree with the recommendation for an aspiration.  Will attempt to get this done through radiology soon as possible.  I will continue to hold off on antibiotics until this can be obtained.  In the meantime we will continue with pain control for her.  She may weight-bear if she can tolerate it, although this may be difficult with her pain.  Will continue to follow the patient await the results of the aspiration.  New problem w/ workup planned: High complexity diagnosis (Level 5) Surgery w/ risks or Emergency surgery: High complexity Risk (Level 5)  All others are Level 4 with comprehensive musculoskeletal exam.  Cordella Rhein, MD, MS [facility] / [place] [phone]      [1]  Allergies Allergen Reactions   Acetaminophen -Codeine Other (See Comments)    Causes flank pain per patient.SABRA

## 2024-12-22 NOTE — Plan of Care (Signed)

## 2024-12-22 NOTE — Plan of Care (Signed)

## 2024-12-23 ENCOUNTER — Inpatient Hospital Stay (HOSPITAL_COMMUNITY)

## 2024-12-23 DIAGNOSIS — I10 Essential (primary) hypertension: Secondary | ICD-10-CM | POA: Diagnosis not present

## 2024-12-23 DIAGNOSIS — R52 Pain, unspecified: Secondary | ICD-10-CM | POA: Diagnosis not present

## 2024-12-23 DIAGNOSIS — M329 Systemic lupus erythematosus, unspecified: Secondary | ICD-10-CM | POA: Diagnosis not present

## 2024-12-23 DIAGNOSIS — F5101 Primary insomnia: Secondary | ICD-10-CM | POA: Diagnosis not present

## 2024-12-23 HISTORY — PX: IR FLUORO GUIDED NEEDLE PLC ASPIRATION/INJECTION LOC: IMG2395

## 2024-12-23 LAB — COMPREHENSIVE METABOLIC PANEL WITH GFR
ALT: 27 U/L (ref 0–44)
AST: 26 U/L (ref 15–41)
Albumin: 3.6 g/dL (ref 3.5–5.0)
Alkaline Phosphatase: 67 U/L (ref 38–126)
Anion gap: 10 (ref 5–15)
BUN: 9 mg/dL (ref 6–20)
CO2: 27 mmol/L (ref 22–32)
Calcium: 9.9 mg/dL (ref 8.9–10.3)
Chloride: 101 mmol/L (ref 98–111)
Creatinine, Ser: 0.74 mg/dL (ref 0.44–1.00)
GFR, Estimated: >60 mL/min
Glucose, Bld: 84 mg/dL (ref 70–99)
Potassium: 3.8 mmol/L (ref 3.5–5.1)
Sodium: 138 mmol/L (ref 135–145)
Total Bilirubin: 0.5 mg/dL (ref 0.0–1.2)
Total Protein: 7.7 g/dL (ref 6.5–8.1)

## 2024-12-23 LAB — GLUCOSE, CAPILLARY
Glucose-Capillary: 101 mg/dL — ABNORMAL HIGH (ref 70–99)
Glucose-Capillary: 99 mg/dL (ref 70–99)
Glucose-Capillary: 127 mg/dL — ABNORMAL HIGH (ref 70–99)
Glucose-Capillary: 152 mg/dL — ABNORMAL HIGH (ref 70–99)

## 2024-12-23 LAB — CBC WITH DIFFERENTIAL/PLATELET
Abs Immature Granulocytes: 0.04 K/uL (ref 0.00–0.07)
Basophils Absolute: 0 K/uL (ref 0.0–0.1)
Basophils Relative: 1 %
Eosinophils Absolute: 0 K/uL (ref 0.0–0.5)
Eosinophils Relative: 0 %
HCT: 37.1 % (ref 36.0–46.0)
Hemoglobin: 11.8 g/dL — ABNORMAL LOW (ref 12.0–15.0)
Immature Granulocytes: 1 %
Lymphocytes Relative: 30 %
Lymphs Abs: 2.2 K/uL (ref 0.7–4.0)
MCH: 29.4 pg (ref 26.0–34.0)
MCHC: 31.8 g/dL (ref 30.0–36.0)
MCV: 92.3 fL (ref 80.0–100.0)
Monocytes Absolute: 1 K/uL (ref 0.1–1.0)
Monocytes Relative: 14 %
Neutro Abs: 3.9 K/uL (ref 1.7–7.7)
Neutrophils Relative %: 54 %
Platelets: 216 K/uL (ref 150–400)
RBC: 4.02 MIL/uL (ref 3.87–5.11)
RDW: 13.3 % (ref 11.5–15.5)
WBC: 7.1 K/uL (ref 4.0–10.5)
nRBC: 0 % (ref 0.0–0.2)

## 2024-12-23 LAB — IRON AND TIBC
Iron: 26 ug/dL — ABNORMAL LOW (ref 28–170)
Saturation Ratios: 10 % — ABNORMAL LOW (ref 10.4–31.8)
TIBC: 256 ug/dL (ref 250–450)
UIBC: 230 ug/dL

## 2024-12-23 LAB — RETICULOCYTES
Immature Retic Fract: 11.7 % (ref 2.3–15.9)
RBC.: 3.97 MIL/uL (ref 3.87–5.11)
Retic Count, Absolute: 48.8 K/uL (ref 19.0–186.0)
Retic Ct Pct: 1.2 % (ref 0.4–3.1)

## 2024-12-23 LAB — PHOSPHORUS: Phosphorus: 2.8 mg/dL (ref 2.5–4.6)

## 2024-12-23 LAB — VITAMIN B12: Vitamin B-12: 462 pg/mL (ref 180–914)

## 2024-12-23 LAB — FOLATE: Folate: 7.5 ng/mL

## 2024-12-23 LAB — FERRITIN: Ferritin: 438 ng/mL — ABNORMAL HIGH (ref 11–307)

## 2024-12-23 LAB — MAGNESIUM: Magnesium: 2.5 mg/dL — ABNORMAL HIGH (ref 1.7–2.4)

## 2024-12-23 MED ORDER — LIDOCAINE HCL 1 % IJ SOLN
20.0000 mL | Freq: Once | INTRAMUSCULAR | Status: AC
  Administered 2024-12-23: 7 mL via INTRADERMAL
  Filled 2024-12-23: qty 20

## 2024-12-23 MED ORDER — HEPARIN SODIUM (PORCINE) 5000 UNIT/ML IJ SOLN
5000.0000 [IU] | Freq: Three times a day (TID) | INTRAMUSCULAR | Status: DC
  Administered 2024-12-23 – 2024-12-26 (×8): 5000 [IU] via SUBCUTANEOUS
  Filled 2024-12-23 (×7): qty 1

## 2024-12-23 MED ORDER — LOSARTAN POTASSIUM 50 MG PO TABS
50.0000 mg | ORAL_TABLET | Freq: Every day | ORAL | Status: DC
  Administered 2024-12-23 – 2024-12-26 (×4): 50 mg via ORAL
  Filled 2024-12-23 (×4): qty 1

## 2024-12-23 MED ORDER — LIDOCAINE HCL 1 % IJ SOLN
INTRAMUSCULAR | Status: AC
  Filled 2024-12-23: qty 20

## 2024-12-23 MED ORDER — SULFASALAZINE 500 MG PO TABS
500.0000 mg | ORAL_TABLET | Freq: Two times a day (BID) | ORAL | Status: DC
  Administered 2024-12-23 – 2024-12-26 (×6): 500 mg via ORAL
  Filled 2024-12-23 (×5): qty 1

## 2024-12-23 MED ORDER — PROCHLORPERAZINE EDISYLATE 10 MG/2ML IJ SOLN: 10.0000 mg | Freq: Four times a day (QID) | INTRAMUSCULAR | Status: DC | PRN

## 2024-12-23 NOTE — Progress Notes (Signed)
 " PROGRESS NOTE    Shelley Morrison  FMW:[id number] DOB: [birth year] DOA: 12/21/2024 PCP: Dyane Anthony RAMAN, FNP   Brief Narrative:  Shelley Morrison is a 58 y.o. year old female with medical history of hypertension, rheumatoid arthritis, insomnia presenting to the ED with right hip pain.  Pt states around 11 am yesterday she was walking and developed right hip pain. This continued to get worse. She describes it as sharp pain and states it is localized to the hip. It is severe up to 10/10. States she has not had any fevers or chills but was told she had a fever when she came here. Denies other symptoms on ROS. Denies any URI or UTI symptoms. She states she takes her medications regularly.   On arrival to the ED patient was noted to be HDS stable.  Lab work and imaging obtained.  CBC without leukocytosis and mild anemia near baseline.  ESR is 47, CRP 6.2.  CT pelvis shows no acute fractures but shows degenerative changes in the hip left greater than right with small effusion in the hip right greater than left.  CT lumbar spine showed multilevel degenerative disc disease and moderate to severe category.  Given this, [facility] contacted for admission.  Ortho consulted and recommending holding Abx until Aspiration. IR aspirated and pending results. Had a slight Temp last night but will continue to hold on placing Abx.   Assessment and Plan:   Right Hip Pain: Patient patient with right hip severe pain. Pt admitted for pain control. Ddx include OA vs RA flare vs septic joint. Imaging shows arthritis in both hips. Effusions can be seen in OA and RA. Will consult orthopedic surgery and hold off Abx until aspiration. CRP was 6.2 and ESR was 47. Will start multimodal pain control. Started on oxycodone  5 mg q6hrs prn. IV Dilaudid  1 mg q2hrs for breakthrough pain.  Normal renal function and no hx of PUD noted. Started robaxin  500 mg q4hrs. PT and OT consulted. IR aspirated Hip and showed Abundant WBC but no Organisms seen with  Cx and Sensitivities pending.    Febrile Episode: Patient with fever at 101.1 on admission and had another febrile episode of 100.4.  Blood cultures ordered and showing NGTD @ 2 days. UA Negaive. Respiratory panel ordered.  Exactly unsure of the etiology but septic joint is lower on my differential currently but if continues to have fever will be higher on the differential. Holding APAP to monitor fever trend. If other workup is negative.  Orthopedic Surgery consulted and recommending aspiration and holding on antibiotics until this can be obtained; Aspiration done and pending results. Continue to Hold off Abx   Essential Hypertension: On losartan  50 mg and resumed. CTM BP per Protocol. Will use IV labetalol prn. Last BP reading was 145/82   Type 2 diabetes: Not on any pharmacotherapy.  Will start SSI. CBG Trend:  Recent Labs  Lab 12/22/24 1116 12/22/24 1630 12/22/24 1729 12/22/24 2119 12/23/24 0742 12/23/24 1131 12/23/24 1611  GLUCAP 78 69* 114* 116* 101* 99 127*  -HbA1c is now 6.2. CTM CBG's per Protocol   Rheumatoid arthritis/history of SLE: Sees rheumatology outpatient.  Outpatient records states patient should be on Sulfasalazine  500 mg twice daily and will resume now   Insomnia: No longer taking Trazodone  100 mg as needed at bedtime.   Palpitations: No longer on Atenolol as needed for palpitations  Normocytic Anemia: Hgb/Hct Trend:  Recent Labs  Lab 12/21/24 1209 12/22/24 0541 12/23/24 0505  HGB 11.7* 11.8* 11.8*  HCT 36.7 37.1 37.1  MCV 93.1 92.5 92.3  -Checked Anemia Panel and showed an iron level of 26, UIBC of 230, TIBC of 256, ferritin level of 438, folate level 7.5 and vitamin B12 462. CTM for S/Sx of Bleeding; No overt bleeding noted -Repeat CBC in the AM   Overweight: Complicates overall prognosis and care. Estimated body mass index is 27.39 kg/m as calculated from the following:   Height as of 12/20/24: 5' 10 (1.778 m).   Weight as of this encounter: 86.6 kg.  Weight Loss and Dietary Counseling given   DVT prophylaxis: heparin  injection 5,000 Units Start: 12/23/24 2200    Code Status: Full Code Family Communication: No family present @ bedside  Disposition Plan:  Level of care: Med-Surg Status is: Inpatient Remains inpatient appropriate because: Needs further clinical improvement and clearance by the specialists   Consultants:  Orthopedic Surgery IR  Procedures:  As delineated as above  Antimicrobials:  Anti-infectives (From admission, onward)    None       Subjective: Seen and examined at bedside and had just come back from a hip aspiration was complaining of some soreness.  Thinks her hip is doing a bit better today.  Spiking a temperature last night.  No nausea or vomiting.  Denies any lightheadedness or dizziness.  No other concerns or complaints this time.  Objective: Vitals:   12/22/24 1932 12/23/24 0500 12/23/24 0502 12/23/24 1134  BP: (!) 163/90  (!) 157/94 (!) 145/82  Pulse: (!) 103  93 95  Resp: 16  17   Temp: (!) 100.4 F (38 C)  99.5 F (37.5 C) 98.3 F (36.8 C)  TempSrc: Oral  Oral   SpO2: 97%  93% 94%  Weight:  86.6 kg      Intake/Output Summary (Last 24 hours) at 12/23/2024 1933 Last data filed at 12/23/2024 1215 Gross per 24 hour  Intake 603 ml  Output --  Net 603 ml   Filed Weights   12/22/24 0500 12/23/24 0500  Weight: 86.6 kg 86.6 kg   Examination: Physical Exam:  Constitutional: WN/WD overweight AAF in NAD appears calm Respiratory: Diminished to auscultation bilaterally, no wheezing, rales, rhonchi or crackles. Normal respiratory effort and patient is not tachypenic. No accessory muscle use. Unlabored breathing Cardiovascular: RRR, no murmurs / rubs / gallops. S1 and S2 auscultated. No extremity edema. Abdomen: Soft, non-tender, Distended 2/2 body habitus Bowel sounds positive.  GU: Deferred. Musculoskeletal: No clubbing / cyanosis of digits/nails. No joint deformity upper and lower  extremities.  Skin: No rashes, lesions, ulcers on a limited skin evaluation. No induration; Warm and dry.  Neurologic: CN 2-12 grossly intact with no focal deficits. Romberg sign and cerebellar reflexes not assessed.  Psychiatric: Normal judgment and insight. Alert and oriented x 3. Normal mood and appropriate affect.   Data Reviewed: I have personally reviewed following labs and imaging studies  CBC: Recent Labs  Lab 12/21/24 1209 12/22/24 0541 12/23/24 0505  WBC 10.3 7.1 7.1  NEUTROABS  --   --  3.9  HGB 11.7* 11.8* 11.8*  HCT 36.7 37.1 37.1  MCV 93.1 92.5 92.3  PLT 217 210 216   Basic Metabolic Panel: Recent Labs  Lab 12/21/24 1209 12/21/24 1922 12/22/24 0541 12/23/24 0505  NA 136  --  138 138  K 3.8  --  3.9 3.8  CL 106  --  105 101  CO2 22  --  23 27  GLUCOSE 123*  --  116* 84  BUN 14  --  10 9  CREATININE 0.77  --  0.78 0.74  CALCIUM 9.4  --  9.4 9.9  MG  --  2.0  --  2.5*  PHOS  --   --   --  2.8   GFR: Estimated Creatinine Clearance: 91.6 mL/min (by C-G formula based on SCr of 0.74 mg/dL). Liver Function Tests: Recent Labs  Lab 12/23/24 0505  AST 26  ALT 27  ALKPHOS 67  BILITOT 0.5  PROT 7.7  ALBUMIN 3.6   No results for input(s): LIPASE, AMYLASE in the last 168 hours. No results for input(s): AMMONIA in the last 168 hours. Coagulation Profile: No results for input(s): INR, PROTIME in the last 168 hours. Cardiac Enzymes: No results for input(s): CKTOTAL, CKMB, CKMBINDEX, TROPONINI in the last 168 hours. BNP (last 3 results) No results for input(s): PROBNP in the last 8760 hours. HbA1C: No results for input(s): HGBA1C in the last 72 hours. CBG: Recent Labs  Lab 12/22/24 1729 12/22/24 2119 12/23/24 0742 12/23/24 1131 12/23/24 1611  GLUCAP 114* 116* 101* 99 127*   Lipid Profile: No results for input(s): CHOL, HDL, LDLCALC, TRIG, CHOLHDL, LDLDIRECT in the last 72 hours. Thyroid Function Tests: No results  for input(s): TSH, T4TOTAL, FREET4, T3FREE, THYROIDAB in the last 72 hours. Anemia Panel: Recent Labs    12/23/24 0505  VITAMINB12 462  FOLATE 7.5  FERRITIN 438*  TIBC 256  IRON 26*  RETICCTPCT 1.2   Sepsis Labs: No results for input(s): PROCALCITON, LATICACIDVEN in the last 168 hours.  Recent Results (from the past 240 hours)  Resp panel by RT-PCR (RSV, Flu A&B, Covid) Anterior Nasal Swab     Status: None   Collection Time: 12/21/24  6:19 PM   Specimen: Anterior Nasal Swab  Result Value Ref Range Status   SARS Coronavirus 2 by RT PCR NEGATIVE NEGATIVE Final    Comment: (NOTE) SARS-CoV-2 target nucleic acids are NOT DETECTED.  The SARS-CoV-2 RNA is generally detectable in upper respiratory specimens during the acute phase of infection. The lowest concentration of SARS-CoV-2 viral copies this assay can detect is 138 copies/mL. A negative result does not preclude SARS-Cov-2 infection and should not be used as the sole basis for treatment or other patient management decisions. A negative result may occur with  improper specimen collection/handling, submission of specimen other than nasopharyngeal swab, presence of viral mutation(s) within the areas targeted by this assay, and inadequate number of viral copies(<138 copies/mL). A negative result must be combined with clinical observations, patient history, and epidemiological information. The expected result is Negative.  Fact Sheet for Patients:  bloggercourse.com  Fact Sheet for Healthcare Providers:  seriousbroker.it  This test is no t yet approved or cleared by the [place]  FDA and  has been authorized for detection and/or diagnosis of SARS-CoV-2 by FDA under an Emergency Use Authorization (EUA). This EUA will remain  in effect (meaning this test can be used) for the duration of the COVID-19 declaration under Section 564(b)(1) of the Act,  21 U.S.C.section 360bbb-3(b)(1), unless the authorization is terminated  or revoked sooner.       Influenza A by PCR NEGATIVE NEGATIVE Final   Influenza B by PCR NEGATIVE NEGATIVE Final    Comment: (NOTE) The Xpert Xpress SARS-CoV-2/FLU/RSV plus assay is intended as an aid in the diagnosis of influenza from Nasopharyngeal swab specimens and should not be used as a sole basis for treatment. Nasal washings and aspirates are unacceptable for  Xpert Xpress SARS-CoV-2/FLU/RSV testing.  Fact Sheet for Patients: bloggercourse.com  Fact Sheet for Healthcare Providers: seriousbroker.it  This test is not yet approved or cleared by the [place]  FDA and has been authorized for detection and/or diagnosis of SARS-CoV-2 by FDA under an Emergency Use Authorization (EUA). This EUA will remain in effect (meaning this test can be used) for the duration of the COVID-19 declaration under Section 564(b)(1) of the Act, 21 U.S.C. section 360bbb-3(b)(1), unless the authorization is terminated or revoked.     Resp Syncytial Virus by PCR NEGATIVE NEGATIVE Final    Comment: (NOTE) Fact Sheet for Patients: bloggercourse.com  Fact Sheet for Healthcare Providers: seriousbroker.it  This test is not yet approved or cleared by the [place]  FDA and has been authorized for detection and/or diagnosis of SARS-CoV-2 by FDA under an Emergency Use Authorization (EUA). This EUA will remain in effect (meaning this test can be used) for the duration of the COVID-19 declaration under Section 564(b)(1) of the Act, 21 U.S.C. section 360bbb-3(b)(1), unless the authorization is terminated or revoked.  Performed at [facility], [address] [place], [place] [address]   Culture, blood (Routine X 2) w Reflex to ID Panel     Status: None (Preliminary result)   Collection Time: 12/21/24  7:29  PM   Specimen: BLOOD RIGHT ARM  Result Value Ref Range Status   Specimen Description   Final    BLOOD RIGHT ARM Performed at [facility], [address] [place], [place] [address]    Special Requests   Final    BOTTLES DRAWN AEROBIC AND ANAEROBIC Blood Culture adequate volume Performed at [facility], [address] [place], [place] [address]    Culture   Final    NO GROWTH 2 DAYS Performed at [facility], [address] [place], [place] [address]    Report Status PENDING  Incomplete  Culture, blood (Routine X 2) w Reflex to ID Panel     Status: None (Preliminary result)   Collection Time: 12/21/24  7:36 PM   Specimen: BLOOD RIGHT FOREARM  Result Value Ref Range Status   Specimen Description   Final    BLOOD RIGHT FOREARM Performed at [facility], [address], [place], [place] [address]    Special Requests   Final    BOTTLES DRAWN AEROBIC AND ANAEROBIC Blood Culture adequate volume Performed at [facility], [address], [place], [place] [address]    Culture   Final    NO GROWTH 2 DAYS Performed at [facility], [address], [place], [place] [address]    Report Status PENDING  Incomplete  Aerobic/Anaerobic Culture w Gram Stain (surgical/deep wound)     Status: None (Preliminary result)   Collection Time: 12/23/24 10:15 AM   Specimen: Joint, Right Hip; Tissue  Result Value Ref Range Status   Specimen Description   Final    JOINT FLUID Performed at [facility], [address] [place], [place] [address]    Special Requests   Final    RIGHT HIP Performed at [facility], [address] [place], [place] [address]    Gram Stain   Final    ABUNDANT WBC PRESENT, PREDOMINANTLY PMN NO ORGANISMS SEEN Performed at [facility], [address], [place], [place] [address]    Culture PENDING  Incomplete   Report Status PENDING  Incomplete  Radiology  Studies: IR Fluoro Guide Ndl Plmt / BX Result Date: 12/23/2024 INDICATION: Right hip pain of unknown etiology. CT pelvis 12/21/2024 demonstrates right hip joint effusion. EXAM: Fluoroscopy guided hip aspiration MEDICATIONS: None. COMPLICATIONS: None immediate. PROCEDURE: Informed written consent was obtained from the patient after a thorough discussion of the procedural risks, benefits and alternatives. All questions were addressed. Maximal Sterile Barrier Technique was utilized including caps, mask, sterile gowns, sterile gloves, sterile drape, hand hygiene and skin antiseptic. A timeout was performed prior to the initiation of the procedure. Fluoroscopy was used to visualize the right hip joint. Local anesthesia was achieved with 1% lidocaine . Lidocaine  was infiltrated from the skin down to the neck of the right femur. A spinal needle was then advanced under fluoroscopic guidance to the femoral head/neck junction. Approximately 7 mL of cloudy yellow fluid was aspirated. Aspirate sent to pathology for culture and sensitivity. Needle was withdrawn. IMPRESSION: Satisfactory aspiration of 7 mL from the right hip joint under fluoroscopic guidance. Electronically Signed   By: Cordella Banner   On: 12/23/2024 10:31   Scheduled Meds:  heparin   5,000 Units Subcutaneous Q8H   insulin  aspart  0-5 Units Subcutaneous QHS   insulin  aspart  0-9 Units Subcutaneous TID WC   losartan   50 mg Oral Daily   sulfaSALAzine   500 mg Oral BID   Continuous Infusions:   LOS: 1 day   Alejandro Marker, DO [facility] Available via Epic secure chat 7am-7pm After these hours, please refer to coverage provider listed on [email or web address] 12/23/2024, 7:33 PM  "

## 2024-12-23 NOTE — Plan of Care (Signed)

## 2024-12-23 NOTE — Evaluation (Signed)
 Occupational Therapy Evaluation Patient Details Name: Shelley Morrison MRN: [id number] DOB: 01/10/1966 Today's Date: 12/23/2024   History of Present Illness   57 yr old female who presented to the hospital on 12/21/24 due to right hip pain. CT pelvis showed no acute fractures but shows degenerative changes in hip L>R and small effusion in R hip; s/p aspiration during hospital stay. CT lumbar showed multi-level degenerative disc disease.   PMH includes but is not limited to: DM II, elevated LFTs, HTN, lupus, RA,  cholecystectomy and fatty liver.     Clinical Impressions The pt is currently presenting below her baseline level of functioning for self care management. She is normally independent with ADLs, working as a LAWYER, and ambulation. She is limited by the below listed deficits (see OT problem list). Her overall ADL performance and functional abilities are compromised, due to her increased proximal RLE pain, which radiates to her thigh; she describes pain as dull and aching, and occasionally stabbing. Due to her pain, she occasionally needed increased time and effort for progressive activity, as well as cues and instruction on pain minimization techniques and compensatory strategies for performing ADLs (see ADL section below). Today, she required min assist for supine to sit. Once seated EOB, she presented with guarding of her R hip, by demonstrating a left lateral lean. With regards to sit to stand, she was instructed on implementing RLE toe-touch weightbearing, as well as RLE non-weight-bearing, as full RLE weight-bearing exacerbates her pain; she was able to demonstrate both toe-touch weight-beating and non-weightbearing in standing while using a RW for support. She required min assist to ambulate a couple feet using a RW, demonstrating toe-touch weight-bearing, then min assist for hopping on her LLE for a couple feet using a RW. She reported 7/10 RLE pain with toe-touch weight-bearing and 5/10 RLE pain  with non-weightbearing. She was positioned in the bedside chair, however was unable to remain there due to pain; she was then assisted back to bed. OT anticipates her overall ADL performance and functional abilities will be much improved, should her pain decrease. The pt declines SNF rehab and plans to discharge to her sister's home who is a engineer, civil (consulting) and doesn't have stairs. Should she return home at discharge, OT recommends home health OT, family assistance, tub transfer bench vs. shower chair, a 3 in 1 bedside commode, and rolling walker. OT will continue to follow her for services in the acute care setting.        If plan is discharge home, recommend the following:   Help with stairs or ramp for entrance;Assist for transportation;Assistance with cooking/housework;A little help with walking and/or transfers;A lot of help with bathing/dressing/bathroom     Functional Status Assessment   Patient has had a recent decline in their functional status and demonstrates the ability to make significant improvements in function in a reasonable and predictable amount of time.     Equipment Recommendations   BSC/3in1;Tub/shower bench;Other (comment);Toilet rise with handles (Rolling walker)     Recommendations for Other Services         Precautions/Restrictions   Restrictions Weight Bearing Restrictions Per Provider Order: Yes RLE Weight Bearing Per Provider Order: Weight bearing as tolerated     Mobility Bed Mobility Overal bed mobility: Needs Assistance Bed Mobility: Supine to Sit, Sit to Supine     Supine to sit: Min assist, HOB elevated, Used rails Sit to supine: Min assist   General bed mobility comments: Required increased time and effort for bed mobility,  due to pain. Also, she needed assist for RLE management and cues for general body positioning and best transfer technique, to help minimize RLE pain    Transfers Overall transfer level: Needs assistance Equipment used:  Rolling walker (2 wheels) Transfers: Sit to/from Stand Sit to Stand: Min assist, From elevated surface           General transfer comment: Pt was instructed on implementing RLE toe-touch weightbearing, as well as RLE non-weight-bearing, as full RLE weight-bearing exacerbates her pain. She was able to demonstrate both toe-touch and non-weightbearing in standing while using a RW for support. She required min assist to ambulate a couple feet using a RW, demonstrating toe-touch weight-bearing, as well as hopping on her LLE for a couple feet using a RW. She reported 7/10 RLE pain with toe-touch weight-bearing and 5/10 RLE pain with non-weightbearing      Balance     Sitting balance-Leahy Scale: Fair Sitting balance - Comments: unable to sit comfortably due to pain, left lateral lean, decreased weight through R hip/pelvis       Standing balance comment: Min assist with RW          ADL either performed or assessed with clinical judgement   ADL Overall ADL's : Needs assistance/impaired Eating/Feeding: Independent;Sitting   Grooming: Set up;Sitting Grooming Details (indicate cue type and reason): supported sitting at chair level Upper Body Bathing: Set up;Sitting Upper Body Bathing Details (indicate cue type and reason): supported sitting at chair level Lower Body Bathing: Moderate assistance;Sitting/lateral leans;Cueing for compensatory techniques   Upper Body Dressing : Set up;Sitting Upper Body Dressing Details (indicate cue type and reason): supported sitting at chair level Lower Body Dressing: Moderate assistance;Cueing for compensatory techniques;With adaptive equipment;Sitting/lateral leans;Sit to/from stand   Toilet Transfer: Minimal assistance;Grab bars;Rolling walker (2 wheels);Ambulation;Cueing for sequencing Toilet Transfer Details (indicate cue type and reason): at bathroom level, based on clinical judgement; raised toilet surface Toileting- Clothing Manipulation and  Hygiene: Moderate assistance;Sit to/from stand;Cueing for compensatory techniques;Cueing for sequencing Toileting - Clothing Manipulation Details (indicate cue type and reason): at bathroom level, based on clinical judgement; raised toilet surface       General ADL Comments: The pt's overall ADL performance is compromised given her proximal RLE pain and minimal tolerance for flexing at the hips, as flexing exacerbates pain. As such, OT instructed her on using a reacher and sock aid to assist to lower body dressing tasks, specifically using a sock aid to donn socks, reacher to doff socks, and reacher to donn lower body clothing articles such as pants and underwear. She performed teach back on using a sock aid to donn socks and reacher to doff socks seated EOB. OT also provided verbal instructions with pictures/illustrations to educate the pt on using a tub bench for toilet transfers. OT further recommended the pt use a raised toilet surface to facilitate improved ease with transferring onto and off the toilet; options were provided for using a bedside commode frame placed over the toilet vs. using toilet rails/safety rails vs. a toilet riser with rails.     Vision Patient Visual Report: No change from baseline              Pertinent Vitals/Pain Pain Assessment Pain Assessment: 0-10 Pain Score: 7  Pain Location: R hip and thigh Pain Descriptors / Indicators: Aching, Dull Pain Intervention(s): Limited activity within patient's tolerance, Monitored during session, Repositioned, Ice applied     Extremity/Trunk Assessment Upper Extremity Assessment Upper Extremity Assessment: Overall WFL for  tasks assessed (BUE AROM and strength WFL)   Lower Extremity Assessment Lower Extremity Assessment: LLE deficits/detail RLE Deficits / Details:  (limited tolerance for formal ROM and strength testing, given proximal, radiating pain) LLE Deficits / Details: AROM and strength WFL       Communication  Communication Communication: No apparent difficulties   Cognition Arousal: Alert Behavior During Therapy: WFL for tasks assessed/performed Cognition: No apparent impairments             OT - Cognition Comments: Oriented x4                 Following commands: Intact       Cueing  General Comments   Cueing Techniques: Verbal cues              Home Living Family/patient expects to be discharged to:: Private residence Living Arrangements: Spouse/significant other   Type of Home: House Home Access: Stairs to enter Secretary/administrator of Steps: 3   Home Layout: Two level;Bed/bath upstairs;1/2 bath on main level               Home Equipment: None   Additional Comments: pt reports she can stay with her sister at discharge if needed; sister lives in an apartment with elevator access and no steps into building      Prior Functioning/Environment Prior Level of Function : Driving;Working/employed;Independent/Modified Independent             Mobility Comments: Independent with ambulation. ADLs Comments: Independent with ADLs, working as a LAWYER, counselling psychologist.    OT Problem List: Decreased activity tolerance;Impaired balance (sitting and/or standing);Decreased knowledge of use of DME or AE;Pain   OT Treatment/Interventions: Self-care/ADL training;Therapeutic exercise;Energy conservation;DME and/or AE instruction;Therapeutic activities;Balance training;Patient/family education      OT Goals(Current goals can be found in the care plan section)   Acute Rehab OT Goals Patient Stated Goal: decreased pain and to return to her prior level of functioning OT Goal Formulation: With patient Time For Goal Achievement: 01/06/25 Potential to Achieve Goals: Good ADL Goals Pt Will Perform Lower Body Dressing: with supervision;with set-up;with adaptive equipment;sitting/lateral leans;sit to/from stand Pt Will Transfer to Toilet: with supervision;ambulating;grab  bars Pt Will Perform Toileting - Clothing Manipulation and hygiene: with supervision;with set-up;sit to/from stand Additional ADL Goal #1: The pt will perform bed mobility with supervision, in prep for progressive ADL participation.   OT Frequency:  Min 2X/week       AM-PAC OT 6 Clicks Daily Activity     Outcome Measure Help from another person eating meals?: None Help from another person taking care of personal grooming?: A Little Help from another person toileting, which includes using toliet, bedpan, or urinal?: A Lot Help from another person bathing (including washing, rinsing, drying)?: A Lot Help from another person to put on and taking off regular upper body clothing?: A Little Help from another person to put on and taking off regular lower body clothing?: A Lot 6 Click Score: 16   End of Session Equipment Utilized During Treatment: Gait belt;Rolling walker (2 wheels) Nurse Communication: Mobility status  Activity Tolerance: Patient limited by pain Patient left: in bed;with call bell/phone within reach;with family/visitor present  OT Visit Diagnosis: Unsteadiness on feet (R26.81);Other abnormalities of gait and mobility (R26.89);Pain Pain - Right/Left: Right Pain - part of body: Hip                Time: 1340-1420 OT Time Calculation (min): 40 min Charges:  OT General Charges $OT  Visit: 1 Visit OT Evaluation $OT Eval Moderate Complexity: 1 Mod OT Treatments $Self Care/Home Management : 8-22 mins $Therapeutic Activity: 8-22 mins    Delanna JINNY Lesches, OTR/L 12/23/2024, 3:24 PM

## 2024-12-23 NOTE — TOC Progression Note (Signed)
 Transition of Care [facility]) - Progression Note    Patient Details  Name: Shelley Morrison MRN: [id number] Date of Birth: 1966/03/11  [facility]) CM/SW Contact  Jon ONEIDA Anon, RN Phone Number: 12/23/2024, 3:42 PM  Clinical Narrative:    RNCM received secure chat from OT stating pt is declining PT recommendation for SNF placement. Pt states she would prefer to return home and stay with her sister. Stating that her sister is a engineer, civil (consulting) and can assist pt as needed. OT states pt will need a BSC at discharge. Will need DME orders placed. Sent out referrals for [facility] services, awaitng any acceptances. ICM will continue to follow for DC planning needs.       Expected Discharge Plan: Home w Home Health Services Barriers to Discharge: Continued Medical Work up               Expected Discharge Plan and Services In-house Referral: NA Discharge Planning Services: CM Consult Post Acute Care Choice: Durable Medical Equipment, Home Health Living arrangements for the past 2 months: Single Family Home                                       Social Drivers of Health (SDOH) Interventions SDOH Screenings   Food Insecurity: No Food Insecurity (12/22/2024)  Housing: Low Risk (12/22/2024)  Transportation Needs: No Transportation Needs (12/22/2024)  Utilities: Not At Risk (12/22/2024)  Depression (PHQ2-9): Low Risk (01/23/2023)  Tobacco Use: Medium Risk (12/20/2024)    Readmission Risk Interventions    12/23/2024    3:39 PM  Readmission Risk Prevention Plan  Transportation Screening Complete  PCP or Specialist Appt within 5-7 Days Complete  Home Care Screening Complete  Medication Review (RN CM) Complete

## 2024-12-23 NOTE — Procedures (Signed)
 Interventional Radiology Procedure Note  Procedure: Fluoro guided  right hip aspiration  Complications: None  Estimated Blood Loss: < 10 mL  Findings: Fluoro guided right hip aspiration.  Aspirate samples sent to pathology for further processing.  Shelley DELENA Banner, MD

## 2024-12-23 NOTE — Progress Notes (Signed)
 MEDICATION-RELATED CONSULT NOTE   IR Procedure Consult - Anticoagulant/Antiplatelet PTA/Inpatient Med List Review by Pharmacist    Procedure: fluoro guided aspiration of right hip    Completed: 12/26 ~1000  Post-Procedural bleeding risk per IR MD assessment:  low  Antithrombotic medications on inpatient or PTA profile prior to procedure:    SQ heparin  5000 units q8  Recommended restart time per IR Post-Procedure Guidelines:  Day 0 at least 4 hours or at next standard dose interval   Other considerations:      Plan:    Restart SQ heparin  5000 units q8 this evening at 2200   Eva CHRISTELLA Allis, PharmD, BCPS Secure Chat if ?s 12/23/2024 10:44 AM

## 2024-12-23 NOTE — Progress Notes (Signed)
 Patient has been afebrile since admission.  Virgen cell count is stable at 7.1.  Still awaiting aspiration from interventional radiology.  Will return with further recommendations once this is done.

## 2024-12-24 DIAGNOSIS — I10 Essential (primary) hypertension: Secondary | ICD-10-CM | POA: Diagnosis not present

## 2024-12-24 DIAGNOSIS — F5101 Primary insomnia: Secondary | ICD-10-CM | POA: Diagnosis not present

## 2024-12-24 DIAGNOSIS — M329 Systemic lupus erythematosus, unspecified: Secondary | ICD-10-CM | POA: Diagnosis not present

## 2024-12-24 DIAGNOSIS — R52 Pain, unspecified: Secondary | ICD-10-CM | POA: Diagnosis not present

## 2024-12-24 LAB — COMPREHENSIVE METABOLIC PANEL WITH GFR
ALT: 25 U/L (ref 0–44)
AST: 26 U/L (ref 15–41)
Albumin: 3.6 g/dL (ref 3.5–5.0)
Alkaline Phosphatase: 66 U/L (ref 38–126)
Anion gap: 10 (ref 5–15)
BUN: 11 mg/dL (ref 6–20)
CO2: 27 mmol/L (ref 22–32)
Calcium: 9.9 mg/dL (ref 8.9–10.3)
Chloride: 102 mmol/L (ref 98–111)
Creatinine, Ser: 0.9 mg/dL (ref 0.44–1.00)
GFR, Estimated: >60 mL/min
Glucose, Bld: 92 mg/dL (ref 70–99)
Potassium: 4.1 mmol/L (ref 3.5–5.1)
Sodium: 138 mmol/L (ref 135–145)
Total Bilirubin: 0.4 mg/dL (ref 0.0–1.2)
Total Protein: 7.8 g/dL (ref 6.5–8.1)

## 2024-12-24 LAB — GLUCOSE, CAPILLARY
Glucose-Capillary: 91 mg/dL (ref 70–99)
Glucose-Capillary: 93 mg/dL (ref 70–99)
Glucose-Capillary: 100 mg/dL — ABNORMAL HIGH (ref 70–99)
Glucose-Capillary: 147 mg/dL — ABNORMAL HIGH (ref 70–99)

## 2024-12-24 LAB — CBC WITH DIFFERENTIAL/PLATELET
Abs Immature Granulocytes: 0.02 K/uL (ref 0.00–0.07)
Basophils Absolute: 0 K/uL (ref 0.0–0.1)
Basophils Relative: 1 %
Eosinophils Absolute: 0.1 K/uL (ref 0.0–0.5)
Eosinophils Relative: 1 %
HCT: 36.2 % (ref 36.0–46.0)
Hemoglobin: 11.5 g/dL — ABNORMAL LOW (ref 12.0–15.0)
Immature Granulocytes: 0 %
Lymphocytes Relative: 33 %
Lymphs Abs: 2.2 K/uL (ref 0.7–4.0)
MCH: 29 pg (ref 26.0–34.0)
MCHC: 31.8 g/dL (ref 30.0–36.0)
MCV: 91.4 fL (ref 80.0–100.0)
Monocytes Absolute: 0.8 K/uL (ref 0.1–1.0)
Monocytes Relative: 13 %
Neutro Abs: 3.4 K/uL (ref 1.7–7.7)
Neutrophils Relative %: 52 %
Platelets: 234 K/uL (ref 150–400)
RBC: 3.96 MIL/uL (ref 3.87–5.11)
RDW: 13.1 % (ref 11.5–15.5)
WBC: 6.5 K/uL (ref 4.0–10.5)
nRBC: 0 % (ref 0.0–0.2)

## 2024-12-24 LAB — MAGNESIUM: Magnesium: 2.3 mg/dL (ref 1.7–2.4)

## 2024-12-24 LAB — PHOSPHORUS: Phosphorus: 3.2 mg/dL (ref 2.5–4.6)

## 2024-12-24 MED ORDER — OXYCODONE HCL 5 MG PO TABS
5.0000 mg | ORAL_TABLET | ORAL | Status: DC | PRN
  Administered 2024-12-24 – 2024-12-26 (×4): 10 mg via ORAL
  Filled 2024-12-24 (×4): qty 2

## 2024-12-24 NOTE — Progress Notes (Signed)
 Physical Therapy Treatment Patient Details Name: Shelley Morrison MRN: [id number] DOB: 1966/07/03 Today's Date: 12/24/2024   History of Present Illness 58 yo female presents to therapy following hospital admission on 12/21/24 due to right hip pain.CT pelvis showed no acute fractures but shows degenerative changes in hip L>R and small effusion in R hip. CT lumbar showed multi-level degenerative disc disease.   PMH includes but is not limited to: DM II, elevated LFTs, HTN, lupus, RA,  cholecystectomy and fatty liver.    PT Comments  Pt agreeable to therapy session. Pt sitting up in recliner. She was able to stand from recliner and ambulate ~25 feet with a RW. Pain rated 7/10 with activity. She was able to perform active assist R knee LAQs while seated in recliner with use of gait belt for assistance. Also discussed supine HSs with use of gait belt (instructed to perform ROM exercises as tolerated;to cease exercising if it is too painful). Pt reports she has been ambulating to/from bathroom with assistance. Per chart review, pt has declined placement. Will recommend HHPT f/u.     If plan is discharge home, recommend the following: A little help with walking and/or transfers;A little help with bathing/dressing/bathroom;Assistance with cooking/housework;Assist for transportation;Help with stairs or ramp for entrance   Can travel by private Heritage Manager;Wheelchair Rolling walker (2 wheels)    Recommendations for Other Services       Precautions / Restrictions Precautions Precautions: Fall Restrictions Weight Bearing Restrictions Per Provider Order: No RLE Weight Bearing Per Provider Order: Weight bearing as tolerated Other Position/Activity Restrictions: no formal weight bearing restrictions, pt has poor tolerance to weightbearing on R leg     Mobility  Bed Mobility               General bed mobility comments: oob in recliner     Transfers Overall transfer level: Needs assistance Equipment used: Rolling walker (2 wheels) Transfers: Sit to/from Stand Sit to Stand: Contact guard assist           General transfer comment: Increased time and effort but pt able to stand unassisted with heavy reliance on bil UEs and armrests of recliner.    Ambulation/Gait Ambulation/Gait assistance: Contact guard assist Gait Distance (Feet): 25 Feet Assistive device: Rolling walker (2 wheels) Gait Pattern/deviations: Step-to pattern, Antalgic, Decreased stance time - right, Decreased weight shift to right, Decreased dorsiflexion - right       General Gait Details: Cues for pt to try to activate muscles to advance  R LE using step to pattern instead of using swing thru compensatory technqiue. Pt alternating between NWB and TDWB throughout distance. 2 brief standing rest breaks taken as needed due to fatigue,pain.   Stairs             Wheelchair Mobility     Tilt Bed    Modified Rankin (Stroke Patients Only)       Balance Overall balance assessment: Needs assistance   Sitting balance-Leahy Scale: Fair Sitting balance - Comments: unable to sit comfortably due to pain, left lateral lean, decreased weight through R hip/pelvis   Standing balance support: Bilateral upper extremity supported, During functional activity, Reliant on assistive device for balance Standing balance-Leahy Scale: Fair                              Musician Communication: No apparent difficulties  Cognition  Arousal: Alert Behavior During Therapy: WFL for tasks assessed/performed   PT - Cognitive impairments: No apparent impairments                         Following commands: Intact      Cueing Cueing Techniques: Verbal cues  Exercises General Exercises - Lower Extremity Long Arc Quad: AAROM, Right, 5 reps, Seated (with use of gait belt)    General Comments        Pertinent  Vitals/Pain Pain Assessment Pain Assessment: 0-10 Pain Score: 7  Pain Location: R hip/groin/thigh Pain Descriptors / Indicators: Aching, Dull Pain Intervention(s): Limited activity within patient's tolerance, Monitored during session, Repositioned    Home Living                          Prior Function            PT Goals (current goals can now be found in the care plan section) Progress towards PT goals: Progressing toward goals    Frequency    Min 3X/week      PT Plan      Co-evaluation              AM-PAC PT 6 Clicks Mobility   Outcome Measure  Help needed turning from your back to your side while in a flat bed without using bedrails?: A Little Help needed moving from lying on your back to sitting on the side of a flat bed without using bedrails?: A Little Help needed moving to and from a bed to a chair (including a wheelchair)?: A Little Help needed standing up from a chair using your arms (e.g., wheelchair or bedside chair)?: A Little Help needed to walk in hospital room?: A Little Help needed climbing 3-5 steps with a railing? : Total 6 Click Score: 16    End of Session   Activity Tolerance: Patient limited by fatigue;Patient limited by pain Patient left: in chair;with call bell/phone within reach   PT Visit Diagnosis: Muscle weakness (generalized) (M62.81);Pain;Difficulty in walking, not elsewhere classified (R26.2) Pain - Right/Left: Right Pain - part of body: Hip (thigh)     Time: 8579-8562 PT Time Calculation (min) (ACUTE ONLY): 17 min  Charges:    $Gait Training: 8-22 mins PT General Charges $$ ACUTE PT VISIT: 1 Visit                       Dannial SQUIBB, PT Acute Rehabilitation  Office: [phone]

## 2024-12-24 NOTE — Progress Notes (Signed)
 " PROGRESS NOTE    [name]  FMW:[id number] DOB: 1966/08/15 DOA: 12/21/2024 PCP: Dyane Anthony RAMAN, FNP   Brief Narrative:  Shelley Morrison is a 58 y.o. year old female with medical history of hypertension, rheumatoid arthritis, insomnia presenting to the ED with right hip pain.  Pt states around 11 am yesterday she was walking and developed right hip pain. This continued to get worse. She describes it as sharp pain and states it is localized to the hip. It is severe up to 10/10. States she has not had any fevers or chills but was told she had a fever when she came here. Denies other symptoms on ROS. Denies any URI or UTI symptoms. She states she takes her medications regularly.   On arrival to the ED patient was noted to be HDS stable.  Lab work and imaging obtained.  CBC without leukocytosis and mild anemia near baseline.  ESR is 47, CRP 6.2.  CT pelvis shows no acute fractures but shows degenerative changes in the hip left greater than right with small effusion in the hip right greater than left.  CT lumbar spine showed multilevel degenerative disc disease and moderate to severe category.  Given this, [facility] contacted for admission.  Ortho consulted and recommending holding Abx until Aspiration. IR aspirated and pending results but showing NG currently. Had a slight Temp the night before last but will continue to hold on placing Abx.   Assessment and Plan:   Right Hip Pain: Patient patient with right hip severe pain. Pt admitted for pain control. Ddx include OA vs RA flare vs septic joint. Imaging shows arthritis in both hips. Effusions can be seen in OA and RA. Will consult orthopedic surgery and hold off Abx until aspiration. CRP was 6.2 and ESR was 47. Will start multimodal pain control and  she was Started on oxycodone  5 mg q6hrs prn but will increase to 5-10. IV Dilaudid  1 mg q2hrs for breakthrough pain.  Normal renal function and no hx of PUD noted. Started robaxin  500 mg q4hrs. PT and OT  consulted. IR aspirated Hip and showed:  Gram Stain ABUNDANT WBC PRESENT, PREDOMINANTLY PMN NO ORGANISMS SEEN  Culture NO GROWTH < 24 HOURS Performed at [facility], [address] [place], [place] [address]  -Per Orthopedic Surgery no overnight events and they agree make further recommendations once able to follow-up on results   Febrile Episode: Patient with fever at 101.1 on admission and had another febrile episode of 100.4 but fevers have improved.Blood cultures ordered and showing NGTD @ 3 days. UA Negaive. Respiratory panel ordered.  Exactly unsure of the etiology but septic joint is lower on my differential currently but if continues to have fever will be higher on the differential. Holding APAP to monitor fever trend. If other workup is negative.  Orthopedic Surgery consulted and recommending aspiration and holding on antibiotics until this can be obtained; Aspiration done and pending results. Continue to Hold off Abx for now    Essential Hypertension: On losartan  50 mg and resumed. CTM BP per Protocol. Will use IV labetalol prn. Last BP reading was 143/87   Type 2 diabetes: Not on any pharmacotherapy.  Will start SSI. CBG Trend:  Recent Labs  Lab 12/23/24 0742 12/23/24 1131 12/23/24 1611 12/23/24 2107 12/24/24 0749 12/24/24 1141 12/24/24 1753  GLUCAP 101* 99 127* 152* 91 93 100*  -HbA1c is now 6.2. CTM CBG's per Protocol   Rheumatoid Arthritis/History of SLE: Sees Rheumatology outpatient.  Outpatient records states patient should be on Sulfasalazine  500 mg twice daily and now resumed   Insomnia: No longer taking Trazodone  100 mg as needed at bedtime.   Palpitations: No longer on Atenolol as needed for Palpitations  Normocytic Anemia: Hgb/Hct Trend:  Recent Labs  Lab 12/21/24 1209 12/22/24 0541 12/23/24 0505 12/24/24 0548  HGB 11.7* 11.8* 11.8* 11.5*  HCT 36.7 37.1 37.1 36.2  MCV 93.1 92.5 92.3 91.4  -Checked Anemia Panel and showed an iron level of 26, UIBC  of 230, TIBC of 256, ferritin level of 438, folate level 7.5 and vitamin B12 462. CTM for S/Sx of Bleeding; No overt bleeding noted -Repeat CBC in the AM   Overweight: Complicates overall prognosis and care. Estimated body mass index is 26.86 kg/m as calculated from the following:   Height as of 12/20/24: 5' 10 (1.778 m).   Weight as of this encounter: 84.9 kg. Weight Loss and Dietary Counseling given   DVT prophylaxis: heparin  injection 5,000 Units Start: 12/23/24 2200    Code Status: Full Code Family Communication: No family present @ bedside  Disposition Plan:  Level of care: Med-Surg Status is: Inpatient Remains inpatient appropriate because: Needs further clinical improvement and clearance by Orthopedic Surgery   Consultants:  IR Orthopedic Surgery  Procedures:  IR Hip Aspiration  Antimicrobials:  Anti-infectives (From admission, onward)    None       Subjective: Seen and examined at bedside and still having some hip pain but thinks he is doing a bit better.  Had no temperatures overnight.  Note lightheadedness or dizziness.  Patient still has a little bit of pain but thinks it is getting better.  No other concerns or complaints at this time.  Objective: Vitals:   12/23/24 1134 12/23/24 1958 12/24/24 0512 12/24/24 0726  BP: (!) 145/82 (!) 142/87 (!) 143/87   Pulse: 95 (!) 104 95   Resp:  18 18   Temp: 98.3 F (36.8 C) 100 F (37.8 C) 99.5 F (37.5 C)   TempSrc:  Oral Oral   SpO2: 94% 91% 95%   Weight:    84.9 kg   No intake or output data in the 24 hours ending 12/24/24 1834 Filed Weights   12/22/24 0500 12/23/24 0500 12/24/24 0726  Weight: 86.6 kg 86.6 kg 84.9 kg   Examination: Physical Exam:  Constitutional: WN/WD overweight African-American female in no acute distress appears a little uncomfortable Respiratory: Diminished to auscultation bilaterally, no wheezing, rales, rhonchi or crackles. Normal respiratory effort and patient is not tachypenic. No  accessory muscle use.  Unlabored breathing Cardiovascular: RRR, no murmurs / rubs / gallops. S1 and S2 auscultated. No extremity edema.  Abdomen: Soft, non-tender, slightly distended secondary to body habitus. Bowel sounds positive.  GU: Deferred. Musculoskeletal: No clubbing / cyanosis of digits/nails. No joint deformity upper and lower extremities.  Skin: No rashes, lesions, ulcers on limited skin evaluation. No induration; Warm and dry.  Neurologic: CN 2-12 grossly intact with no focal deficits. Romberg sign and cerebellar reflexes not assessed.  Psychiatric: Normal judgment and insight. Alert and oriented x 3. Normal mood and appropriate affect.   Data Reviewed: I have personally reviewed following labs and imaging studies  CBC: Recent Labs  Lab 12/21/24 1209 12/22/24 0541 12/23/24 0505 12/24/24 0548  WBC 10.3 7.1 7.1 6.5  NEUTROABS  --   --  3.9 3.4  HGB 11.7* 11.8* 11.8* 11.5*  HCT 36.7 37.1 37.1 36.2  MCV 93.1 92.5 92.3 91.4  PLT 217  210 216 234   Basic Metabolic Panel: Recent Labs  Lab 12/21/24 1209 12/21/24 1922 12/22/24 0541 12/23/24 0505 12/24/24 0548  NA 136  --  138 138 138  K 3.8  --  3.9 3.8 4.1  CL 106  --  105 101 102  CO2 22  --  23 27 27   GLUCOSE 123*  --  116* 84 92  BUN 14  --  10 9 11   CREATININE 0.77  --  0.78 0.74 0.90  CALCIUM 9.4  --  9.4 9.9 9.9  MG  --  2.0  --  2.5* 2.3  PHOS  --   --   --  2.8 3.2   GFR: Estimated Creatinine Clearance: 80.8 mL/min (by C-G formula based on SCr of 0.9 mg/dL). Liver Function Tests: Recent Labs  Lab 12/23/24 0505 12/24/24 0548  AST 26 26  ALT 27 25  ALKPHOS 67 66  BILITOT 0.5 0.4  PROT 7.7 7.8  ALBUMIN 3.6 3.6   No results for input(s): LIPASE, AMYLASE in the last 168 hours. No results for input(s): AMMONIA in the last 168 hours. Coagulation Profile: No results for input(s): INR, PROTIME in the last 168 hours. Cardiac Enzymes: No results for input(s): CKTOTAL, CKMB, CKMBINDEX,  TROPONINI in the last 168 hours. BNP (last 3 results) No results for input(s): PROBNP in the last 8760 hours. HbA1C: No results for input(s): HGBA1C in the last 72 hours. CBG: Recent Labs  Lab 12/23/24 1611 12/23/24 2107 12/24/24 0749 12/24/24 1141 12/24/24 1753  GLUCAP 127* 152* 91 93 100*   Lipid Profile: No results for input(s): CHOL, HDL, LDLCALC, TRIG, CHOLHDL, LDLDIRECT in the last 72 hours. Thyroid Function Tests: No results for input(s): TSH, T4TOTAL, FREET4, T3FREE, THYROIDAB in the last 72 hours. Anemia Panel: Recent Labs    12/23/24 0505  VITAMINB12 462  FOLATE 7.5  FERRITIN 438*  TIBC 256  IRON 26*  RETICCTPCT 1.2   Sepsis Labs: No results for input(s): PROCALCITON, LATICACIDVEN in the last 168 hours.  Recent Results (from the past 240 hours)  Resp panel by RT-PCR (RSV, Flu A&B, Covid) Anterior Nasal Swab     Status: None   Collection Time: 12/21/24  6:19 PM   Specimen: Anterior Nasal Swab  Result Value Ref Range Status   SARS Coronavirus 2 by RT PCR NEGATIVE NEGATIVE Final    Comment: (NOTE) SARS-CoV-2 target nucleic acids are NOT DETECTED.  The SARS-CoV-2 RNA is generally detectable in upper respiratory specimens during the acute phase of infection. The lowest concentration of SARS-CoV-2 viral copies this assay can detect is 138 copies/mL. A negative result does not preclude SARS-Cov-2 infection and should not be used as the sole basis for treatment or other patient management decisions. A negative result may occur with  improper specimen collection/handling, submission of specimen other than nasopharyngeal swab, presence of viral mutation(s) within the areas targeted by this assay, and inadequate number of viral copies(<138 copies/mL). A negative result must be combined with clinical observations, patient history, and epidemiological information. The expected result is Negative.  Fact Sheet for Patients:   bloggercourse.com  Fact Sheet for Healthcare Providers:  seriousbroker.it  This test is no t yet approved or cleared by the [place]  FDA and  has been authorized for detection and/or diagnosis of SARS-CoV-2 by FDA under an Emergency Use Authorization (EUA). This EUA will remain  in effect (meaning this test can be used) for the duration of the COVID-19 declaration under Section 564(b)(1) of the  Act, 21 U.S.C.section 360bbb-3(b)(1), unless the authorization is terminated  or revoked sooner.       Influenza A by PCR NEGATIVE NEGATIVE Final   Influenza B by PCR NEGATIVE NEGATIVE Final    Comment: (NOTE) The Xpert Xpress SARS-CoV-2/FLU/RSV plus assay is intended as an aid in the diagnosis of influenza from Nasopharyngeal swab specimens and should not be used as a sole basis for treatment. Nasal washings and aspirates are unacceptable for Xpert Xpress SARS-CoV-2/FLU/RSV testing.  Fact Sheet for Patients: bloggercourse.com  Fact Sheet for Healthcare Providers: seriousbroker.it  This test is not yet approved or cleared by the [place]  FDA and has been authorized for detection and/or diagnosis of SARS-CoV-2 by FDA under an Emergency Use Authorization (EUA). This EUA will remain in effect (meaning this test can be used) for the duration of the COVID-19 declaration under Section 564(b)(1) of the Act, 21 U.S.C. section 360bbb-3(b)(1), unless the authorization is terminated or revoked.     Resp Syncytial Virus by PCR NEGATIVE NEGATIVE Final    Comment: (NOTE) Fact Sheet for Patients: bloggercourse.com  Fact Sheet for Healthcare Providers: seriousbroker.it  This test is not yet approved or cleared by the [place]  FDA and has been authorized for detection and/or diagnosis of SARS-CoV-2 by FDA under an Emergency Use  Authorization (EUA). This EUA will remain in effect (meaning this test can be used) for the duration of the COVID-19 declaration under Section 564(b)(1) of the Act, 21 U.S.C. section 360bbb-3(b)(1), unless the authorization is terminated or revoked.  Performed at [facility], [address] [place], [place] [address]   Culture, blood (Routine X 2) w Reflex to ID Panel     Status: None (Preliminary result)   Collection Time: 12/21/24  7:29 PM   Specimen: BLOOD RIGHT ARM  Result Value Ref Range Status   Specimen Description   Final    BLOOD RIGHT ARM Performed at [facility], [address] [place], [place] [address]    Special Requests   Final    BOTTLES DRAWN AEROBIC AND ANAEROBIC Blood Culture adequate volume Performed at [facility], [address] [place], [place] [address]    Culture   Final    NO GROWTH 3 DAYS Performed at [facility], [address] [place], [place] [address]    Report Status PENDING  Incomplete  Culture, blood (Routine X 2) w Reflex to ID Panel     Status: None (Preliminary result)   Collection Time: 12/21/24  7:36 PM   Specimen: BLOOD RIGHT FOREARM  Result Value Ref Range Status   Specimen Description   Final    BLOOD RIGHT FOREARM Performed at [facility], [address], [place], [place] [address]    Special Requests   Final    BOTTLES DRAWN AEROBIC AND ANAEROBIC Blood Culture adequate volume Performed at [facility], [address], [place], [place] [address]    Culture   Final    NO GROWTH 3 DAYS Performed at [facility], [address] [place], [place] [address]    Report Status PENDING  Incomplete  Aerobic/Anaerobic Culture w Gram Stain (surgical/deep wound)     Status: None (Preliminary result)   Collection Time: 12/23/24 10:15 AM   Specimen: Joint, Right Hip; Tissue  Result Value Ref Range Status   Specimen Description   Final    JOINT  FLUID Performed at [facility], [address]  [place], [place] [address]    Special Requests   Final    RIGHT HIP Performed at [facility], [address] [place], [place] [address]    Gram Stain   Final    ABUNDANT WBC PRESENT, PREDOMINANTLY PMN NO ORGANISMS SEEN    Culture   Final    NO GROWTH < 24 HOURS Performed at [facility], [address], [place], [place] [address]    Report Status PENDING  Incomplete    Radiology Studies: IR Fluoro Guide Ndl Plmt / BX Result Date: 12/23/2024 INDICATION: Right hip pain of unknown etiology. CT pelvis 12/21/2024 demonstrates right hip joint effusion. EXAM: Fluoroscopy guided hip aspiration MEDICATIONS: None. COMPLICATIONS: None immediate. PROCEDURE: Informed written consent was obtained from the patient after a thorough discussion of the procedural risks, benefits and alternatives. All questions were addressed. Maximal Sterile Barrier Technique was utilized including caps, mask, sterile gowns, sterile gloves, sterile drape, hand hygiene and skin antiseptic. A timeout was performed prior to the initiation of the procedure. Fluoroscopy was used to visualize the right hip joint. Local anesthesia was achieved with 1% lidocaine . Lidocaine  was infiltrated from the skin down to the neck of the right femur. A spinal needle was then advanced under fluoroscopic guidance to the femoral head/neck junction. Approximately 7 mL of cloudy yellow fluid was aspirated. Aspirate sent to pathology for culture and sensitivity. Needle was withdrawn. IMPRESSION: Satisfactory aspiration of 7 mL from the right hip joint under fluoroscopic guidance. Electronically Signed   By: Cordella Banner   On: 12/23/2024 10:31   Scheduled Meds:  heparin   5,000 Units Subcutaneous Q8H   insulin  aspart  0-5 Units Subcutaneous QHS   insulin  aspart  0-9 Units Subcutaneous TID WC   losartan   50 mg Oral Daily   sulfaSALAzine   500 mg Oral BID    Continuous Infusions:   LOS: 2 days   Alejandro Marker, DO [facility] Available via Epic secure chat 7am-7pm After these hours, please refer to coverage provider listed on [email or web address] 12/24/2024, 6:34 PM  "

## 2024-12-24 NOTE — Plan of Care (Signed)

## 2024-12-24 NOTE — Progress Notes (Signed)
 Aspiration performed yesterday from IR -- see separate note.  Cultures pending.  Patient still hemodynamically stable.  No overnight events.  Further recommendations once able to follow up on results.   Bernarda Mclean PA-C [facility] [place]

## 2024-12-25 DIAGNOSIS — I10 Essential (primary) hypertension: Secondary | ICD-10-CM | POA: Diagnosis not present

## 2024-12-25 DIAGNOSIS — F5101 Primary insomnia: Secondary | ICD-10-CM | POA: Diagnosis not present

## 2024-12-25 DIAGNOSIS — R52 Pain, unspecified: Secondary | ICD-10-CM | POA: Diagnosis not present

## 2024-12-25 DIAGNOSIS — M329 Systemic lupus erythematosus, unspecified: Secondary | ICD-10-CM | POA: Diagnosis not present

## 2024-12-25 LAB — GLUCOSE, CAPILLARY
Glucose-Capillary: 92 mg/dL (ref 70–99)
Glucose-Capillary: 99 mg/dL (ref 70–99)
Glucose-Capillary: 87 mg/dL (ref 70–99)
Glucose-Capillary: 97 mg/dL (ref 70–99)

## 2024-12-25 MED ORDER — BISACODYL 10 MG RE SUPP: 10.0000 mg | Freq: Every day | RECTAL | Status: DC | PRN

## 2024-12-25 MED ORDER — KETOROLAC TROMETHAMINE 30 MG/ML IJ SOLN
30.0000 mg | Freq: Three times a day (TID) | INTRAMUSCULAR | Status: DC | PRN
  Administered 2024-12-25: 30 mg via INTRAVENOUS
  Filled 2024-12-25: qty 1

## 2024-12-25 MED ORDER — POLYETHYLENE GLYCOL 3350 17 G PO PACK
17.0000 g | PACK | Freq: Two times a day (BID) | ORAL | Status: DC
  Administered 2024-12-25 – 2024-12-26 (×3): 17 g via ORAL
  Filled 2024-12-25 (×3): qty 1

## 2024-12-25 MED ORDER — HYDROMORPHONE HCL 1 MG/ML IJ SOLN
0.5000 mg | INTRAMUSCULAR | Status: DC | PRN
  Administered 2024-12-25 – 2024-12-26 (×2): 1 mg via INTRAVENOUS
  Filled 2024-12-25: qty 1

## 2024-12-25 MED ORDER — SENNOSIDES-DOCUSATE SODIUM 8.6-50 MG PO TABS
1.0000 | ORAL_TABLET | Freq: Two times a day (BID) | ORAL | Status: DC
  Administered 2024-12-25 – 2024-12-26 (×3): 1 via ORAL
  Filled 2024-12-25 (×2): qty 1

## 2024-12-25 NOTE — Plan of Care (Signed)
" °  Problem: Education: Goal: Knowledge of General Education information will improve Description: Including pain rating scale, medication(s)/side effects and non-pharmacologic comfort measures Outcome: Progressing   Problem: Coping: Goal: Level of anxiety will decrease Outcome: Progressing   Problem: Elimination: Goal: Will not experience complications related to bowel motility Outcome: Progressing   Problem: Safety: Goal: Ability to remain free from injury will improve Outcome: Progressing   Problem: Skin Integrity: Goal: Risk for impaired skin integrity will decrease Outcome: Progressing   Problem: Coping: Goal: Ability to adjust to condition or change in health will improve Outcome: Progressing   Problem: Fluid Volume: Goal: Ability to maintain a balanced intake and output will improve Outcome: Progressing   Problem: Health Behavior/Discharge Planning: Goal: Ability to manage health-related needs will improve Outcome: Progressing   Problem: Nutritional: Goal: Maintenance of adequate nutrition will improve Outcome: Progressing   Problem: Skin Integrity: Goal: Risk for impaired skin integrity will decrease Outcome: Progressing   "

## 2024-12-25 NOTE — Progress Notes (Signed)
 " PROGRESS NOTE    Shelley Morrison  FMW:[id number] DOB: September 21, 1966 DOA: 12/21/2024 PCP: Dyane Anthony RAMAN, FNP   Brief Narrative:  Shelley Morrison is a 58 y.o. year old female with medical history of hypertension, rheumatoid arthritis, insomnia presenting to the ED with right hip pain.  Pt states around 11 am yesterday she was walking and developed right hip pain. This continued to get worse. She describes it as sharp pain and states it is localized to the hip. It is severe up to 10/10. States she has not had any fevers or chills but was told she had a fever when she came here. Denies other symptoms on ROS. Denies any URI or UTI symptoms. She states she takes her medications regularly.   On arrival to the ED patient was noted to be HDS stable.  Lab work and imaging obtained.  CBC without leukocytosis and mild anemia near baseline.  ESR is 47, CRP 6.2.  CT pelvis shows no acute fractures but shows degenerative changes in the hip left greater than right with small effusion in the hip right greater than left.  CT lumbar spine showed multilevel degenerative disc disease and moderate to severe category.  Given this, TR[facility] contacted for admission.  Ortho consulted and recommending holding Abx until Aspiration. IR aspirated and pending results but showing NG currently x 2. Had a slight Temp the night before last but will continue to hold on placing Abx.  Will continue to avoid placing on antibiotics and if we can get her pain better controlled on oral medications we will discharge her in the next 24 hours  Assessment and Plan:   Right Hip Pain: Patient patient with right hip severe pain. Pt admitted for pain control. Ddx include OA vs RA flare vs septic joint. Imaging shows arthritis in both hips. Effusions can be seen in OA and RA. Will consult orthopedic surgery and hold off Abx until aspiration. CRP was 6.2 and ESR was 47. Will start multimodal pain control and  she was Started on oxycodone  5 mg q6hrs prn but will  increase to 5-10. IV Dilaudid  1 mg q2hrs as needed will go to every 4 as needed for breakthrough pain.  Normal renal function and no hx of PUD noted. Started robaxin  500 mg q4hrs. PT and OT consulted and they are recommending home health follow-up. IR aspirated Hip and showed:  Gram Stain ABUNDANT WBC PRESENT, PREDOMINANTLY PMN NO ORGANISMS SEEN  Culture NO GROWTH 2 DAYS NO ANAEROBES ISOLATED; CULTURE IN PROGRESS FOR 5 DAYS Performed at [facility], [address] [place], [place] [address]  -Per Orthopedic Surgery no overnight events and they agree make further recommendations once able to follow-up on results however if they continue to show no growth today.  The orthopedic team feels that this pain is suspected to a rheumatoid arthritis flareup or regular osteoarthritis flareup and recommending that the patient can be discharged once her pain is fairly well-controlled on orals. -We will transition her off of the IV Dilaudid  and add IV ketorolac .  If her pain can be fairly well-controlled with oral medications and she has been weaned off of the IVs we can discharge her home the next 24 hours   Febrile Episode: Patient with fever at 101.1 on admission and had another febrile episode of 100.4 but fevers have improved and resolved. Blood cultures ordered and showing NGTD @ 3 days. UA Negaive. Respiratory panel ordered.  Exactly unsure of the etiology but septic joint  is lower on my differential currently but if continues to have fever will be higher on the differential. Holding APAP to monitor fever trend. If other workup is negative.  Orthopedic Surgery consulted and recommending aspiration and holding on antibiotics until this can be obtained; Aspiration done and pending results. Continue to Hold off Abx for now    Essential Hypertension: On losartan  50 mg and resumed. CTM BP per Protocol. Will use IV labetalol prn. Last BP reading was 125/80  Constipation: Start Bowel Regimen with  Senna-Docusate 1 tab po BID, Miralax  17 mg po BID, and Bisacodyl  10 mg RC daily    Type 2 Diabetes Mellitus: Not on any pharmacotherapy.  Will start SSI. CBG Trend:  Recent Labs  Lab 12/24/24 0749 12/24/24 1141 12/24/24 1753 12/24/24 2041 12/25/24 0811 12/25/24 1133 12/25/24 1602  GLUCAP 91 93 100* 147* 92 99 87  -HbA1c is now 6.2. CTM CBG's per Protocol   Rheumatoid Arthritis/History of SLE: Sees Rheumatology outpatient.  Outpatient records states patient should be on Sulfasalazine  500 mg twice daily and now resumed   Insomnia: No longer taking Trazodone  100 mg as needed at bedtime.   Palpitations: No longer on Atenolol as needed for Palpitations  Normocytic Anemia: Hgb/Hct Trend stable:  Recent Labs  Lab 12/21/24 1209 12/22/24 0541 12/23/24 0505 12/24/24 0548  HGB 11.7* 11.8* 11.8* 11.5*  HCT 36.7 37.1 37.1 36.2  MCV 93.1 92.5 92.3 91.4  -Checked Anemia Panel and showed an iron level of 26, UIBC of 230, TIBC of 256, ferritin level of 438, folate level 7.5 and vitamin B12 462. CTM for S/Sx of Bleeding; No overt bleeding noted -Repeat CBC intermittently   Overweight: Complicates overall prognosis and care. Estimated body mass index is 26.86 kg/m as calculated from the following:   Height as of 12/20/24: 5' 10 (1.778 m).   Weight as of this encounter: 84.9 kg. Weight Loss and Dietary Counseling given   DVT prophylaxis: heparin  injection 5,000 Units Start: 12/23/24 2200    Code Status: Full Code Family Communication: No family present @ bedside   Disposition Plan:  Level of care: Med-Surg Status is: Inpatient Remains inpatient appropriate because: Will need to wean her off of the IV Dilaudid  as she has been taking this every 2 hours and see if we can control her pain with oral medications and anticipate discharge in the next 24 hours   Consultants:  Orthopedic Surgery IR  Procedures:  As delineated as above   Antimicrobials:  Anti-infectives (From admission,  onward)    None       Subjective: Seen and examined at bedside thinks her pain is getting a little bit better but continues to be significant amount of IV pain meds.  Will need to wean her off to try and get her home.  Orthopedic surgery has cleared her from their perspective and the current culture showing no growth and the orthopedic team feels that it may have been a rheumatoid or osteoarthritic flare.  If pain is well-controlled by the a.m. on orals we can discharge her home as she has been ambulating with her walker.  Objective: Vitals:   12/24/24 1946 12/25/24 0543 12/25/24 0649 12/25/24 1224  BP: 138/84 (!) 146/86  125/80  Pulse: 95 90  (!) 105  Resp: 18 18  18   Temp: 99.1 F (37.3 C) 98.7 F (37.1 C)  98.9 F (37.2 C)  TempSrc: Oral Oral  Oral  SpO2: 92% 93%  100%  Weight:   84.9 kg  No intake or output data in the 24 hours ending 12/25/24 1949 Filed Weights   12/23/24 0500 12/24/24 0726 12/25/24 0649  Weight: 86.6 kg 84.9 kg 84.9 kg   Examination: Physical Exam:  Constitutional: WN/WD obese African-American female in no acute distress appears calm Respiratory: Diminished to auscultation bilaterally, no wheezing, rales, rhonchi or crackles. Normal respiratory effort and patient is not tachypenic. No accessory muscle use.  Unlabored breathing Cardiovascular: RRR, no murmurs / rubs / gallops. S1 and S2 auscultated. No extremity edema. Abdomen: Soft, non-tender, slightly distended secondary body habitus. Bowel sounds positive.  GU: Deferred. Musculoskeletal: No clubbing / cyanosis of digits/nails. No joint deformity upper and lower extremities.  Skin: No rashes, lesions, ulcers on limited skin evaluation. No induration; Warm and dry.  Neurologic: CN 2-12 grossly intact with no focal deficits. Romberg sign and cerebellar reflexes not assessed.  Psychiatric: Normal judgment and insight. Alert and oriented x 3. Normal mood and appropriate affect.   Data Reviewed: I have  personally reviewed following labs and imaging studies  CBC: Recent Labs  Lab 12/21/24 1209 12/22/24 0541 12/23/24 0505 12/24/24 0548  WBC 10.3 7.1 7.1 6.5  NEUTROABS  --   --  3.9 3.4  HGB 11.7* 11.8* 11.8* 11.5*  HCT 36.7 37.1 37.1 36.2  MCV 93.1 92.5 92.3 91.4  PLT 217 210 216 234   Basic Metabolic Panel: Recent Labs  Lab 12/21/24 1209 12/21/24 1922 12/22/24 0541 12/23/24 0505 12/24/24 0548  NA 136  --  138 138 138  K 3.8  --  3.9 3.8 4.1  CL 106  --  105 101 102  CO2 22  --  23 27 27   GLUCOSE 123*  --  116* 84 92  BUN 14  --  10 9 11   CREATININE 0.77  --  0.78 0.74 0.90  CALCIUM 9.4  --  9.4 9.9 9.9  MG  --  2.0  --  2.5* 2.3  PHOS  --   --   --  2.8 3.2   GFR: Estimated Creatinine Clearance: 80.8 mL/min (by C-G formula based on SCr of 0.9 mg/dL). Liver Function Tests: Recent Labs  Lab 12/23/24 0505 12/24/24 0548  AST 26 26  ALT 27 25  ALKPHOS 67 66  BILITOT 0.5 0.4  PROT 7.7 7.8  ALBUMIN 3.6 3.6   No results for input(s): LIPASE, AMYLASE in the last 168 hours. No results for input(s): AMMONIA in the last 168 hours. Coagulation Profile: No results for input(s): INR, PROTIME in the last 168 hours. Cardiac Enzymes: No results for input(s): CKTOTAL, CKMB, CKMBINDEX, TROPONINI in the last 168 hours. BNP (last 3 results) No results for input(s): PROBNP in the last 8760 hours. HbA1C: No results for input(s): HGBA1C in the last 72 hours. CBG: Recent Labs  Lab 12/24/24 1753 12/24/24 2041 12/25/24 0811 12/25/24 1133 12/25/24 1602  GLUCAP 100* 147* 92 99 87   Lipid Profile: No results for input(s): CHOL, HDL, LDLCALC, TRIG, CHOLHDL, LDLDIRECT in the last 72 hours. Thyroid Function Tests: No results for input(s): TSH, T4TOTAL, FREET4, T3FREE, THYROIDAB in the last 72 hours. Anemia Panel: Recent Labs    12/23/24 0505  VITAMINB12 462  FOLATE 7.5  FERRITIN 438*  TIBC 256  IRON 26*  RETICCTPCT 1.2    Sepsis Labs: No results for input(s): PROCALCITON, LATICACIDVEN in the last 168 hours.  Recent Results (from the past 240 hours)  Resp panel by RT-PCR (RSV, Flu A&B, Covid) Anterior Nasal Swab  Status: None   Collection Time: 12/21/24  6:19 PM   Specimen: Anterior Nasal Swab  Result Value Ref Range Status   SARS Coronavirus 2 by RT PCR NEGATIVE NEGATIVE Final    Comment: (NOTE) SARS-CoV-2 target nucleic acids are NOT DETECTED.  The SARS-CoV-2 RNA is generally detectable in upper respiratory specimens during the acute phase of infection. The lowest concentration of SARS-CoV-2 viral copies this assay can detect is 138 copies/mL. A negative result does not preclude SARS-Cov-2 infection and should not be used as the sole basis for treatment or other patient management decisions. A negative result may occur with  improper specimen collection/handling, submission of specimen other than nasopharyngeal swab, presence of viral mutation(s) within the areas targeted by this assay, and inadequate number of viral copies(<138 copies/mL). A negative result must be combined with clinical observations, patient history, and epidemiological information. The expected result is Negative.  Fact Sheet for Patients:  bloggercourse.com  Fact Sheet for Healthcare Providers:  seriousbroker.it  This test is no t yet approved or cleared by the [place]  FDA and  has been authorized for detection and/or diagnosis of SARS-CoV-2 by FDA under an Emergency Use Authorization (EUA). This EUA will remain  in effect (meaning this test can be used) for the duration of the COVID-19 declaration under Section 564(b)(1) of the Act, 21 U.S.C.section 360bbb-3(b)(1), unless the authorization is terminated  or revoked sooner.       Influenza A by PCR NEGATIVE NEGATIVE Final   Influenza B by PCR NEGATIVE NEGATIVE Final    Comment: (NOTE) The Xpert Xpress  SARS-CoV-2/FLU/RSV plus assay is intended as an aid in the diagnosis of influenza from Nasopharyngeal swab specimens and should not be used as a sole basis for treatment. Nasal washings and aspirates are unacceptable for Xpert Xpress SARS-CoV-2/FLU/RSV testing.  Fact Sheet for Patients: bloggercourse.com  Fact Sheet for Healthcare Providers: seriousbroker.it  This test is not yet approved or cleared by the [place]  FDA and has been authorized for detection and/or diagnosis of SARS-CoV-2 by FDA under an Emergency Use Authorization (EUA). This EUA will remain in effect (meaning this test can be used) for the duration of the COVID-19 declaration under Section 564(b)(1) of the Act, 21 U.S.C. section 360bbb-3(b)(1), unless the authorization is terminated or revoked.     Resp Syncytial Virus by PCR NEGATIVE NEGATIVE Final    Comment: (NOTE) Fact Sheet for Patients: bloggercourse.com  Fact Sheet for Healthcare Providers: seriousbroker.it  This test is not yet approved or cleared by the [place]  FDA and has been authorized for detection and/or diagnosis of SARS-CoV-2 by FDA under an Emergency Use Authorization (EUA). This EUA will remain in effect (meaning this test can be used) for the duration of the COVID-19 declaration under Section 564(b)(1) of the Act, 21 U.S.C. section 360bbb-3(b)(1), unless the authorization is terminated or revoked.  Performed at [facility], [address], [place], [place] [address]   Culture, blood (Routine X 2) w Reflex to ID Panel     Status: None (Preliminary result)   Collection Time: 12/21/24  7:29 PM   Specimen: BLOOD RIGHT ARM  Result Value Ref Range Status   Specimen Description   Final    BLOOD RIGHT ARM Performed at [facility], [address], [place], [place] [address]    Special Requests   Final     BOTTLES DRAWN AEROBIC AND ANAEROBIC Blood Culture adequate volume Performed at [facility], [address]  [place], [place] [address]    Culture   Final    NO GROWTH 4 DAYS Performed at [facility], [address] [place], [place] [address]    Report Status PENDING  Incomplete  Culture, blood (Routine X 2) w Reflex to ID Panel     Status: None (Preliminary result)   Collection Time: 12/21/24  7:36 PM   Specimen: BLOOD RIGHT FOREARM  Result Value Ref Range Status   Specimen Description   Final    BLOOD RIGHT FOREARM Performed at [facility], [address], [place], [place] [address]    Special Requests   Final    BOTTLES DRAWN AEROBIC AND ANAEROBIC Blood Culture adequate volume Performed at [facility], [address], [place], [place] [address]    Culture   Final    NO GROWTH 4 DAYS Performed at [facility], [address] [place], [place] [address]    Report Status PENDING  Incomplete  Aerobic/Anaerobic Culture w Gram Stain (surgical/deep wound)     Status: None (Preliminary result)   Collection Time: 12/23/24 10:15 AM   Specimen: Joint, Right Hip; Tissue  Result Value Ref Range Status   Specimen Description   Final    JOINT FLUID Performed at [facility], [address] [place], [place] [address]    Special Requests   Final    RIGHT HIP Performed at [facility], [address], [place], [place] [address]    Gram Stain   Final    ABUNDANT WBC PRESENT, PREDOMINANTLY PMN NO ORGANISMS SEEN    Culture   Final    NO GROWTH 2 DAYS NO ANAEROBES ISOLATED; CULTURE IN PROGRESS FOR 5 DAYS Performed at [facility], [address], [place], [place] [address]    Report Status PENDING  Incomplete    Radiology Studies: No results found.  Scheduled Meds:  heparin   5,000 Units Subcutaneous Q8H   insulin  aspart  0-5 Units Subcutaneous QHS   insulin  aspart  0-9 Units  Subcutaneous TID WC   losartan   50 mg Oral Daily   polyethylene glycol  17 g Oral BID   senna-docusate  1 tablet Oral BID   sulfaSALAzine   500 mg Oral BID   Continuous Infusions:   LOS: 3 days   Alejandro Marker, DO [facility] Available via Epic secure chat 7am-7pm After these hours, please refer to coverage provider listed on [email or web address] 12/25/2024, 7:49 PM  "

## 2024-12-25 NOTE — Progress Notes (Signed)
 Orthopaedic Progress Note  S: Patient ports her hip pain is improving.  She is able to ambulate with a walker and using her pain medications.  O:  Vitals:   12/25/24 0543 12/25/24 1224  BP: (!) 146/86 125/80  Pulse: 90 (!) 105  Resp: 18 18  Temp: 98.7 F (37.1 C) 98.9 F (37.2 C)  SpO2: 93% 100%    Status moderate pain with range of motion of the right hip  Culture showed no growth to date    Labs:  Results for orders placed or performed during the hospital encounter of 12/21/24 (from the past 24 hours)  Glucose, capillary     Status: Abnormal   Collection Time: 12/24/24  5:53 PM  Result Value Ref Range   Glucose-Capillary 100 (H) 70 - 99 mg/dL  Glucose, capillary     Status: Abnormal   Collection Time: 12/24/24  8:41 PM  Result Value Ref Range   Glucose-Capillary 147 (H) 70 - 99 mg/dL  Glucose, capillary     Status: None   Collection Time: 12/25/24  8:11 AM  Result Value Ref Range   Glucose-Capillary 92 70 - 99 mg/dL  Glucose, capillary     Status: None   Collection Time: 12/25/24 11:33 AM  Result Value Ref Range   Glucose-Capillary 99 70 - 99 mg/dL    Assessment: Right hip pain  The patient did have severe right hip pain stemming from an effusion.  This seems to be improving for her.  The aspiration shows no growth to date.  While she has had some low-grade fevers, these have not been overly high.  She does have slightly elevated inflammatory markers but again not overly concerning.  I suspect her pain is more like related to a rheumatoid flareup or to regular osteoarthritis.  At this point from an orthopedic standpoint, I think she can be discharged to home once stable from medicine.  She should continue the anti-inflammatory medications as well as therapy for her pain.  She can follow-up with my partners Dr. Edna, hip specialist, if her pain persists    Cordella Rhein, MD, MS [facility] / [place] [phone]

## 2024-12-26 ENCOUNTER — Other Ambulatory Visit (HOSPITAL_COMMUNITY): Payer: Self-pay

## 2024-12-26 LAB — COMPREHENSIVE METABOLIC PANEL WITH GFR
ALT: 41 U/L (ref 0–44)
AST: 40 U/L (ref 15–41)
Albumin: 3.8 g/dL (ref 3.5–5.0)
Alkaline Phosphatase: 69 U/L (ref 38–126)
Anion gap: 10 (ref 5–15)
BUN: 7 mg/dL (ref 6–20)
CO2: 28 mmol/L (ref 22–32)
Calcium: 10.2 mg/dL (ref 8.9–10.3)
Chloride: 101 mmol/L (ref 98–111)
Creatinine, Ser: 0.78 mg/dL (ref 0.44–1.00)
GFR, Estimated: >60 mL/min
Glucose, Bld: 104 mg/dL — ABNORMAL HIGH (ref 70–99)
Potassium: 3.8 mmol/L (ref 3.5–5.1)
Sodium: 139 mmol/L (ref 135–145)
Total Bilirubin: 0.5 mg/dL (ref 0.0–1.2)
Total Protein: 8.1 g/dL (ref 6.5–8.1)

## 2024-12-26 LAB — CULTURE, BLOOD (ROUTINE X 2)
Culture: NO GROWTH
Culture: NO GROWTH
Special Requests: ADEQUATE
Special Requests: ADEQUATE

## 2024-12-26 LAB — PHOSPHORUS: Phosphorus: 3.2 mg/dL (ref 2.5–4.6)

## 2024-12-26 LAB — CBC WITH DIFFERENTIAL/PLATELET
Abs Immature Granulocytes: 0.02 K/uL (ref 0.00–0.07)
Basophils Absolute: 0 K/uL (ref 0.0–0.1)
Basophils Relative: 1 %
Eosinophils Absolute: 0.1 K/uL (ref 0.0–0.5)
Eosinophils Relative: 2 %
HCT: 38.1 % (ref 36.0–46.0)
Hemoglobin: 12.2 g/dL (ref 12.0–15.0)
Immature Granulocytes: 0 %
Lymphocytes Relative: 31 %
Lymphs Abs: 1.4 K/uL (ref 0.7–4.0)
MCH: 29 pg (ref 26.0–34.0)
MCHC: 32 g/dL (ref 30.0–36.0)
MCV: 90.5 fL (ref 80.0–100.0)
Monocytes Absolute: 0.7 K/uL (ref 0.1–1.0)
Monocytes Relative: 14 %
Neutro Abs: 2.4 K/uL (ref 1.7–7.7)
Neutrophils Relative %: 52 %
Platelets: 304 K/uL (ref 150–400)
RBC: 4.21 MIL/uL (ref 3.87–5.11)
RDW: 12.7 % (ref 11.5–15.5)
WBC: 4.5 K/uL (ref 4.0–10.5)
nRBC: 0 % (ref 0.0–0.2)

## 2024-12-26 LAB — GLUCOSE, CAPILLARY
Glucose-Capillary: 127 mg/dL — ABNORMAL HIGH (ref 70–99)
Glucose-Capillary: 103 mg/dL — ABNORMAL HIGH (ref 70–99)

## 2024-12-26 LAB — MAGNESIUM: Magnesium: 2.3 mg/dL (ref 1.7–2.4)

## 2024-12-26 MED ORDER — ONDANSETRON HCL 4 MG PO TABS
4.0000 mg | ORAL_TABLET | Freq: Four times a day (QID) | ORAL | 0 refills | Status: AC | PRN
  Filled 2024-12-26: qty 20, 5d supply, fill #0

## 2024-12-26 MED ORDER — NAPROXEN 250 MG PO TABS: 500.0000 mg | ORAL_TABLET | Freq: Two times a day (BID) | ORAL | Status: DC

## 2024-12-26 MED ORDER — SENNOSIDES-DOCUSATE SODIUM 8.6-50 MG PO TABS
1.0000 | ORAL_TABLET | Freq: Every day | ORAL | 0 refills | Status: AC
  Filled 2024-12-26: qty 30, 30d supply, fill #0

## 2024-12-26 MED ORDER — POLYETHYLENE GLYCOL 3350 17 GM/SCOOP PO POWD
17.0000 g | Freq: Every day | ORAL | 0 refills | Status: AC
  Filled 2024-12-26: qty 238, 14d supply, fill #0

## 2024-12-26 MED ORDER — NAPROXEN 500 MG PO TABS
500.0000 mg | ORAL_TABLET | Freq: Two times a day (BID) | ORAL | 0 refills | Status: AC
  Filled 2024-12-26: qty 10, 5d supply, fill #0

## 2024-12-26 MED ORDER — ACETAMINOPHEN 325 MG PO TABS
650.0000 mg | ORAL_TABLET | Freq: Four times a day (QID) | ORAL | 0 refills | Status: AC | PRN
  Filled 2024-12-26: qty 20, 3d supply, fill #0

## 2024-12-26 MED ORDER — OXYCODONE HCL 5 MG PO TABS
5.0000 mg | ORAL_TABLET | Freq: Four times a day (QID) | ORAL | 0 refills | Status: AC | PRN
  Filled 2024-12-26: qty 24, 3d supply, fill #0

## 2024-12-26 MED ORDER — PREDNISONE 50 MG PO TABS: 60.0000 mg | ORAL_TABLET | Freq: Every day | ORAL | Status: DC

## 2024-12-26 MED ORDER — LOSARTAN POTASSIUM 50 MG PO TABS
50.0000 mg | ORAL_TABLET | Freq: Every day | ORAL | 0 refills | Status: AC
  Filled 2024-12-26: qty 30, 30d supply, fill #0

## 2024-12-26 MED ORDER — METHOCARBAMOL 500 MG PO TABS
500.0000 mg | ORAL_TABLET | Freq: Two times a day (BID) | ORAL | 0 refills | Status: AC
  Filled 2024-12-26: qty 20, 10d supply, fill #0

## 2024-12-26 MED ORDER — PREDNISONE 10 MG (21) PO TBPK
ORAL_TABLET | ORAL | 0 refills | Status: AC
  Filled 2024-12-26: qty 21, 6d supply, fill #0

## 2024-12-26 NOTE — Progress Notes (Signed)
 Discharge Medications delivered from TOC meds to bed [facility] outpatient pharmacy by this RN.

## 2024-12-26 NOTE — Progress Notes (Signed)
 Physical Therapy Treatment Patient Details Name: Shelley Morrison MRN: [id number] DOB: 05-25-1966 Today's Date: 12/26/2024   History of Present Illness 58 yo female presents to therapy following hospital admission on 12/21/24 due to right hip pain.CT pelvis showed no acute fractures but shows degenerative changes in hip L>R and small effusion in R hip. CT lumbar showed multi-level degenerative disc disease.   PMH includes but is not limited to: DM II, elevated LFTs, HTN, lupus, RA,  cholecystectomy and fatty liver. R hip aspiration 12/26 and thus far no culture growth and effusion attributed to RA.     PT Comments   Pt admitted with above diagnosis.  Pt currently with functional limitations due to the deficits listed below (see PT Problem List). Pt in bed and agreeable to therapy intervention. Pt reports the IV pain medication continues to address her pain, PT provided pt ed on need to transition to PO pain medication for d/c home. Pt verbalized understanding. Pt is mod I for supine <> sit with use of gait belt as leg lifter, pt is mod I for sit to stand  from EOB with no verbal cues and PT set up for RW placement, pt reports navigating to and from the bathroom mod I with use of RW, PT adjusted RW for improved anatomical advantage to WB through B UE to offload R LE in stance phase with gait tasks and encouraged pt to perform step to pattern for 60 feet with S and min cues. Pt elected to return to bed and all needs in place. Pt will benefit from [facility] and RW in home setting.  Pt will benefit from acute skilled PT to increase their independence and safety with mobility to allow discharge.      If plan is discharge home, recommend the following: A little help with walking and/or transfers;A little help with bathing/dressing/bathroom;Assistance with cooking/housework;Assist for transportation;Help with stairs or ramp for entrance   Can travel by private vehicle        Equipment Recommendations  Rolling walker  (2 wheels)    Recommendations for Other Services       Precautions / Restrictions Precautions Precautions: Fall Restrictions Weight Bearing Restrictions Per Provider Order: No RLE Weight Bearing Per Provider Order: Weight bearing as tolerated Other Position/Activity Restrictions: no formal weight bearing restrictions, pt has poor tolerance to weightbearing on R leg     Mobility  Bed Mobility Overal bed mobility: Modified Independent             General bed mobility comments: supine <> sit with use of gait belt as leg lifter    Transfers Overall transfer level: Modified independent Equipment used: Rolling walker (2 wheels) Transfers: Sit to/from Stand Sit to Stand: Modified independent (Device/Increase time)           General transfer comment: no cues, from EOB set up for placement of RW and pt reports self transfering bed <> bathroom with use of Rw    Ambulation/Gait Ambulation/Gait assistance: Supervision Gait Distance (Feet): 60 Feet Assistive device: Rolling walker (2 wheels) Gait Pattern/deviations: Step-to pattern, Antalgic, Decreased stance time - right, Decreased weight shift to right, Trunk flexed Gait velocity: decreased     General Gait Details: PT lowered RW for improved B UE positioning in order to offload R LE when in stance phase and redirected pt to more normalized gait pattern with step to vs pt hopping to advace R LE for energy conservation as well. pt will benefit from RW in home setting  and pt reports pain increasing to 6/10 with mobility   Stairs             Wheelchair Mobility     Tilt Bed    Modified Rankin (Stroke Patients Only)       Balance Overall balance assessment: Needs assistance Sitting-balance support: Bilateral upper extremity supported, Feet supported Sitting balance-Leahy Scale: Good     Standing balance support: Bilateral upper extremity supported, During functional activity, Reliant on assistive device for  balance Standing balance-Leahy Scale: Fair Standing balance comment: static standing no UE support                            Communication Communication Communication: No apparent difficulties  Cognition Arousal: Alert Behavior During Therapy: WFL for tasks assessed/performed   PT - Cognitive impairments: No apparent impairments                         Following commands: Intact      Cueing Cueing Techniques: Verbal cues  Exercises      General Comments        Pertinent Vitals/Pain Pain Assessment Pain Assessment: 0-10 Pain Score: 6  Pain Location: R hip/groin/thigh Pain Descriptors / Indicators: Aching, Dull Pain Intervention(s): Limited activity within patient's tolerance, Monitored during session, Premedicated before session, Repositioned (pt declined CP)    Home Living                          Prior Function            PT Goals (current goals can now be found in the care plan section) Acute Rehab PT Goals Patient Stated Goal: bed able to move with less pain PT Goal Formulation: With patient Time For Goal Achievement: 01/05/25 Potential to Achieve Goals: Good Progress towards PT goals: Progressing toward goals    Frequency    Min 3X/week      PT Plan      Co-evaluation              AM-PAC PT 6 Clicks Mobility   Outcome Measure  Help needed turning from your back to your side while in a flat bed without using bedrails?: None Help needed moving from lying on your back to sitting on the side of a flat bed without using bedrails?: None Help needed moving to and from a bed to a chair (including a wheelchair)?: None Help needed standing up from a chair using your arms (e.g., wheelchair or bedside chair)?: A Little Help needed to walk in hospital room?: A Little Help needed climbing 3-5 steps with a railing? : Total 6 Click Score: 19    End of Session Equipment Utilized During Treatment: Gait belt Activity  Tolerance: Patient tolerated treatment well;No increased pain Patient left: with call bell/phone within reach;in bed Nurse Communication: Mobility status PT Visit Diagnosis: Muscle weakness (generalized) (M62.81);Pain;Difficulty in walking, not elsewhere classified (R26.2) Pain - Right/Left: Right Pain - part of body: Hip (thigh)     Time: 8975-8961 PT Time Calculation (min) (ACUTE ONLY): 14 min  Charges:    $Gait Training: 8-22 mins PT General Charges $$ ACUTE PT VISIT: 1 Visit                     [place], PT Acute Rehab    [place] VEAR Drone 12/26/2024, 11:15 AM

## 2024-12-26 NOTE — Progress Notes (Signed)
 Occupational Therapy Treatment Patient Details Name: Shelley Morrison MRN: [id number] DOB: 07-Jun-1966 Today's Date: 12/26/2024   History of present illness 58 yo female presents to therapy following hospital admission on 12/21/24 due to right hip pain.CT pelvis showed no acute fractures but shows degenerative changes in hip L>R and small effusion in R hip. CT lumbar showed multi-level degenerative disc disease.   PMH includes but is not limited to: DM II, elevated LFTs, HTN, lupus, RA,  cholecystectomy and fatty liver.   OT comments  Pt making excellent progress towards OT goals. Session focused on further problem solving of ADL/mobility mgmt at home and DME needs. Pt able to manage bed mobility with Modified Independence using AE, able to mobilize in room without AD w/o safety concerns though slow pace noted. Pt reports her work likely able to loan her any DME needed (RW and shower chair vs tub bench) and shows good safety awareness when it comes to fall prevention. Educated on gentle AROM exercises for RLE EOB/supine to complete within tolerance to improve ROM. Continue to recommend DC home w/ HHOT as needed.       If plan is discharge home, recommend the following:  Help with stairs or ramp for entrance;Assist for transportation;Assistance with cooking/housework;A little help with bathing/dressing/bathroom   Equipment Recommendations  Tub/shower bench;Other (comment) (RW; AE for LB ADLs)    Recommendations for Other Services      Precautions / Restrictions Precautions Precautions: Fall Restrictions Weight Bearing Restrictions Per Provider Order: No       Mobility Bed Mobility Overal bed mobility: Modified Independent             General bed mobility comments: use of gait belt as leg lifter    Transfers Overall transfer level: Modified independent Equipment used: None Transfers: Sit to/from Stand Sit to Stand: Modified independent (Device/Increase time)            General transfer comment: no AD to stand from bedside     Balance Overall balance assessment: Needs assistance Sitting-balance support: Bilateral upper extremity supported, Feet supported Sitting balance-Leahy Scale: Good     Standing balance support: No upper extremity supported, During functional activity Standing balance-Leahy Scale: Fair Standing balance comment: Fair+                           ADL either performed or assessed with clinical judgement   ADL Overall ADL's : Needs assistance/impaired                                     Functional mobility during ADLs: Contact guard assist General ADL Comments: Brought AE for LB ADL with pt reporting already practiced this and feels confident with it. Further discussed tub shower transfer considerations/placing shower chair close to tub edge w/ assist to step over with grab bar use vs sponge bathing as needed. Pt also reports her work likely able to give her access to DME (RW,shower chair). Discussed AROM BLE exercises to complete within tolerance to improve ROM. Able to walk around bedside twice without AD    Extremity/Trunk Assessment Upper Extremity Assessment Upper Extremity Assessment: Overall WFL for tasks assessed;Right hand dominant   Lower Extremity Assessment Lower Extremity Assessment: Defer to PT evaluation        Vision   Vision Assessment?: No apparent visual deficits   Perception     Praxis  Communication Communication Communication: No apparent difficulties   Cognition Arousal: Alert Behavior During Therapy: WFL for tasks assessed/performed Cognition: No apparent impairments                               Following commands: Intact        Cueing   Cueing Techniques: Verbal cues  Exercises      Shoulder Instructions       General Comments      Pertinent Vitals/ Pain       Pain Assessment Pain Assessment: Faces Faces Pain Scale: Hurts little  more Pain Location: R hip/groin/thigh Pain Descriptors / Indicators: Aching, Dull Pain Intervention(s): Monitored during session, Limited activity within patient's tolerance  Home Living Family/patient expects to be discharged to:: Private residence Living Arrangements: Spouse/significant other                                      Prior Functioning/Environment              Frequency  Min 2X/week        Progress Toward Goals  OT Goals(current goals can now be found in the care plan section)  Progress towards OT goals: Progressing toward goals  Acute Rehab OT Goals Patient Stated Goal: home soon OT Goal Formulation: With patient Time For Goal Achievement: 01/06/25 Potential to Achieve Goals: Good ADL Goals Pt Will Perform Lower Body Dressing: with supervision;with set-up;with adaptive equipment;sitting/lateral leans;sit to/from stand Pt Will Transfer to Toilet: with supervision;ambulating;grab bars Pt Will Perform Toileting - Clothing Manipulation and hygiene: with supervision;with set-up;sit to/from stand Additional ADL Goal #1: The pt will perform bed mobility with supervision, in prep for progressive ADL participation.  Plan      Co-evaluation                 AM-PAC OT 6 Clicks Daily Activity     Outcome Measure   Help from another person eating meals?: None Help from another person taking care of personal grooming?: A Little Help from another person toileting, which includes using toliet, bedpan, or urinal?: A Little Help from another person bathing (including washing, rinsing, drying)?: A Lot Help from another person to put on and taking off regular upper body clothing?: A Little Help from another person to put on and taking off regular lower body clothing?: A Lot 6 Click Score: 17    End of Session    OT Visit Diagnosis: Unsteadiness on feet (R26.81);Other abnormalities of gait and mobility (R26.89);Pain Pain - Right/Left:  Right Pain - part of body: Hip   Activity Tolerance Patient tolerated treatment well   Patient Left in bed;with call bell/phone within reach   Nurse Communication          Time: 8651-8592 OT Time Calculation (min): 19 min  Charges: OT General Charges $OT Visit: 1 Visit OT Treatments $Self Care/Home Management : 8-22 mins  Mliss NOVAK, OTR/L Acute Rehab Services Office: [phone]   Mliss Fish 12/26/2024, 2:13 PM

## 2024-12-26 NOTE — TOC Transition Note (Signed)
 Transition of Care [facility] - Discharge Note   Patient Details  Name: Shelley Morrison MRN: [id number] Date of Birth: [birth year]  Transition of [facility]) CM/SW Contact:  Jon ONEIDA Anon, RN Phone Number: 12/26/2024, 3:47 PM   Clinical Narrative:    Pt will  discharge home. PT/OT recommending HH services at discharge. ICM unable to secure [facility] PT/OT as agencies declined in the HUB:  [facility] [place] [facility] [place] [facility])  Declined -- [address], [place] [address] [phone] --  [facility])  Declined -- [address], [place] [address] [phone] --  [facility] [place] [facility])  Declined Inadequate staffing -- [address], [place] [address] [phone] --  [facility] [place] [facility])  Declined Out of network -- [address], [place] [address] [phone] --  [facility])  Declined Cost of care -- [address] [place] [address] [phone] --  [facility])  Declined Insurance not in network -- [address], [place] [address] [phone] --  [facility])  Declined Inadequate staffing -- [address], [place] [address] [phone] --   MD made aware of RNCM unable to secure [facility]. Referral for OP PT/OT sent.   RNCM met with pt at bedside to discuss OP Rehab and the recommended  DME. 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 Stating they will let her borrow the needed equipment. Pt states her family will provide transportation at DC. There are no further ICM needs at this time. ICM will sign off.   Final next level of care: OP Rehab Barriers to Discharge: Barriers Resolved   Patient Goals and CMS Choice Patient  states their goals for this hospitalization and ongoing recovery are:: Return home CMS Medicare.gov Compare Post Acute Care list provided to:: Other (Comment Required) (NA) Choice offered to / list presented to : NA [place] ownership interest in [facility].provided to:: Parent NA    Discharge Placement                       Discharge Plan and Services Additional resources added to the After Visit Summary for   In-house Referral: NA Discharge Planning Services: CM Consult Post Acute Care Choice: Durable Medical Equipment, Home Health          DME Arranged: N/A DME Agency: NA       HH Arranged: NA HH Agency: NA        Social Drivers of Health (SDOH) Interventions SDOH Screenings   Food Insecurity: No Food Insecurity (12/22/2024)  Housing: Low Risk (12/22/2024)  Transportation Needs: No Transportation Needs (12/22/2024)  Utilities: Not At Risk (12/22/2024)  Depression (PHQ2-9): Low Risk (01/23/2023)  Tobacco Use: Medium Risk (12/20/2024)     Readmission Risk Interventions    12/23/2024    3:39 PM  Readmission Risk Prevention Plan  Transportation Screening Complete  PCP or Specialist Appt within 5-7 Days Complete  Home Care Screening Complete  Medication Review (RN CM) Complete

## 2024-12-26 NOTE — Plan of Care (Signed)

## 2024-12-27 ENCOUNTER — Other Ambulatory Visit (HOSPITAL_COMMUNITY): Payer: Self-pay

## 2024-12-27 NOTE — Discharge Summary (Signed)
 " Physician Discharge Summary   Patient: Shelley Morrison MRN: [id number] DOB: 1966-09-07  Admit date:     12/21/2024  Discharge date: 12/26/2024  Discharge Physician: Alejandro Marker, DO   PCP: Dyane Anthony RAMAN, FNP   Recommendations at discharge:   Follow-up with PCP within 1 to 2 weeks repeat CBC, CMP, mag, Phos within 1 week Follow-up with orthopedic surgery in outpatient setting within 1 to 2 weeks Follow-up with rheumatology Dr. Dolphus within 1-2 weeks  Discharge Diagnoses: Principal Problem:   Intractable pain Active Problems:   Primary insomnia   Essential hypertension   Vitamin D  deficiency   Rheumatoid arthritis with rheumatoid factor of multiple sites without organ or systems involvement (HCC)   History of systemic lupus erythematosus (HCC)  Resolved Problems:   * No resolved hospital problems. *  Hospital Course: Shelley Morrison is a 58 y.o. year old female with medical history of hypertension, rheumatoid arthritis, insomnia presenting to the ED with right hip pain.  Pt states around 11 am yesterday she was walking and developed right hip pain. This continued to get worse. She describes it as sharp pain and states it is localized to the hip. It is severe up to 10/10. States she has not had any fevers or chills but was told she had a fever when she came here. Denies other symptoms on ROS. Denies any URI or UTI symptoms. She states she takes her medications regularly.   On arrival to the ED patient was noted to be HDS stable.  Lab work and imaging obtained.  CBC without leukocytosis and mild anemia near baseline.  ESR is 47, CRP 6.2.  CT pelvis shows no acute fractures but shows degenerative changes in the hip left greater than right with small effusion in the hip right greater than left.  CT lumbar spine showed multilevel degenerative disc disease and moderate to severe category.  Given this, [facility] contacted for admission.  Ortho consulted and recommending holding Abx until  Aspiration. IR aspirated and pending results but showing NG currently x 2. Had a slight Temp the night before last but will continue to hold on placing Abx.  Will continue to avoid placing on antibiotics.  She is improving and will transition to oral regimen as delineated.  She is medically stable for discharge only to follow-up with PCP, rheumatology and orthopedic surgery within 1 to 2 weeks.  Assessment and Plan:   Right Hip Pain: Patient patient with right hip severe pain. Pt admitted for pain control. Ddx include OA vs RA flare vs septic joint. Imaging shows arthritis in both hips. Effusions can be seen in OA and RA. Will consult orthopedic surgery and hold off Abx until aspiration. CRP was 6.2 and ESR was 47. Will start multimodal pain control and  she was Started on oxycodone  5 mg q6hrs prn but will increase to 5-10. IV Dilaudid  1 mg q2hrs as needed will go to every 4 as needed for breakthrough pain.  Normal renal function and no hx of PUD noted. Started robaxin  500 mg q4hrs. PT and OT consulted and they are recommending home health follow-up. IR aspirated Hip and showed:  Gram Stain ABUNDANT WBC PRESENT, PREDOMINANTLY PMN NO ORGANISMS SEEN  Culture NO GROWTH 4 DAYS NO ANAEROBES ISOLATED; CULTURE IN PROGRESS FOR 5 DAYS Performed at [facility], [address], [place], [place] [address]  -Per Orthopedic Surgery no overnight events and they agree make further recommendations once able to follow-up on results however if  they continue to show no growth today.  The orthopedic team feels that this pain is suspected to a rheumatoid arthritis flareup or regular osteoarthritis flareup and recommending that the patient can be discharged once her pain is fairly well-controlled on orals. -We will transition her off of the IV Dilaudid  and add IV ketorolac .  Adjusting pain further and guide her further narcotic pain prescription, start naproxen  500 mg p.o. twice daily for 7 days, prednisone  taper and  Robaxin .  She will need close outpatient follow-up with PCP, orthopedic surgery and rheumatology.   Febrile Episode: Patient with fever at 101.1 on admission and had another febrile episode of 100.4 but fevers have improved and resolved. Blood cultures ordered and showing NGTD @ 3 days. UA Negaive. Respiratory panel ordered.  Exactly unsure of the etiology but septic joint is lower on my differential currently but if continues to have fever will be higher on the differential. Holding APAP to monitor fever trend. If other workup is negative.  Orthopedic Surgery consulted and recommending aspiration and holding on antibiotics until this can be obtained; Aspiration done and pending results. Continue to Hold off Abx for now    Essential Hypertension: On losartan  50 mg and resumed. CTM BP per Protocol. Will use IV labetalol prn. Last BP reading was 125/80  Constipation: Start Bowel Regimen with Senna-Docusate 1 tab po BID, Miralax  17 mg po BID, and Bisacodyl  10 mg RC daily    Type 2 Diabetes Mellitus: Not on any pharmacotherapy.  Will start SSI. CBG Trend:  Recent Labs  Lab 12/24/24 2041 12/25/24 0811 12/25/24 1133 12/25/24 1602 12/25/24 2057 12/26/24 0840 12/26/24 1106  GLUCAP 147* 92 99 87 97 127* 103*  -HbA1c is now 6.2. CTM CBG's per Protocol   Rheumatoid Arthritis/History of SLE: Sees Rheumatology outpatient.  Outpatient records states patient should be on Sulfasalazine  500 mg twice daily and now resumed   Insomnia: No longer taking Trazodone  100 mg as needed at bedtime.   Palpitations: No longer on Atenolol as needed for Palpitations  Normocytic Anemia: Hgb/Hct Trend stable:  Recent Labs  Lab 12/21/24 1209 12/22/24 0541 12/23/24 0505 12/24/24 0548 12/26/24 0536  HGB 11.7* 11.8* 11.8* 11.5* 12.2  HCT 36.7 37.1 37.1 36.2 38.1  MCV 93.1 92.5 92.3 91.4 90.5  -Checked Anemia Panel and showed an iron level of 26, UIBC of 230, TIBC of 256, ferritin level of 438, folate level 7.5 and  vitamin B12 462. CTM for S/Sx of Bleeding; No overt bleeding noted -Repeat CBC intermittently and in the outpatient setting   Overweight: Complicates overall prognosis and care. Estimated body mass index is 26.82 kg/m as calculated from the following:   Height as of 12/20/24: 5' 10 (1.778 m).   Weight as of this encounter: 84.8 kg. Weight Loss and Dietary Counseling given  Consultants: Orthopedic Surgery; interventional radiology  Procedures performed: Hip aspiration   Disposition: Home health Diet recommendation:  Discharge Diet Orders (From admission, onward)     Start     Ordered   12/26/24 0000  Diet general        12/26/24 1455           Cardiac and Carb modified diet DISCHARGE MEDICATION: Allergies as of 12/26/2024   No Known Allergies      Medication List     STOP taking these medications    oxyCODONE -acetaminophen  5-325 MG tablet Commonly known as: PERCOCET/ROXICET       TAKE these medications    acetaminophen  325 MG tablet  Commonly known as: TYLENOL  Take 2 tablets (650 mg total) by mouth every 6 (six) hours as needed for fever or moderate pain (pain score 4-6) (or Fever >/= 101). What changed:  medication strength how much to take reasons to take this   atenolol 25 MG tablet Commonly known as: TENORMIN Take 25 mg by mouth daily as needed (HR over 110).   feeding supplement Liqd Take 237 mLs by mouth 2 (two) times daily between meals.   lidocaine  5 % Commonly known as: Lidoderm  Place 1 patch onto the skin daily. Remove & Discard patch within 12 hours or as directed by MD   losartan  50 MG tablet Commonly known as: COZAAR  Take 1 tablet (50 mg total) by mouth daily. What changed: when to take this   methocarbamol  500 MG tablet Commonly known as: ROBAXIN  Take 1 tablet (500 mg total) by mouth 2 (two) times daily.   naproxen  500 MG tablet Commonly known as: NAPROSYN  Take 1 tablet (500 mg total) by mouth 2 (two) times daily with a meal for  5 days.   ondansetron  4 MG tablet Commonly known as: ZOFRAN  Take 1 tablet (4 mg total) by mouth every 6 (six) hours as needed for nausea.   oxyCODONE  5 MG immediate release tablet Commonly known as: Oxy IR/ROXICODONE  Take 1-2 tablets (5-10 mg total) by mouth every 6 (six) hours as needed for up to 3 days for moderate pain (pain score 4-6).   polyethylene glycol powder 17 GM/SCOOP powder Commonly known as: GLYCOLAX /MIRALAX  Take 17 g by mouth daily.   predniSONE  10 MG (21) Tbpk tablet Commonly known as: STERAPRED UNI-PAK 21 TAB Take 6 tablets on day 1, 5 tablets on day 2, 4 tablets on day 3, 3 tablets on day 4, 2 tablets on day 5, 1 tablet on day 6 and then stop   Stool Softener/Laxative 50-8.6 MG tablet Generic drug: senna-docusate Take 1 tablet by mouth at bedtime.   sulfaSALAzine  500 MG tablet Commonly known as: AZULFIDINE  Take 500 mg by mouth 2 (two) times daily.   traZODone  100 MG tablet Commonly known as: DESYREL  Take 100 mg by mouth at bedtime as needed for sleep.   Vitamin D  50 MCG (2000 UT) Caps Take 1 capsule by mouth daily.        Follow-up Information     Dyane Anthony RAMAN, FNP. Call.   Specialty: Family Medicine Why: Follow up within 1-2 weeks Contact information: [address] [place] [address] [phone]         Dolphus Reiter, MD. Call.   Specialty: Rheumatology Why: Follow up within 1-2 weeks Contact information: [address] [place] [place] [address] [phone]         Reyne Cordella SQUIBB, MD. Call.   Specialty: Orthopedic Surgery Why: Follow up within 1-2 weeks Contact information: [address] [place] [address] [phone]                Discharge Exam: Filed Weights   12/24/24 0726 12/25/24 0649 12/26/24 0500  Weight: 84.9 kg 84.9 kg 84.8 kg   Vitals:   12/26/24 0446 12/26/24 1228  BP: (!) 159/86 (!) 143/94  Pulse: 83 (!) 106  Resp: 18 20  Temp: 98.7 F (37.1 C)  98.9 F (37.2 C)  SpO2: 97% 99%   Examination: Physical Exam:  Constitutional: WN/WD overweight African-American female who appears calm and in no acute distress Respiratory: Diminished to auscultation bilaterally, no wheezing, rales, rhonchi or crackles. Normal  respiratory effort and patient is not tachypenic. No accessory muscle use.  Unlabored breathing Cardiovascular: RRR, no murmurs / rubs / gallops. S1 and S2 auscultated. No extremity edema.  Abdomen: Soft, non-tender, distended secondary to body habitus. Bowel sounds positive.  GU: Deferred. Musculoskeletal: No clubbing / cyanosis of digits/nails. No joint deformity upper and lower extremities.  Skin: No rashes, lesions, ulcers on limited skin evaluation. No induration; Warm and dry.  Neurologic: CN 2-12 grossly intact with no focal deficits. Romberg sign and cerebellar reflexes not assessed.  Psychiatric: Normal judgment and insight. Alert and oriented x 3. Normal mood and appropriate affect.   Condition at discharge: stable  The results of significant diagnostics from this hospitalization (including imaging, microbiology, ancillary and laboratory) are listed below for reference.   Imaging Studies: IR Fluoro Guide Ndl Plmt / BX Result Date: 12/23/2024 INDICATION: Right hip pain of unknown etiology. CT pelvis 12/21/2024 demonstrates right hip joint effusion. EXAM: Fluoroscopy guided hip aspiration MEDICATIONS: None. COMPLICATIONS: None immediate. PROCEDURE: Informed written consent was obtained from the patient after a thorough discussion of the procedural risks, benefits and alternatives. All questions were addressed. Maximal Sterile Barrier Technique was utilized including caps, mask, sterile gowns, sterile gloves, sterile drape, hand hygiene and skin antiseptic. A timeout was performed prior to the initiation of the procedure. Fluoroscopy was used to visualize the right hip joint. Local anesthesia was achieved with 1% lidocaine .  Lidocaine  was infiltrated from the skin down to the neck of the right femur. A spinal needle was then advanced under fluoroscopic guidance to the femoral head/neck junction. Approximately 7 mL of cloudy yellow fluid was aspirated. Aspirate sent to pathology for culture and sensitivity. Needle was withdrawn. IMPRESSION: Satisfactory aspiration of 7 mL from the right hip joint under fluoroscopic guidance. Electronically Signed   By: Cordella Banner   On: 12/23/2024 10:31   CT PELVIS WO CONTRAST Result Date: 12/21/2024 CLINICAL DATA:  Pelvic fracture. EXAM: CT PELVIS WITHOUT CONTRAST TECHNIQUE: Multidetector CT imaging of the pelvis was performed following the standard protocol without intravenous contrast. RADIATION DOSE REDUCTION: This exam was performed according to the departmental dose-optimization program which includes automated exposure control, adjustment of the mA and/or kV according to patient size and/or use of iterative reconstruction technique. COMPARISON:  X-ray 12/20/2024 FINDINGS: Urinary Tract:  No abnormality visualized. Bowel:  Unremarkable visualized pelvic bowel loops. Vascular/Lymphatic: No pathologically enlarged lymph nodes. No significant vascular abnormality seen. Reproductive:  Hysterectomy.  There is no adnexal mass. Other:  No intraperitoneal free fluid. Musculoskeletal: SI joints and symphysis pubis unremarkable. No hip dislocation. Degenerative changes are noted in the hips, left greater than right. No femoral neck fracture. No sacral fracture. Small joint effusions evident, right greater than left IMPRESSION: 1. No evidence for an acute fracture in the bony anatomy of the pelvis. No femoral neck fracture. 2. Degenerative changes in the hips, left greater than right. 3. Small joint effusions in the hips, right greater than left. Electronically Signed   By: Camellia Candle M.D.   On: 12/21/2024 12:06   CT Lumbar Spine Wo Contrast Result Date: 12/21/2024 EXAM: CT OF THE LUMBAR SPINE  WITHOUT CONTRAST 12/21/2024 11:24:57 AM TECHNIQUE: CT of the lumbar spine was performed without the administration of intravenous contrast. Multiplanar reformatted images are provided for review. Automated exposure control, iterative reconstruction, and/or weight based adjustment of the mA/kV was utilized to reduce the radiation dose to as low as reasonably achievable. COMPARISON: CT abdomen and pelvis 01/22/2023. CLINICAL HISTORY: 58 year old female. Low  back pain, increased fracture risk. FINDINGS: BONES AND ALIGNMENT: Normal lumbar segmentation. Normal lordosis. No significant scoliosis or spondylolisthesis. Intact visible sacrum and SI joints. Normal vertebral body heights. No acute osseous abnormality. Background marrow signal is normal. SOFT TISSUES: Negative lumbar paraspinal soft tissues. Chronic cholecystectomy. Moderate calcified atherosclerosis of the abdominal aorta, and especially at the aortoiliac bifurcation. Stable other visible non-contrast abdominal viscera. DEGENERATIVE CHANGES: Lower thoracic facet hypertrophy at T11-T12 and T12-L1. No lower thoracic spinal stenosis. L1-L2: disc bulging  with mild posterior element hypertrophy. No spinal stenosis. L2-L3: Similar circumferential disc bulging and mild facet hypertrophy with mild superimposed epidural lipomatosis (series 11 image 60). Mild spinal stenosis. L3-L4: More pronounced disc space loss and bulky circumferential disc bulge. Mild to moderate posterior element hypertrophy. Mild spinal stenosis. Moderate bilateral L3 neural foraminal stenosis. L4-L5: Similar bulky circumferential disc bulge, moderate to severe facet and ligamentum flavum hypertrophy. Moderate to severe spinal stenosis (series 11 image 93). Moderate bilateral L4 neural foraminal stenosis. L5-S1: Circumferential disc bulging asymmetric to the left (series 11 image 106). Moderate facet hypertrophy. No significant spinal stenosis. Mild to moderate right L5 neural foraminal  stenosis. IMPRESSION: 1. No acute osseous abnormality in the lumbar spine. 2. Degenerative changes, worst at L4-L5 with moderate-severe multifactorial spinal stenosis. And multilevel up to moderate lumbar neural foraminal stenosis. Electronically signed by: Helayne Hurst MD 12/21/2024 11:47 AM EST RP Workstation: [id number]   DG Hip Unilat W or Wo Pelvis 2-3 Views Right Result Date: 12/20/2024 CLINICAL DATA:  Right hip pain. EXAM: DG HIP (WITH OR WITHOUT PELVIS) 2-3V RIGHT COMPARISON:  None Available. FINDINGS: No acute fracture or dislocation. Mild bilateral hip arthritic changes. The soft tissues are unremarkable. IMPRESSION: 1. No acute fracture or dislocation. 2. Mild bilateral hip arthritic changes. Electronically Signed   By: Vanetta Chou M.D.   On: 12/20/2024 17:38   Microbiology: Results for orders placed or performed during the hospital encounter of 12/21/24  Resp panel by RT-PCR (RSV, Flu A&B, Covid) Anterior Nasal Swab     Status: None   Collection Time: 12/21/24  6:19 PM   Specimen: Anterior Nasal Swab  Result Value Ref Range Status   SARS Coronavirus 2 by RT PCR NEGATIVE NEGATIVE Final    Comment: (NOTE) SARS-CoV-2 target nucleic acids are NOT DETECTED.  The SARS-CoV-2 RNA is generally detectable in upper respiratory specimens during the acute phase of infection. The lowest concentration of SARS-CoV-2 viral copies this assay can detect is 138 copies/mL. A negative result does not preclude SARS-Cov-2 infection and should not be used as the sole basis for treatment or other patient management decisions. A negative result may occur with  improper specimen collection/handling, submission of specimen other than nasopharyngeal swab, presence of viral mutation(s) within the areas targeted by this assay, and inadequate number of viral copies(<138 copies/mL). A negative result must be combined with clinical observations, patient history, and epidemiological information. The expected  result is Negative.  Fact Sheet for Patients:  bloggercourse.com  Fact Sheet for Healthcare Providers:  seriousbroker.it  This test is no t yet approved or cleared by the [place]  FDA and  has been authorized for detection and/or diagnosis of SARS-CoV-2 by FDA under an Emergency Use Authorization (EUA). This EUA will remain  in effect (meaning this test can be used) for the duration of the COVID-19 declaration under Section 564(b)(1) of the Act, 21 U.S.C.section 360bbb-3(b)(1), unless the authorization is terminated  or revoked sooner.       Influenza A by PCR  NEGATIVE NEGATIVE Final   Influenza B by PCR NEGATIVE NEGATIVE Final    Comment: (NOTE) The Xpert Xpress SARS-CoV-2/FLU/RSV plus assay is intended as an aid in the diagnosis of influenza from Nasopharyngeal swab specimens and should not be used as a sole basis for treatment. Nasal washings and aspirates are unacceptable for Xpert Xpress SARS-CoV-2/FLU/RSV testing.  Fact Sheet for Patients: bloggercourse.com  Fact Sheet for Healthcare Providers: seriousbroker.it  This test is not yet approved or cleared by the [place]  FDA and has been authorized for detection and/or diagnosis of SARS-CoV-2 by FDA under an Emergency Use Authorization (EUA). This EUA will remain in effect (meaning this test can be used) for the duration of the COVID-19 declaration under Section 564(b)(1) of the Act, 21 U.S.C. section 360bbb-3(b)(1), unless the authorization is terminated or revoked.     Resp Syncytial Virus by PCR NEGATIVE NEGATIVE Final    Comment: (NOTE) Fact Sheet for Patients: bloggercourse.com  Fact Sheet for Healthcare Providers: seriousbroker.it  This test is not yet approved or cleared by the [place]  FDA and has been authorized for detection and/or diagnosis of  SARS-CoV-2 by FDA under an Emergency Use Authorization (EUA). This EUA will remain in effect (meaning this test can be used) for the duration of the COVID-19 declaration under Section 564(b)(1) of the Act, 21 U.S.C. section 360bbb-3(b)(1), unless the authorization is terminated or revoked.  Performed at [facility], [address], [place], [place] [address]   Culture, blood (Routine X 2) w Reflex to ID Panel     Status: None   Collection Time: 12/21/24  7:29 PM   Specimen: BLOOD RIGHT ARM  Result Value Ref Range Status   Specimen Description   Final    BLOOD RIGHT ARM Performed at [facility], [address] [place], [place] [address]    Special Requests   Final    BOTTLES DRAWN AEROBIC AND ANAEROBIC Blood Culture adequate volume Performed at [facility], [address] [place], [place] [address]    Culture   Final    NO GROWTH 5 DAYS Performed at [facility], [address] [place], [place] [address]    Report Status 12/26/2024 FINAL  Final  Culture, blood (Routine X 2) w Reflex to ID Panel     Status: None   Collection Time: 12/21/24  7:36 PM   Specimen: BLOOD RIGHT FOREARM  Result Value Ref Range Status   Specimen Description   Final    BLOOD RIGHT FOREARM Performed at [facility], [address] [place], [place] [address]    Special Requests   Final    BOTTLES DRAWN AEROBIC AND ANAEROBIC Blood Culture adequate volume Performed at [facility], [address], [place], [place] [address]    Culture   Final    NO GROWTH 5 DAYS Performed at [facility], [address], [place], [place] [address]    Report Status 12/26/2024 FINAL  Final  Aerobic/Anaerobic Culture w Gram Stain (surgical/deep wound)     Status: None (Preliminary result)   Collection Time: 12/23/24 10:15 AM   Specimen: Joint, Right Hip; Tissue  Result Value Ref Range Status   Specimen Description   Final    JOINT  FLUID Performed at [facility] [address], [place], [place] [address]    Special Requests   Final    RIGHT HIP Performed at [facility], [address], [place], [place] [address]    Gram Stain   Final    ABUNDANT WBC PRESENT, PREDOMINANTLY PMN NO ORGANISMS SEEN    Culture   Final    NO GROWTH 4 DAYS NO ANAEROBES ISOLATED; CULTURE IN PROGRESS FOR 5 DAYS Performed at [facility], [address] [place], [place] [address]    Report Status PENDING  Incomplete   Labs: CBC: Recent Labs  Lab 12/21/24 1209 12/22/24 0541 12/23/24 0505 12/24/24 0548 12/26/24 0536  WBC 10.3 7.1 7.1 6.5 4.5  NEUTROABS  --   --  3.9 3.4 2.4  HGB 11.7* 11.8* 11.8* 11.5* 12.2  HCT 36.7 37.1 37.1 36.2 38.1  MCV 93.1 92.5 92.3 91.4 90.5  PLT 217 210 216 234 304   Basic Metabolic Panel: Recent Labs  Lab 12/21/24 1209 12/21/24 1922 12/22/24 0541 12/23/24 0505 12/24/24 0548 12/26/24 0536  NA 136  --  138 138 138 139  K 3.8  --  3.9 3.8 4.1 3.8  CL 106  --  105 101 102 101  CO2 22  --  23 27 27 28   GLUCOSE 123*  --  116* 84 92 104*  BUN 14  --  10 9 11 7   CREATININE 0.77  --  0.78 0.74 0.90 0.78  CALCIUM 9.4  --  9.4 9.9 9.9 10.2  MG  --  2.0  --  2.5* 2.3 2.3  PHOS  --   --   --  2.8 3.2 3.2   Liver Function Tests: Recent Labs  Lab 12/23/24 0505 12/24/24 0548 12/26/24 0536  AST 26 26 40  ALT 27 25 41  ALKPHOS 67 66 69  BILITOT 0.5 0.4 0.5  PROT 7.7 7.8 8.1  ALBUMIN 3.6 3.6 3.8   CBG: Recent Labs  Lab 12/25/24 1133 12/25/24 1602 12/25/24 2057 12/26/24 0840 12/26/24 1106  GLUCAP 99 87 97 127* 103*   Discharge time spent: greater than 30 minutes.  Signed: Alejandro Marker, DO [facility] 12/27/2024 "

## 2024-12-28 LAB — AEROBIC/ANAEROBIC CULTURE W GRAM STAIN (SURGICAL/DEEP WOUND): Culture: NO GROWTH

## 2025-01-03 ENCOUNTER — Telehealth: Payer: Self-pay | Admitting: Rheumatology

## 2025-01-03 NOTE — Telephone Encounter (Signed)
 Contacted patient and got scheduled for 01/23/2025.

## 2025-01-03 NOTE — Telephone Encounter (Signed)
 Pt called stating she was just discharged from the hospital. Pt was wondering if she should come in sooner than her appt in march.

## 2025-01-03 NOTE — Telephone Encounter (Signed)
 You may offer an earlier appointment if there are any openings.

## 2025-01-09 NOTE — Progress Notes (Unsigned)
 "  Office Visit Note  Patient: Shelley Morrison             Date of Birth: 1966/03/06           MRN: [id number]             PCP: Dyane Anthony RAMAN, FNP Referring: Dyane Anthony RAMAN, FNP Visit Date: 01/23/2025 Occupation: Data Unavailable  Subjective:  No chief complaint on file.   History of Present Illness: Shelley Morrison is a 59 y.o. female ***   Patient was discharged from the hospital on December 26, 2024 after being hospitalized for severe pain in the right hip.  Her sed rate was elevated at 47 and CRP 6.2.  She was given Dilaudid , IV ketorolac  and muscle relaxers.  She was discharged on Naprosyn .  She also had a febrile episode in the hospital.  Activities of Daily Living:  Patient reports morning stiffness for *** {minute/hour:19697}.   Patient {ACTIONS;DENIES/REPORTS:21021675::Denies} nocturnal pain.  Difficulty dressing/grooming: {ACTIONS;DENIES/REPORTS:21021675::Denies} Difficulty climbing stairs: {ACTIONS;DENIES/REPORTS:21021675::Denies} Difficulty getting out of chair: {ACTIONS;DENIES/REPORTS:21021675::Denies} Difficulty using hands for taps, buttons, cutlery, and/or writing: {ACTIONS;DENIES/REPORTS:21021675::Denies}  No Rheumatology ROS completed.   PMFS History:  Patient Active Problem List   Diagnosis Date Noted   Intractable pain 12/21/2024   Dehydration 10/28/2024   Generalized weakness 10/28/2024   Intractable nausea and vomiting 10/28/2024   History of systemic lupus erythematosus (HCC) 10/28/2024   History of rheumatoid arthritis 10/28/2024   AKI (acute kidney injury) 10/27/2024   Uterine fibroid 04/30/2023   Rheumatoid arthritis with rheumatoid factor of multiple sites without organ or systems involvement (HCC) 06/19/2017   High risk medication use 06/18/2017   Vitamin D  deficiency 06/18/2017   Primary insomnia 05/19/2017   Essential hypertension 05/19/2017   ANA positive 05/08/2017   Rheumatoid factor positive 05/08/2017    Past Medical History:   Diagnosis Date   Diabetes type 2 (HCC) 08/12/2024   Elevated LFTs    Follows w/ [facility].   Fatty liver    Follows with [facility].   History of adenomatous polyp of colon    Hypertension    Follows w/ [facility].   Lupus 2018   Follows with [facility].   Rheumatoid arthritis (HCC)    seropositive rheumatoid arthritis, follows with [facility]   Wears contact lenses    Wears dentures    full set   Wears glasses     Family History  Problem Relation Age of Onset   Healthy Son    Healthy Daughter    Past Surgical History:  Procedure Laterality Date   COLONOSCOPY  12/21/2022   with [facility]   IR FLUORO GUIDED NEEDLE PLC ASPIRATION/INJECTION LOC  12/23/2024   LAPAROSCOPIC CHOLECYSTECTOMY  1997   ROBOTIC ASSISTED LAPAROSCOPIC HYSTERECTOMY AND SALPINGECTOMY Bilateral 04/30/2023   Procedure: ABORTED XI ROBOTIC ASSISTED LAPAROSCOPIC HYSTERECTOMY AND SALPINGECTOMY;  Surgeon: Darcel Pool, MD;  Location: [facility];  Service: Gynecology;  Laterality: Bilateral;   VAGINAL HYSTERECTOMY N/A 04/30/2023   Procedure: HYSTERECTOMY VAGINAL WITH MYOMECTOMY AND  BILATERAL SALPINGECTOMY;  Surgeon: Darcel Pool, MD;  Location: [facility] ;  Service: Gynecology;  Laterality: N/A;   Social History[1] Social History   Social History Narrative   Not on file     Immunization History  Administered Date(s) Administered   PFIZER(Purple Top)SARS-COV-2 Vaccination 09/24/2020, 10/15/2020     Objective: Vital Signs: LMP 09/14/2017    Physical Exam   Musculoskeletal Exam: ***  CDAI Exam:  CDAI Score: -- Patient Global: --; Provider Global: -- Swollen: --; Tender: -- Joint Exam 01/23/2025   No joint exam has been documented for this visit   There is currently no information documented on the homunculus. Go to the Rheumatology activity and complete the homunculus joint exam.  Investigation: No additional  findings.  Imaging: IR Fluoro Guide Ndl Plmt / BX Result Date: 12/23/2024 INDICATION: Right hip pain of unknown etiology. CT pelvis 12/21/2024 demonstrates right hip joint effusion. EXAM: Fluoroscopy guided hip aspiration MEDICATIONS: None. COMPLICATIONS: None immediate. PROCEDURE: Informed written consent was obtained from the patient after a thorough discussion of the procedural risks, benefits and alternatives. All questions were addressed. Maximal Sterile Barrier Technique was utilized including caps, mask, sterile gowns, sterile gloves, sterile drape, hand hygiene and skin antiseptic. A timeout was performed prior to the initiation of the procedure. Fluoroscopy was used to visualize the right hip joint. Local anesthesia was achieved with 1% lidocaine . Lidocaine  was infiltrated from the skin down to the neck of the right femur. A spinal needle was then advanced under fluoroscopic guidance to the femoral head/neck junction. Approximately 7 mL of cloudy yellow fluid was aspirated. Aspirate sent to pathology for culture and sensitivity. Needle was withdrawn. IMPRESSION: Satisfactory aspiration of 7 mL from the right hip joint under fluoroscopic guidance. Electronically Signed   By: Cordella Banner   On: 12/23/2024 10:31   CT PELVIS WO CONTRAST Result Date: 12/21/2024 CLINICAL DATA:  Pelvic fracture. EXAM: CT PELVIS WITHOUT CONTRAST TECHNIQUE: Multidetector CT imaging of the pelvis was performed following the standard protocol without intravenous contrast. RADIATION DOSE REDUCTION: This exam was performed according to the departmental dose-optimization program which includes automated exposure control, adjustment of the mA and/or kV according to patient size and/or use of iterative reconstruction technique. COMPARISON:  X-ray 12/20/2024 FINDINGS: Urinary Tract:  No abnormality visualized. Bowel:  Unremarkable visualized pelvic bowel loops. Vascular/Lymphatic: No pathologically enlarged lymph nodes. No  significant vascular abnormality seen. Reproductive:  Hysterectomy.  There is no adnexal mass. Other:  No intraperitoneal free fluid. Musculoskeletal: SI joints and symphysis pubis unremarkable. No hip dislocation. Degenerative changes are noted in the hips, left greater than right. No femoral neck fracture. No sacral fracture. Small joint effusions evident, right greater than left IMPRESSION: 1. No evidence for an acute fracture in the bony anatomy of the pelvis. No femoral neck fracture. 2. Degenerative changes in the hips, left greater than right. 3. Small joint effusions in the hips, right greater than left. Electronically Signed   By: Camellia Candle M.D.   On: 12/21/2024 12:06   CT Lumbar Spine Wo Contrast Result Date: 12/21/2024 EXAM: CT OF THE LUMBAR SPINE WITHOUT CONTRAST 12/21/2024 11:24:57 AM TECHNIQUE: CT of the lumbar spine was performed without the administration of intravenous contrast. Multiplanar reformatted images are provided for review. Automated exposure control, iterative reconstruction, and/or weight based adjustment of the mA/kV was utilized to reduce the radiation dose to as low as reasonably achievable. COMPARISON: CT abdomen and pelvis 01/22/2023. CLINICAL HISTORY: 59 year old female. Low back pain, increased fracture risk. FINDINGS: BONES AND ALIGNMENT: Normal lumbar segmentation. Normal lordosis. No significant scoliosis or spondylolisthesis. Intact visible sacrum and SI joints. Normal vertebral body heights. No acute osseous abnormality. Background marrow signal is normal. SOFT TISSUES: Negative lumbar paraspinal soft tissues. Chronic cholecystectomy. Moderate calcified atherosclerosis of the abdominal aorta, and especially at the aortoiliac bifurcation. Stable other visible non-contrast abdominal viscera. DEGENERATIVE CHANGES: Lower thoracic facet hypertrophy at T11-T12 and T12-L1. No lower thoracic spinal  stenosis. L1-L2: disc bulging  with mild posterior element hypertrophy. No  spinal stenosis. L2-L3: Similar circumferential disc bulging and mild facet hypertrophy with mild superimposed epidural lipomatosis (series 11 image 60). Mild spinal stenosis. L3-L4: More pronounced disc space loss and bulky circumferential disc bulge. Mild to moderate posterior element hypertrophy. Mild spinal stenosis. Moderate bilateral L3 neural foraminal stenosis. L4-L5: Similar bulky circumferential disc bulge, moderate to severe facet and ligamentum flavum hypertrophy. Moderate to severe spinal stenosis (series 11 image 93). Moderate bilateral L4 neural foraminal stenosis. L5-S1: Circumferential disc bulging asymmetric to the left (series 11 image 106). Moderate facet hypertrophy. No significant spinal stenosis. Mild to moderate right L5 neural foraminal stenosis. IMPRESSION: 1. No acute osseous abnormality in the lumbar spine. 2. Degenerative changes, worst at L4-L5 with moderate-severe multifactorial spinal stenosis. And multilevel up to moderate lumbar neural foraminal stenosis. Electronically signed by: Helayne Hurst MD 12/21/2024 11:47 AM EST RP Workstation: [id number]   DG Hip Unilat W or Wo Pelvis 2-3 Views Right Result Date: 12/20/2024 CLINICAL DATA:  Right hip pain. EXAM: DG HIP (WITH OR WITHOUT PELVIS) 2-3V RIGHT COMPARISON:  None Available. FINDINGS: No acute fracture or dislocation. Mild bilateral hip arthritic changes. The soft tissues are unremarkable. IMPRESSION: 1. No acute fracture or dislocation. 2. Mild bilateral hip arthritic changes. Electronically Signed   By: Vanetta Chou M.D.   On: 12/20/2024 17:38    Recent Labs: Lab Results  Component Value Date   WBC 4.5 12/26/2024   HGB 12.2 12/26/2024   PLT 304 12/26/2024   NA 139 12/26/2024   K 3.8 12/26/2024   CL 101 12/26/2024   CO2 28 12/26/2024   GLUCOSE 104 (H) 12/26/2024   BUN 7 12/26/2024   CREATININE 0.78 12/26/2024   BILITOT 0.5 12/26/2024   ALKPHOS 69 12/26/2024   AST 40 12/26/2024   ALT 41 12/26/2024   PROT  8.1 12/26/2024   ALBUMIN 3.8 12/26/2024   CALCIUM 10.2 12/26/2024   GFRAA 82 07/03/2021   QFTBGOLDPLUS NEGATIVE 08/16/2021    Speciality Comments: PLQ Eye Exam:11/25/2021 WNL @ [facility] Follow up in 6 months, methotrexate -fatigue nausea and brain fog Arava -hair loss, inadequate response  Procedures:  No procedures performed Allergies: Patient has no known allergies.   Assessment / Plan:     Visit Diagnoses: No diagnosis found.  Orders: No orders of the defined types were placed in this encounter.  No orders of the defined types were placed in this encounter.   Face-to-face time spent with patient was *** minutes. Greater than 50% of time was spent in counseling and coordination of care.  Follow-Up Instructions: No follow-ups on file.   Maya Nash, MD  Note - This record has been created using Animal nutritionist.  Chart creation errors have been sought, but may not always  have been located. Such creation errors do not reflect on  the standard of medical care.    [1]  Social History Tobacco Use   Smoking status: Former    Current packs/day: 0.00    Average packs/day: 0.8 packs/day for 10.0 years (7.5 ttl pk-yrs)    Types: Cigarettes    Start date: 07/14/1994    Quit date: 07/14/2004    Years since quitting: 20.5    Passive exposure: Never   Smokeless tobacco: Never  Vaping Use   Vaping status: Never Used  Substance Use Topics   Alcohol use: Not Currently   Drug use: Never   "

## 2025-01-23 ENCOUNTER — Ambulatory Visit: Admitting: Rheumatology

## 2025-01-23 DIAGNOSIS — Z79899 Other long term (current) drug therapy: Secondary | ICD-10-CM

## 2025-01-23 DIAGNOSIS — F5101 Primary insomnia: Secondary | ICD-10-CM

## 2025-01-23 DIAGNOSIS — M7062 Trochanteric bursitis, left hip: Secondary | ICD-10-CM

## 2025-01-23 DIAGNOSIS — M0579 Rheumatoid arthritis with rheumatoid factor of multiple sites without organ or systems involvement: Secondary | ICD-10-CM

## 2025-01-23 DIAGNOSIS — R7989 Other specified abnormal findings of blood chemistry: Secondary | ICD-10-CM

## 2025-01-23 DIAGNOSIS — M1612 Unilateral primary osteoarthritis, left hip: Secondary | ICD-10-CM

## 2025-01-23 DIAGNOSIS — E785 Hyperlipidemia, unspecified: Secondary | ICD-10-CM

## 2025-01-23 DIAGNOSIS — Z8679 Personal history of other diseases of the circulatory system: Secondary | ICD-10-CM

## 2025-01-23 DIAGNOSIS — Z8639 Personal history of other endocrine, nutritional and metabolic disease: Secondary | ICD-10-CM

## 2025-01-23 DIAGNOSIS — M19042 Primary osteoarthritis, left hand: Secondary | ICD-10-CM

## 2025-01-23 DIAGNOSIS — M19072 Primary osteoarthritis, left ankle and foot: Secondary | ICD-10-CM

## 2025-03-24 ENCOUNTER — Ambulatory Visit: Admitting: Rheumatology
